# Patient Record
Sex: Female | Born: 1970
Health system: Southern US, Community
[De-identification: ages and names within clinical notes are randomized; demographics above are authoritative.]

## PROBLEM LIST (undated history)

## (undated) DIAGNOSIS — E119 Type 2 diabetes mellitus without complications: Secondary | ICD-10-CM

## (undated) DIAGNOSIS — T7840XA Allergy, unspecified, initial encounter: Secondary | ICD-10-CM

## (undated) DIAGNOSIS — G4733 Obstructive sleep apnea (adult) (pediatric): Principal | ICD-10-CM

## (undated) DIAGNOSIS — I10 Essential (primary) hypertension: Secondary | ICD-10-CM

## (undated) DIAGNOSIS — E785 Hyperlipidemia, unspecified: Secondary | ICD-10-CM

## (undated) HISTORY — PX: REDUCTION MAMMAPLASTY: SUR839

## (undated) HISTORY — DX: Essential (primary) hypertension: I10

## (undated) HISTORY — DX: Obstructive sleep apnea (adult) (pediatric): G47.33

## (undated) HISTORY — DX: Hyperlipidemia, unspecified: E78.5

## (undated) HISTORY — PX: ABDOMINAL HYSTERECTOMY: SHX81

## (undated) HISTORY — DX: Allergy, unspecified, initial encounter: T78.40XA

## (undated) HISTORY — DX: Type 2 diabetes mellitus without complications: E11.9

---

## 1997-07-25 ENCOUNTER — Other Ambulatory Visit: Admission: RE | Admit: 1997-07-25 | Discharge: 1997-07-25 | Payer: Self-pay | Admitting: Surgery

## 1998-04-21 ENCOUNTER — Other Ambulatory Visit: Admission: RE | Admit: 1998-04-21 | Discharge: 1998-04-21 | Payer: Self-pay | Admitting: Obstetrics & Gynecology

## 1999-08-07 ENCOUNTER — Other Ambulatory Visit: Admission: RE | Admit: 1999-08-07 | Discharge: 1999-08-07 | Payer: Self-pay | Admitting: Obstetrics and Gynecology

## 2000-09-01 ENCOUNTER — Other Ambulatory Visit: Admission: RE | Admit: 2000-09-01 | Discharge: 2000-09-01 | Payer: Self-pay | Admitting: Obstetrics and Gynecology

## 2001-04-15 HISTORY — PX: BREAST REDUCTION SURGERY: SHX8

## 2001-04-15 HISTORY — PX: REDUCTION MAMMAPLASTY: SUR839

## 2001-12-15 ENCOUNTER — Other Ambulatory Visit: Admission: RE | Admit: 2001-12-15 | Discharge: 2001-12-15 | Payer: Self-pay | Admitting: Obstetrics and Gynecology

## 2002-03-08 ENCOUNTER — Ambulatory Visit (HOSPITAL_BASED_OUTPATIENT_CLINIC_OR_DEPARTMENT_OTHER): Admission: RE | Admit: 2002-03-08 | Discharge: 2002-03-09 | Payer: Self-pay | Admitting: Specialist

## 2002-04-15 HISTORY — PX: TOE SURGERY: SHX1073

## 2002-09-27 ENCOUNTER — Encounter: Payer: Self-pay | Admitting: Obstetrics and Gynecology

## 2002-09-27 ENCOUNTER — Ambulatory Visit (HOSPITAL_COMMUNITY): Admission: RE | Admit: 2002-09-27 | Discharge: 2002-09-27 | Payer: Self-pay | Admitting: Obstetrics and Gynecology

## 2002-10-11 ENCOUNTER — Ambulatory Visit (HOSPITAL_COMMUNITY): Admission: RE | Admit: 2002-10-11 | Discharge: 2002-10-11 | Payer: Self-pay | Admitting: Obstetrics and Gynecology

## 2003-01-10 ENCOUNTER — Other Ambulatory Visit: Admission: RE | Admit: 2003-01-10 | Discharge: 2003-01-10 | Payer: Self-pay | Admitting: Obstetrics and Gynecology

## 2003-04-16 HISTORY — PX: PARTIAL HYSTERECTOMY: SHX80

## 2003-07-12 ENCOUNTER — Ambulatory Visit (HOSPITAL_COMMUNITY): Admission: RE | Admit: 2003-07-12 | Discharge: 2003-07-12 | Payer: Self-pay | Admitting: Obstetrics and Gynecology

## 2003-10-13 ENCOUNTER — Inpatient Hospital Stay (HOSPITAL_COMMUNITY): Admission: RE | Admit: 2003-10-13 | Discharge: 2003-10-16 | Payer: Self-pay | Admitting: Obstetrics and Gynecology

## 2004-04-03 ENCOUNTER — Other Ambulatory Visit: Admission: RE | Admit: 2004-04-03 | Discharge: 2004-04-03 | Payer: Self-pay | Admitting: Obstetrics and Gynecology

## 2005-04-04 ENCOUNTER — Other Ambulatory Visit: Admission: RE | Admit: 2005-04-04 | Discharge: 2005-04-04 | Payer: Self-pay | Admitting: Obstetrics and Gynecology

## 2010-05-09 ENCOUNTER — Other Ambulatory Visit (HOSPITAL_COMMUNITY): Payer: Self-pay | Admitting: Surgery

## 2010-05-15 ENCOUNTER — Ambulatory Visit (HOSPITAL_COMMUNITY)
Admission: RE | Admit: 2010-05-15 | Discharge: 2010-05-15 | Payer: Self-pay | Source: Home / Self Care | Attending: Surgery | Admitting: Surgery

## 2010-05-15 ENCOUNTER — Encounter
Admission: RE | Admit: 2010-05-15 | Discharge: 2010-05-15 | Payer: Self-pay | Source: Home / Self Care | Attending: Surgery | Admitting: Surgery

## 2010-05-18 ENCOUNTER — Ambulatory Visit (HOSPITAL_COMMUNITY)
Admission: RE | Admit: 2010-05-18 | Discharge: 2010-05-18 | Disposition: A | Discharge status: Home / Self Care | Payer: BC Managed Care – PPO | Source: Ambulatory Visit | Attending: Surgery | Admitting: Surgery

## 2010-05-18 DIAGNOSIS — Z1231 Encounter for screening mammogram for malignant neoplasm of breast: Secondary | ICD-10-CM

## 2012-02-05 ENCOUNTER — Other Ambulatory Visit (HOSPITAL_COMMUNITY): Payer: Self-pay | Admitting: Internal Medicine

## 2012-02-05 ENCOUNTER — Encounter: Payer: Self-pay | Admitting: Obstetrics and Gynecology

## 2012-02-05 ENCOUNTER — Ambulatory Visit (INDEPENDENT_AMBULATORY_CARE_PROVIDER_SITE_OTHER): Payer: BC Managed Care – PPO | Admitting: Obstetrics and Gynecology

## 2012-02-05 VITALS — BP 120/80 | HR 72 | Resp 16 | Ht 63.0 in | Wt 263.0 lb

## 2012-02-05 DIAGNOSIS — N898 Other specified noninflammatory disorders of vagina: Secondary | ICD-10-CM

## 2012-02-05 DIAGNOSIS — Z1231 Encounter for screening mammogram for malignant neoplasm of breast: Secondary | ICD-10-CM

## 2012-02-05 DIAGNOSIS — Z01419 Encounter for gynecological examination (general) (routine) without abnormal findings: Secondary | ICD-10-CM

## 2012-02-05 DIAGNOSIS — N899 Noninflammatory disorder of vagina, unspecified: Secondary | ICD-10-CM

## 2012-02-05 DIAGNOSIS — Z139 Encounter for screening, unspecified: Secondary | ICD-10-CM

## 2012-02-05 DIAGNOSIS — Z90710 Acquired absence of both cervix and uterus: Secondary | ICD-10-CM

## 2012-02-05 LAB — POCT WET PREP (WET MOUNT)

## 2012-02-05 LAB — HIV ANTIBODY (ROUTINE TESTING W REFLEX): HIV: NONREACTIVE

## 2012-02-05 MED ORDER — CLINDAMYCIN HCL 300 MG PO CAPS: 300.0000 mg | ORAL_CAPSULE | Freq: Every day | ORAL | Status: DC

## 2012-02-05 MED ORDER — CLOTRIMAZOLE-BETAMETHASONE 1-0.05 % EX CREA: TOPICAL_CREAM | Freq: Every day | CUTANEOUS | Status: DC

## 2012-02-05 NOTE — Progress Notes (Signed)
Contraception Hysterectomy Last pap 09/16/2008 WNL Last Mammo 2012 WNl Last Colonoscopy None Last Dexa Scan None Primary MD Dorothyann Peng Abuse at Home None  C/o vaginal irritation externally  Filed Vitals:   02/05/12 1024  BP: 120/80  Pulse: 72  Resp: 16   ROS: noncontributory  Physical Examination: General appearance - alert, well appearing, and in no distress Neck - supple, no significant adenopathy Chest - clear to auscultation, no wheezes, rales or rhonchi, symmetric air entry Heart - normal rate and regular rhythm Abdomen - soft, nontender, nondistended, no masses or organomegaly Breasts - breasts appear normal, no suspicious masses, no skin or nipple changes or axillary nodes Pelvic - normal external genitalia, vulva, vagina, adnexa Back exam - no CVAT Extremities - no edema, redness or tenderness in the calves or thighs  A/P mammo AEX 3yr Boils in armpit/hidradenitis - never tried abx chronically - trial of clindamycin Wet prep Vulvitis - lotrisone

## 2012-02-19 ENCOUNTER — Ambulatory Visit (HOSPITAL_COMMUNITY)
Admission: RE | Admit: 2012-02-19 | Discharge: 2012-02-19 | Disposition: A | Discharge status: Home / Self Care | Payer: BC Managed Care – PPO | Source: Ambulatory Visit | Attending: Internal Medicine | Admitting: Internal Medicine

## 2012-02-19 DIAGNOSIS — Z1231 Encounter for screening mammogram for malignant neoplasm of breast: Secondary | ICD-10-CM | POA: Insufficient documentation

## 2013-03-25 ENCOUNTER — Other Ambulatory Visit (HOSPITAL_COMMUNITY): Payer: Self-pay | Admitting: Internal Medicine

## 2013-03-25 DIAGNOSIS — Z1231 Encounter for screening mammogram for malignant neoplasm of breast: Secondary | ICD-10-CM

## 2013-03-26 ENCOUNTER — Ambulatory Visit (HOSPITAL_COMMUNITY)
Admission: RE | Admit: 2013-03-26 | Discharge: 2013-03-26 | Disposition: A | Discharge status: Home / Self Care | Payer: BC Managed Care – PPO | Source: Ambulatory Visit | Attending: Internal Medicine | Admitting: Internal Medicine

## 2013-03-26 DIAGNOSIS — Z1231 Encounter for screening mammogram for malignant neoplasm of breast: Secondary | ICD-10-CM

## 2014-07-06 ENCOUNTER — Other Ambulatory Visit: Payer: Self-pay

## 2014-07-06 DIAGNOSIS — Z1231 Encounter for screening mammogram for malignant neoplasm of breast: Secondary | ICD-10-CM

## 2014-07-14 ENCOUNTER — Ambulatory Visit
Admission: RE | Admit: 2014-07-14 | Discharge: 2014-07-14 | Disposition: A | Discharge status: Home / Self Care | Payer: BC Managed Care – PPO | Source: Ambulatory Visit

## 2014-07-14 DIAGNOSIS — Z1231 Encounter for screening mammogram for malignant neoplasm of breast: Secondary | ICD-10-CM

## 2015-09-06 ENCOUNTER — Other Ambulatory Visit: Payer: Self-pay

## 2015-09-06 DIAGNOSIS — Z1231 Encounter for screening mammogram for malignant neoplasm of breast: Secondary | ICD-10-CM

## 2015-09-21 ENCOUNTER — Ambulatory Visit
Admission: RE | Admit: 2015-09-21 | Discharge: 2015-09-21 | Disposition: A | Discharge status: Home / Self Care | Payer: BC Managed Care – PPO | Source: Ambulatory Visit

## 2015-09-21 DIAGNOSIS — Z1231 Encounter for screening mammogram for malignant neoplasm of breast: Secondary | ICD-10-CM

## 2016-05-16 ENCOUNTER — Other Ambulatory Visit (HOSPITAL_BASED_OUTPATIENT_CLINIC_OR_DEPARTMENT_OTHER): Payer: Self-pay

## 2016-05-16 DIAGNOSIS — G4730 Sleep apnea, unspecified: Secondary | ICD-10-CM

## 2016-05-22 ENCOUNTER — Encounter: Payer: Self-pay | Admitting: Skilled Nursing Facility1

## 2016-05-22 ENCOUNTER — Encounter: Payer: BC Managed Care – PPO | Attending: Surgery | Admitting: Skilled Nursing Facility1

## 2016-05-22 DIAGNOSIS — E669 Obesity, unspecified: Secondary | ICD-10-CM

## 2016-05-22 DIAGNOSIS — Z6841 Body Mass Index (BMI) 40.0 and over, adult: Secondary | ICD-10-CM | POA: Insufficient documentation

## 2016-05-22 NOTE — Progress Notes (Signed)
Pre-Op Assessment Visit:  Pre-Operative Roux-En-Y Bypass Surgery  Medical Nutrition Therapy:  Appt start time: 3:00  End time:  3:50  Patient was seen on 05/22/2016 for Pre-Operative Nutrition Assessment. Assessment and letter of approval faxed to [facility] coordinator on 05/22/2016.  Pt states she has had diabetes since 2008, and feels like she struggles with management: checking her blood sugar every week fasting average 170-200: Latest A1C 8.6. Pt states she Wants to start latin zumba. Pt states she has found a meal prep service.  Waist circumference: 51 Start weight at [facility]: 275.7 pounds BMI: 48.84  24 hr Dietary Recall: First Meal: none----fast food Snack: Second Meal: sandwich  Snack:  Third Meal:  Salad-----fast food Snack:  Beverages: water, gingerale, sweet tea, green tea  Encouraged to engage in 150 minutes of moderate physical activity including cardiovascular and weight baring weekly  Handouts given during visit include:  . Pre-Op Goals . Bariatric Surgery Protein Shakes During the appointment today the following Pre-Op Goals were reviewed with the patient: . Maintain or lose weight as instructed by your surgeon . Make healthy food choices . Begin to limit portion sizes . Limited concentrated sugars and fried foods . Keep fat/sugar in the single digits per serving on          food labels . Practice CHEWING your food  (aim for 30 chews per bite or until applesauce consistency) . Practice not drinking 15 minutes before, during, and 30 minutes after each meal/snack . Avoid all carbonated beverages  . Avoid/limit caffeinated beverages  . Avoid all sugar-sweetened beverages . Consume 3 meals per day; eat every 3-5 hours . Make a list of non-food related activities . Aim for 64-100 ounces of FLUID daily  . Aim for at least 60-80 grams of PROTEIN daily . Look for a liquid protein source that contain ?15 g protein and ?5 g  carbohydrate  (ex: shakes, drinks, shots)  -Follow diet recommendations listed below   Energy and Macronutrient Recomendations: Calories: 1500 Carbohydrate: 170 Protein: 112 Fat: 42  Demonstrated degree of understanding via:  Teach Back  Teaching Method Utilized:  Visual Auditory Hands on  Barriers to learning/adherence to lifestyle change: none identified  Patient to call the Nutrition and Diabetes Education Services to enroll in Pre-Op and Post-Op Nutrition Education when surgery date is scheduled.

## 2016-05-22 NOTE — Patient Instructions (Addendum)
Follow Pre-Op Goals Try Protein Shakes Call [facility] at [phone] when surgery is scheduled to enroll in Pre-Op Class  Things to remember:  Please always be honest with us. We want to support you!  If you have any questions or concerns in between appointments, please call or email   The diet after surgery will be high protein and low in carbohydrate.  Vitamins and calcium need to be taken for the rest of your life.  Feel free to include support people in any classes or appointments. -Any time you feel funny check your sugar: if below 70 have fast acting sugar if high (above 200) go for a walk or drink 16 ounces of water   Supplement recommendations:  Before Surgery   1 Complete Multivitamin with Iron  3000 IU Vitamin D3  After Surgery   2 Chewable Multivitamins  **Best Choice - Bariatric Advantage Advanced Multi EA      3 Chewable Calcium (500 mg each, total 1200-1500 mg per day)  **Best Choice - Celebrate, Bariatric Advantage, or Wellesse  Other Options:    2 Flinstones Complete + up to 100 mg Thiamin + 2000-3000 IU Vitamin D3 + 350-500 mcg Vitamin B12 + 30-45 mg Iron (with history of deficiency)  2 Celebrate MultiComplete with 18 mg Iron (this provides 6000 IU of  Vitamin D3)  4 Celebrate Essential Multi 2 in 1 (has calcium) + 18-60 mg separate  iron  Vitamins and Calcium are available at:   [facility]   [address], [place], [place] [address]   [email or web address]  [email or web address]  [email or web address]

## 2016-06-11 ENCOUNTER — Encounter (HOSPITAL_BASED_OUTPATIENT_CLINIC_OR_DEPARTMENT_OTHER): Payer: BC Managed Care – PPO

## 2016-07-01 ENCOUNTER — Encounter: Payer: Self-pay | Admitting: Pulmonary Disease

## 2016-07-01 ENCOUNTER — Ambulatory Visit (INDEPENDENT_AMBULATORY_CARE_PROVIDER_SITE_OTHER): Payer: BC Managed Care – PPO | Admitting: Pulmonary Disease

## 2016-07-01 VITALS — BP 122/90 | HR 105 | Ht 63.0 in | Wt 278.0 lb

## 2016-07-01 DIAGNOSIS — R0683 Snoring: Secondary | ICD-10-CM

## 2016-07-01 NOTE — Progress Notes (Signed)
She is being evaluated assessed for bariatric surgery.  She has sleep study scheduled for 07/03/16.  She denies any other specifics sleep complaints.   Review of systems  Constitutional: Negative for fever and unexpected weight change.  HENT: Negative for congestion, dental problem, ear pain, nosebleeds, postnasal drip, rhinorrhea, sinus pressure, sneezing, sore throat and trouble swallowing.   Eyes: Negative for redness and itching.  Respiratory: Negative for cough, chest tightness, shortness of breath and wheezing.   Cardiovascular: Negative for palpitations and leg swelling.  Gastrointestinal: Negative for nausea and vomiting.  Genitourinary: Negative for dysuria.  Musculoskeletal: Negative for joint swelling.  Skin: Negative for rash.  Neurological: Negative for headaches.  Hematological: Does not bruise/bleed easily.  Psychiatric/Behavioral: Negative for dysphoric mood. The patient is not nervous/anxious.     Plan: Will cancel this appt and refund her copay Will await sleep test results >> if this shows sleep apnea, will call to reschedule evaluation  Coralyn HellingVineet Desiderio Dolata, MD [facility] 07/01/2016, 10:53 AM Pager:  [phone] After 3pm call: [phone]

## 2016-07-01 NOTE — Progress Notes (Signed)
   Subjective:    Patient ID: Sue Bell, female    DOB: 10-22-1970, 46 y.o.   MRN: [id number]  HPI    Review of Systems  Constitutional: Negative for fever and unexpected weight change.  HENT: Negative for congestion, dental problem, ear pain, nosebleeds, postnasal drip, rhinorrhea, sinus pressure, sneezing, sore throat and trouble swallowing.   Eyes: Negative for redness and itching.  Respiratory: Negative for cough, chest tightness, shortness of breath and wheezing.   Cardiovascular: Negative for palpitations and leg swelling.  Gastrointestinal: Negative for nausea and vomiting.  Genitourinary: Negative for dysuria.  Musculoskeletal: Negative for joint swelling.  Skin: Negative for rash.  Neurological: Negative for headaches.  Hematological: Does not bruise/bleed easily.  Psychiatric/Behavioral: Negative for dysphoric mood. The patient is not nervous/anxious.        Objective:   Physical Exam        Assessment & Plan:

## 2016-07-03 ENCOUNTER — Ambulatory Visit (HOSPITAL_BASED_OUTPATIENT_CLINIC_OR_DEPARTMENT_OTHER): Payer: BC Managed Care – PPO | Attending: Surgery | Admitting: Internal Medicine

## 2016-07-03 DIAGNOSIS — G4736 Sleep related hypoventilation in conditions classified elsewhere: Secondary | ICD-10-CM | POA: Insufficient documentation

## 2016-07-03 DIAGNOSIS — E119 Type 2 diabetes mellitus without complications: Secondary | ICD-10-CM | POA: Diagnosis not present

## 2016-07-03 DIAGNOSIS — E669 Obesity, unspecified: Secondary | ICD-10-CM | POA: Insufficient documentation

## 2016-07-03 DIAGNOSIS — Z6841 Body Mass Index (BMI) 40.0 and over, adult: Secondary | ICD-10-CM | POA: Diagnosis not present

## 2016-07-03 DIAGNOSIS — G4730 Sleep apnea, unspecified: Secondary | ICD-10-CM

## 2016-07-03 DIAGNOSIS — G4733 Obstructive sleep apnea (adult) (pediatric): Secondary | ICD-10-CM | POA: Diagnosis not present

## 2016-07-03 DIAGNOSIS — I10 Essential (primary) hypertension: Secondary | ICD-10-CM | POA: Diagnosis not present

## 2016-07-07 DIAGNOSIS — G4730 Sleep apnea, unspecified: Secondary | ICD-10-CM | POA: Diagnosis not present

## 2016-07-07 NOTE — Procedures (Signed)
    Patient Name: Sue Bell, Sue Bell Study Date: 07/03/2016 Gender: Female D.O.B: 1971/03/18 Age (years): [age] Referring Provider: Phylliss Blakeshelsea Connor MD Height (inches): 63 Interpreting Physician: Jetty Duhamellinton Grover Robinson MD, ABSM Weight (lbs): 278 RPSGT: Shelah LewandowskyGregory, [name] BMI: 49 MRN: [id number] Neck Size: 16.00 CLINICAL INFORMATION Sleep Study Type: NPSG  Indication for sleep study: Diabetes, Hypertension, Obesity, Snoring  Epworth Sleepiness Score: 3  SLEEP STUDY TECHNIQUE As per the AASM Manual for the Scoring of Sleep and Associated Events v2.3 (April 2016) with a hypopnea requiring 4% desaturations.  The channels recorded and monitored were frontal, central and occipital EEG, electrooculogram (EOG), submentalis EMG (chin), nasal and oral airflow, thoracic and abdominal wall motion, anterior tibialis EMG, snore microphone, electrocardiogram, and pulse oximetry.  MEDICATIONS Medications self-administered by patient taken the night of the study : none reported  SLEEP ARCHITECTURE The study was initiated at 10:36:21 PM and ended at 4:57:04 AM.  Sleep onset time was 50.3 minutes and the sleep efficiency was 75.8%. The total sleep time was 288.5 minutes.  Stage REM latency was 85.0 minutes.  The patient spent 3.99% of the night in stage N1 sleep, 66.90% in stage N2 sleep, 0.00% in stage N3 and 29.12% in REM.  Alpha intrusion was absent.  Supine sleep was 25.48%.  RESPIRATORY PARAMETERS The overall apnea/hypopnea index (AHI) was 27.9 per hour. There were 22 total apneas, including 22 obstructive, 0 central and 0 mixed apneas. There were 112 hypopneas and 24 RERAs.  The AHI during Stage REM sleep was 90.0 per hour.  AHI while supine was 54.7 per hour.  The mean oxygen saturation was 92.25%. The minimum SpO2 during sleep was 77.00%.  Soft snoring was noted during this study.  CARDIAC DATA The 2 lead EKG demonstrated sinus rhythm. The mean heart rate was 93.41 beats per minute. Other  EKG findings include: None.  LEG MOVEMENT DATA The total PLMS were 0 with a resulting PLMS index of 0.00. Associated arousal with leg movement index was 0.0 .  IMPRESSIONS - Moderate obstructive sleep apnea occurred during this study (AHI = 27.9/h). - No significant central sleep apnea occurred during this study (CAI = 0.0/h). - Moderate oxygen desaturation was noted during this study (Min O2 = 77.00%). - Insufficient early events to meet protocol requirements for split CPAP titration - The patient snored with Soft snoring volume. - No cardiac abnormalities were noted during this study. - Clinically significant periodic limb movements did not occur during sleep. No significant associated arousals.  DIAGNOSIS - Obstructive Sleep Apnea (327.23 [G47.33 ICD-10]) - Nocturnal Hypoxemia (327.26 [G47.36 ICD-10])  RECOMMENDATIONS - Therapeutic CPAP titration to determine optimal pressure required to alleviate sleep disordered breathing. - Positional therapy avoiding supine position during sleep. - Avoid alcohol, sedatives and other CNS depressants that may worsen sleep apnea and disrupt normal sleep architecture. - Sleep hygiene should be reviewed to assess factors that may improve sleep quality. - Weight management and regular exercise should be initiated or continued if appropriate.  [Electronically signed] 07/07/2016 12:31 PM  Jetty Duhamellinton Karter Haire MD, ABSM Diplomate, American Board of Sleep Medicine   NPI: [id number] Waymon BudgeYOUNG,Earlie Schank D Diplomate, American Board of Sleep Medicine  ELECTRONICALLY SIGNED ON:  07/07/2016, 12:30 PM [facility] PH: [phone]   FX: [phone] ACCREDITED BY THE AMERICAN ACADEMY OF SLEEP MEDICINE

## 2016-07-08 ENCOUNTER — Telehealth: Payer: Self-pay | Admitting: Pulmonary Disease

## 2016-07-08 ENCOUNTER — Encounter: Payer: Self-pay | Admitting: Pulmonary Disease

## 2016-07-08 DIAGNOSIS — G4733 Obstructive sleep apnea (adult) (pediatric): Secondary | ICD-10-CM

## 2016-07-08 HISTORY — DX: Obstructive sleep apnea (adult) (pediatric): G47.33

## 2016-07-08 NOTE — Telephone Encounter (Signed)
PSG 07/03/16 >> AHI 27.9, SaO2 low 77%.  Can you please call pt to inform her that sleep study showed moderate sleep apnea.  Please reschedule consult visit with me.  Okay to double book visit.

## 2016-07-19 ENCOUNTER — Other Ambulatory Visit (HOSPITAL_COMMUNITY): Payer: Self-pay | Admitting: Surgery

## 2016-07-19 NOTE — Telephone Encounter (Signed)
LM x 1 

## 2016-07-25 ENCOUNTER — Ambulatory Visit (HOSPITAL_COMMUNITY): Payer: BC Managed Care – PPO

## 2016-07-26 NOTE — Telephone Encounter (Signed)
Patient returned phone call, patient contact # [phone].Marland KitchenCharm Rings

## 2016-07-26 NOTE — Telephone Encounter (Signed)
LM x 2

## 2016-07-26 NOTE — Telephone Encounter (Signed)
Called and spoke with pt and she is aware of results per VS>  Pt scheduled per VS to over book his schedule on 5/2 at  9.

## 2016-08-02 ENCOUNTER — Other Ambulatory Visit (HOSPITAL_COMMUNITY): Payer: Self-pay | Admitting: Surgery

## 2016-08-02 ENCOUNTER — Ambulatory Visit (HOSPITAL_COMMUNITY)
Admission: RE | Admit: 2016-08-02 | Discharge: 2016-08-02 | Disposition: A | Discharge status: Home / Self Care | Payer: BC Managed Care – PPO | Source: Ambulatory Visit | Attending: Surgery | Admitting: Surgery

## 2016-08-02 ENCOUNTER — Other Ambulatory Visit: Payer: Self-pay

## 2016-08-02 DIAGNOSIS — K449 Diaphragmatic hernia without obstruction or gangrene: Secondary | ICD-10-CM | POA: Insufficient documentation

## 2016-08-14 ENCOUNTER — Encounter: Payer: Self-pay | Admitting: Pulmonary Disease

## 2016-08-14 ENCOUNTER — Ambulatory Visit (INDEPENDENT_AMBULATORY_CARE_PROVIDER_SITE_OTHER): Payer: BC Managed Care – PPO | Admitting: Pulmonary Disease

## 2016-08-14 VITALS — BP 132/80 | HR 104 | Ht 63.0 in | Wt 276.6 lb

## 2016-08-14 DIAGNOSIS — G4733 Obstructive sleep apnea (adult) (pediatric): Secondary | ICD-10-CM | POA: Diagnosis not present

## 2016-08-14 NOTE — Patient Instructions (Signed)
Will arrange for CPAP set up Follow up in 2 months 

## 2016-08-14 NOTE — Progress Notes (Signed)
   Subjective:    Patient ID: Regina Eck, female    DOB: 11/06/1970, 46 y.o.   MRN: [id number]  HPI    Review of Systems  Constitutional: Negative for fever and unexpected weight change.  HENT: Negative for congestion, dental problem, ear pain, nosebleeds, postnasal drip, rhinorrhea, sinus pressure, sneezing, sore throat and trouble swallowing.   Eyes: Negative for redness and itching.  Respiratory: Negative for cough, chest tightness, shortness of breath and wheezing.   Cardiovascular: Negative for palpitations and leg swelling.  Gastrointestinal: Negative for nausea and vomiting.  Genitourinary: Negative for dysuria.  Musculoskeletal: Negative for joint swelling.  Skin: Negative for rash.  Neurological: Negative for headaches.  Hematological: Does not bruise/bleed easily.  Psychiatric/Behavioral: Negative for dysphoric mood. The patient is not nervous/anxious.        Objective:   Physical Exam        Assessment & Plan:

## 2016-08-14 NOTE — Progress Notes (Signed)
Current Outpatient Prescriptions on File Prior to Visit  Medication Sig  . cetirizine (ZYRTEC) 10 MG tablet Take 10 mg by mouth daily.  Marland Kitchen LEVEMIR FLEXTOUCH 100 UNIT/ML Pen Inject 30 Units as directed daily.  . Loratadine 10 MG CAPS Take 1 capsule by mouth daily.  . pravastatin (PRAVACHOL) 40 MG tablet Take 1 tablet by mouth daily.  . TRULICITY 1.5 MG/0.5ML SOPN Inject whole pen weekly  . valsartan (DIOVAN) 160 MG tablet Take 1 tablet by mouth daily.   No current facility-administered medications on file prior to visit.      Chief Complaint  Patient presents with  . Sleep Consult    Pt back to review sleep study -- last appt was cancelled d/t to not having sleep study completed. Epworth: 4    Review of Systems  Constitutional: Negative for fever and unexpected weight change.  HENT: Negative for congestion, dental problem, ear pain, nosebleeds, postnasal drip, rhinorrhea, sinus pressure, sneezing, sore throat and trouble swallowing.   Eyes: Negative for redness and itching.  Respiratory: Negative for cough, chest tightness, shortness of breath and wheezing.   Cardiovascular: Negative for palpitations and leg swelling.  Gastrointestinal: Negative for nausea and vomiting.  Genitourinary: Negative for dysuria.  Musculoskeletal: Negative for joint swelling.  Skin: Negative for rash.  Neurological: Negative for headaches.  Hematological: Does not bruise/bleed easily.  Psychiatric/Behavioral: Negative for dysphoric mood. The patient is not nervous/anxious.    Sleep tests PSG 07/03/16 >> AHI 27.9, SpO2 low 77%  Past medical history DM, HLD, HTN  Past surgical history, Family history, Social history, Allergies all reviewed.  Vital Signs BP 132/80 (BP Location: Left Arm, Cuff Size: Large)   Pulse (!) 104   Ht  (1.6 m)   Wt 276 lb 9.6 oz (125.5 kg)   SpO2 96%   BMI 49.00 kg/m   History of Present Illness Sue Bell is a 46 y.o. female with obstructive sleep  apnea.  She had sleep study.  This showed moderate OSA.  She is being set up for bariatric surgery.  Physical Exam  General - No distress ENT - No sinus tenderness, no oral exudate, no LAN, MP 4, enlarged tongue Cardiac - s1s2 regular, no murmur Chest - No wheeze/rales/dullness Back - No focal tenderness Abd - Soft, non-tender Ext - No edema Neuro - Normal strength Skin - No rashes Psych - normal mood, and behavior   Assessment/Plan  Obstructive sleep apnea. - We discussed how sleep apnea can affect various health problems, including risks for hypertension, cardiovascular disease, and diabetes.  We also discussed how sleep disruption can increase risks for accidents, such as while driving.  Weight loss as a means of improving sleep apnea was also reviewed.  Additional treatment options discussed were CPAP therapy, oral appliance, and surgical intervention. - will arrange for auto CPAP   Patient Instructions  Will arrange for CPAP set up  Follow up in 2 months   Time spent 26 minutes  Coralyn Helling, MD [place] Pulmonary/Critical Care/Sleep Pager:  [phone] 08/14/2016, 9:45 AM

## 2016-08-19 ENCOUNTER — Other Ambulatory Visit: Payer: Self-pay | Admitting: Internal Medicine

## 2016-08-19 DIAGNOSIS — Z1231 Encounter for screening mammogram for malignant neoplasm of breast: Secondary | ICD-10-CM

## 2016-09-27 NOTE — Progress Notes (Signed)
Please place orders in EPIC as patient is being scheduled for a pre-op appointment! Thank you! 

## 2016-10-01 ENCOUNTER — Ambulatory Visit
Admission: RE | Admit: 2016-10-01 | Discharge: 2016-10-01 | Disposition: A | Discharge status: Home / Self Care | Payer: BC Managed Care – PPO | Source: Ambulatory Visit | Attending: Internal Medicine | Admitting: Internal Medicine

## 2016-10-01 DIAGNOSIS — Z1231 Encounter for screening mammogram for malignant neoplasm of breast: Secondary | ICD-10-CM

## 2016-10-03 ENCOUNTER — Ambulatory Visit: Payer: Self-pay | Admitting: Surgery

## 2016-10-03 NOTE — H&P (Signed)
Sue Bell 10/03/2016 9:44 AM Location: [facility] Patient #: [id number] DOB: December 31, 1970 Single / Language: Sue Bell / Race: Black or African American Female  History of Present Illness (Sue Maultsby A. Kae Heller Bell; 10/03/2016 9:58 AM) Patient words: She has completed her preoperative workup. Her chest x-ray was negative. Her upper GI did demonstrate a small sliding type hiatal hernia but no reflux. Her sleep study did demonstrate moderate obstructive sleep apnea and CPAP titration was recommended; she met with Dr. Halford Bell in pulmonology on May 2 in their plan was to arrange her CPAP set up. She has met with nutritionist and psychologist and has been deemed an appropriate candidate from their standpoint. Her preoperative labs including vitamin levels were all unremarkable with Sue exception of her hemoglobin A1c which was 7.6. She does report that she had this rechecked earlier this month and it had gone up again to 8.9. At our initial visit she had been leaning more towards Sue sleeve, however now that her hemoglobin A1c has trended up she is interested in whether she is still a candidate for bypass. She has her preoperative class Sue nutrition team this Monday. From initial Visit 05/08/16: She was here in 2012 and initiated workup for Band placement but this fell by Sue wayside for various reasons. She has now had 2 family members undergo bariatric surgery, one sleeve and one bypass. They have both done well. She is unsure which procedure she prefers but is leaning toward sleeve. She has been considering this for about a year and came to our info session in december. Sue patient is a 46 year old female who presents for a bariatric surgery evaluation. Associated symptoms include joint pains. Initial onset of obesity was during adolescence. Initial presentation included inability to lose weight, hypertension and diabetes. Disease complications include diabetes, hypertension, sleep apnea and  osteoarthritis. Current diet includes well balanced meals. walking. Sue patient is currently able to do activities of daily living without limitations and able to work without limitations. Past treatment has included diabetic diet, weight loss group and low carbohydrate diet. Surgical history: C-section - elective and hysterectomy. Gastrointestinal History: Patient denies heartburn or dysphagia. MBSQIP recognized comorbidities: Patient has hypertension, diabetes mellitus and sleep apnea. She works as an Glass blower/designer at [employer]. Nonsmoker.  Sue patient is a 46 year old female.   Past Surgical History Sue Bell, Sue Bell; 10/03/2016 9:44 AM) Cesarean Section - 1 Foot Surgery Left. Hysterectomy (not due to cancer) - Partial Mammoplasty; Reduction Bilateral. Oral Surgery  Diagnostic Studies History Sue Bell, Sue Bell; 10/03/2016 9:44 AM) Colonoscopy never Mammogram within last year Pap Smear 1-5 years ago  Allergies Sue Bell, Sue Bell; 10/03/2016 9:47 AM) Sulfa Antibiotics Hives Allergies Reconciled  Medication History Sue Bell, Bell; 10/03/2016 9:49 AM) Levemir FlexTouch (100UNIT/ML Soln Pen-inj, Subcutaneous) Active. Pravastatin Sodium (40MG Tablet, Oral) Active. Valsartan (160MG Tablet, Oral) Active. Trulicity (0.4VW/0.9WJ Soln Pen-inj, Subcutaneous) Active. ZyrTEC Allergy (10MG Tablet, Oral) Active. Medications Reconciled  Social History Sue Bell, Sue Bell; 10/03/2016 9:44 AM) Alcohol use Occasional alcohol use. Caffeine use Carbonated beverages, Tea. No drug use Tobacco use Never smoker.  Family History Sue Bell, Sue Bell; 10/03/2016 9:44 AM) Alcohol Abuse Father. Arthritis Sister. Breast Cancer Sister. Diabetes Mellitus Father. Heart Disease Mother. Heart disease in female family member before age 3 Hypertension Father. Prostate Cancer Father.  Pregnancy / Birth History Sue Bell, Sue Bell; 10/03/2016 9:44 AM) Age at menarche 14 years, 11  years. Contraceptive History Depo-provera, Oral contraceptives. Gravida 4 3 Maternal age 65-20 Para 48  Other Problems Sue Bell, Sue Bell; 10/03/2016 9:44 AM) Diabetes Mellitus High blood pressure Hypercholesterolemia     Review of Systems Sue Bell; 10/03/2016 9:44 AM) General Not Present- Appetite Loss, Chills, Fatigue, Fever, Night Sweats, Weight Gain and Weight Loss. Skin Not Present- Change in Wart/Mole, Dryness, Hives, Jaundice, New Lesions, Non-Healing Wounds, Rash and Ulcer. HEENT Present- Seasonal Allergies and Wears glasses/contact lenses. Not Present- Earache, Hearing Loss, Hoarseness, Nose Bleed, Oral Ulcers, Ringing in Sue Ears, Sinus Pain, Sore Throat, Visual Disturbances and Yellow Eyes. Respiratory Not Present- Bloody sputum, Chronic Cough, Difficulty Breathing, Snoring and Wheezing. Breast Not Present- Breast Mass, Breast Pain, Nipple Discharge and Skin Changes. Cardiovascular Not Present- Chest Pain, Difficulty Breathing Lying Down, Leg Cramps, Palpitations, Rapid Heart Rate, Shortness of Breath and Swelling of Extremities. Gastrointestinal Not Present- Abdominal Pain, Bloating, Bloody Stool, Change in Bowel Habits, Chronic diarrhea, Constipation, Difficulty Swallowing, Excessive gas, Gets full quickly at meals, Hemorrhoids, Indigestion, Nausea, Rectal Pain and Vomiting. Female Genitourinary Not Present- Frequency, Nocturia, Painful Urination, Pelvic Pain and Urgency. Musculoskeletal Not Present- Back Pain, Joint Pain, Joint Stiffness, Muscle Pain, Muscle Weakness and Swelling of Extremities. Neurological Not Present- Decreased Memory, Fainting, Headaches, Numbness, Seizures, Tingling, Tremor, Trouble walking and Weakness. Psychiatric Not Present- Anxiety, Bipolar, Change in Sleep Pattern, Depression, Fearful and Frequent crying. Endocrine Not Present- Cold Intolerance, Excessive Hunger, Hair Changes, Heat Intolerance, Hot flashes and New Diabetes. Hematology  Not Present- Blood Thinners, Easy Bruising, Excessive bleeding, Gland problems, HIV and Persistent Infections.  Vitals Sue Bell; 10/03/2016 9:45 AM) 10/03/2016 9:45 AM Weight: 277.8 lb Height: 63in Body Surface Area: 2.22 m Body Mass Index: 49.21 kg/m  Temp.: 98.55F  Pulse: 119 (Regular)  P.OX: 95% (Room air) BP: 128/88 (Sitting, Left Arm, Standard)      Physical Exam (Anedra Penafiel A. Kae Heller Bell; 10/03/2016 10:00 AM)  General Note: She is alert and well-appearing  Integumentary Note: No lesions or rashes on limited skin exam  Head and Neck Note: No mass or thyromegaly  Eye Note: Anicteric, extraocular motions intact  ENMT Note: Moist mucous membranes, good dentition  Chest and Lung Exam Note: Unlabored respirations, symmetrical air entry  Cardiovascular Note: Regular rate and rhythm, no pedal edema  Abdomen Note: Obese, nontender. No palpable abnormality  Neurologic Note: Grossly intact, normal gait  Neuropsychiatric Note: Normal mood and affect, appropriate insight  Musculoskeletal Note: Strength symmetrical throughout, no deformity    Assessment & Plan (Synda Bagent A. Kae Heller Bell; 10/03/2016 10:03 AM)  MORBID OBESITY (E66.01) Story: She has progressed through Sue pathway with no obstacles identified. With her increased HgA1c she has been considering bypass which I agree would be more likely to benefit her form Sue diabetes standpoint, as I advised her at our initial appointment. We will request insurance authorization for bypass. If approved plan to proceed as scheduled on July 3. From January: wt 275, BMI 47. She is leaning toward sleeve. We discussed Sue merits of both sleeve and bypass; given her diabetes which of late has been less manageable she may benefit more from bypass, but I do think that Sue weight loss from sleeve will also improve this parameter. We will re-initiate Sue pathway at this point. she will continue do do some  researching and we'll discuss at her next appt whether she'll undergo sleeve or bypass.

## 2016-10-07 ENCOUNTER — Encounter: Payer: BC Managed Care – PPO | Attending: Surgery | Admitting: Skilled Nursing Facility1

## 2016-10-07 DIAGNOSIS — Z713 Dietary counseling and surveillance: Secondary | ICD-10-CM | POA: Insufficient documentation

## 2016-10-07 DIAGNOSIS — E669 Obesity, unspecified: Secondary | ICD-10-CM

## 2016-10-07 NOTE — Patient Instructions (Addendum)
Sue Bell  10/07/2016   Your procedure is scheduled on: 10-15-16  Report to [facility] Main  Entrance Take [place]  elevators to 3rd floor to  Short Stay Center at [phone].    Call this number if you have problems the morning of surgery [phone]    Remember: ONLY 1 PERSON MAY GO WITH YOU TO SHORT STAY TO GET  READY MORNING OF YOUR SURGERY.  Do not eat food or drink liquids :After Midnight.    Remember to only bring your CPAP mask and tubing. The device will be provided for you!   Take these medicines the morning of surgery with A SIP OF WATER: cetirizine(zyrtec) as needed, nasal spray as needed                                You may not have any metal on your body including hair pins and              piercings  Do not wear jewelry, make-up, lotions, powders or perfumes, deodorant             Do not wear nail polish.  Do not shave  48 hours prior to surgery.                 Do not bring valuables to the hospital. [facility] IS NOT             RESPONSIBLE   FOR VALUABLES.  Contacts, dentures or bridgework may not be worn into surgery.  Leave suitcase in the car. After surgery it may be brought to your room.               Please read over the following fact sheets you were given: _____________________________________________________________________             [facility] - Preparing for Surgery Before surgery, you can play an important role.  Because skin is not sterile, your skin needs to be as free of germs as possible.  You can reduce the number of germs on your skin by washing with CHG (chlorahexidine gluconate) soap before surgery.  CHG is an antiseptic cleaner which kills germs and bonds with the skin to continue killing germs even after washing. Please DO NOT use if you have an allergy to CHG or antibacterial soaps.  If your skin becomes reddened/irritated stop using the CHG and inform your nurse when you arrive at Short Stay. Do not shave  (including legs and underarms) for at least 48 hours prior to the first CHG shower.  You may shave your face/neck. Please follow these instructions carefully:  1.  Shower with CHG Soap the night before surgery and the  morning of Surgery.  2.  If you choose to wash your hair, wash your hair first as usual with your  normal  shampoo.  3.  After you shampoo, rinse your hair and body thoroughly to remove the  shampoo.                           4.  Use CHG as you would any other liquid soap.  You can apply chg directly  to the skin and wash  Gently with a scrungie or clean washcloth.  5.  Apply the CHG Soap to your body ONLY FROM THE NECK DOWN.   Do not use on face/ open                           Wound or open sores. Avoid contact with eyes, ears mouth and genitals (private parts).                       Wash face,  Genitals (private parts) with your normal soap.             6.  Wash thoroughly, paying special attention to the area where your surgery  will be performed.  7.  Thoroughly rinse your body with warm water from the neck down.  8.  DO NOT shower/wash with your normal soap after using and rinsing off  the CHG Soap.                9.  Pat yourself dry with a clean towel.            10.  Wear clean pajamas.            11.  Place clean sheets on your bed the night of your first shower and do not  sleep with pets. Day of Surgery : Do not apply any lotions/deodorants the morning of surgery.  Please wear clean clothes to the hospital/surgery center.  FAILURE TO FOLLOW THESE INSTRUCTIONS MAY RESULT IN THE CANCELLATION OF YOUR SURGERY   How to Manage Your Diabetes Before and After Surgery  Why is it important to control my blood sugar before and after surgery? . Improving blood sugar levels before and after surgery helps healing and can limit problems. . A way of improving blood sugar control is eating a healthy diet by: o  Eating less sugar and carbohydrates o   Increasing activity/exercise o  Talking with your doctor about reaching your blood sugar goals . High blood sugars (greater than 180 mg/dL) can raise your risk of infections and slow your recovery, so you will need to focus on controlling your diabetes during the weeks before surgery. . Make sure that the doctor who takes care of your diabetes knows about your planned surgery including the date and location.  How do I manage my blood sugar before surgery? . Check your blood sugar at least 4 times a day, starting 2 days before surgery, to make sure that the level is not too high or low. o Check your blood sugar the morning of your surgery when you wake up and every 2 hours until you get to the Short Stay unit. . If your blood sugar is less than 70 mg/dL, you will need to treat for low blood sugar: o Do not take insulin. o Treat a low blood sugar (less than 70 mg/dL) with  cup of clear juice (cranberry or apple), 4 glucose tablets, OR glucose gel. o Recheck blood sugar in 15 minutes after treatment (to make sure it is greater than 70 mg/dL). If your blood sugar is not greater than 70 mg/dL on recheck, call [phone] for further instructions. . Report your blood sugar to the short stay nurse when you get to Short Stay.  . If you are admitted to the hospital after surgery: o Your blood sugar will be checked by the staff and you will probably be given insulin after surgery (instead  of oral diabetes medicines) to make sure you have good blood sugar levels. o The goal for blood sugar control after surgery is 80-180 mg/dL.   WHAT DO I DO ABOUT MY DIABETES MEDICATION?    . THE DAY BEFORE SURGERY, only take 25 UNITS of LEVEMIR INSULIN in the evening.        Patient Signature:  Date:   Nurse Signature:  Date:   Reviewed and Endorsed by [facility] Patient Education Committee, August 2015  Incentive Spirometer  An incentive spirometer is a tool that can help keep your lungs clear and  active. This tool measures how well you are filling your lungs with each breath. Taking long deep breaths may help reverse or decrease the chance of developing breathing (pulmonary) problems (especially infection) following:  A long period of time when you are unable to move or be active. BEFORE THE PROCEDURE   If the spirometer includes an indicator to show your best effort, your nurse or respiratory therapist will set it to a desired goal.  If possible, sit up straight or lean slightly forward. Try not to slouch.  Hold the incentive spirometer in an upright position. INSTRUCTIONS FOR USE  1. Sit on the edge of your bed if possible, or sit up as far as you can in bed or on a chair. 2. Hold the incentive spirometer in an upright position. 3. Breathe out normally. 4. Place the mouthpiece in your mouth and seal your lips tightly around it. 5. Breathe in slowly and as deeply as possible, raising the piston or the ball toward the top of the column. 6. Hold your breath for 3-5 seconds or for as long as possible. Allow the piston or ball to fall to the bottom of the column. 7. Remove the mouthpiece from your mouth and breathe out normally. 8. Rest for a few seconds and repeat Steps 1 through 7 at least 10 times every 1-2 hours when you are awake. Take your time and take a few normal breaths between deep breaths. 9. The spirometer may include an indicator to show your best effort. Use the indicator as a goal to work toward during each repetition. 10. After each set of 10 deep breaths, practice coughing to be sure your lungs are clear. If you have an incision (the cut made at the time of surgery), support your incision when coughing by placing a pillow or rolled up towels firmly against it. Once you are able to get out of bed, walk around indoors and cough well. You may stop using the incentive spirometer when instructed by your caregiver.  RISKS AND COMPLICATIONS  Take your time so you do not get  dizzy or light-headed.  If you are in pain, you may need to take or ask for pain medication before doing incentive spirometry. It is harder to take a deep breath if you are having pain. AFTER USE  Rest and breathe slowly and easily.  It can be helpful to keep track of a log of your progress. Your caregiver can provide you with a simple table to help with this. If you are using the spirometer at home, follow these instructions: SEEK MEDICAL CARE IF:   You are having difficultly using the spirometer.  You have trouble using the spirometer as often as instructed.  Your pain medication is not giving enough relief while using the spirometer.  You develop fever of 100.5 F (38.1 C) or higher. SEEK IMMEDIATE MEDICAL CARE IF:   You cough  up bloody sputum that had not been present before.  You develop fever of 102 F (38.9 C) or greater.  You develop worsening pain at or near the incision site. MAKE SURE YOU:   Understand these instructions.  Will watch your condition.  Will get help right away if you are not doing well or get worse. Document Released: 08/12/2006 Document Revised: 06/24/2011 Document Reviewed: 10/13/2006 [facility] Patient Information 2014 [place], [place].   ________________________________________________________________________

## 2016-10-07 NOTE — Progress Notes (Signed)
EKG 08-02-16 epic CXR 08-02-16 epic

## 2016-10-09 ENCOUNTER — Encounter (HOSPITAL_COMMUNITY)
Admission: RE | Admit: 2016-10-09 | Discharge: 2016-10-09 | Disposition: A | Discharge status: Home / Self Care | Payer: BC Managed Care – PPO | Source: Ambulatory Visit | Attending: Surgery | Admitting: Surgery

## 2016-10-09 ENCOUNTER — Encounter (HOSPITAL_COMMUNITY): Payer: Self-pay

## 2016-10-09 DIAGNOSIS — Z01812 Encounter for preprocedural laboratory examination: Secondary | ICD-10-CM | POA: Diagnosis not present

## 2016-10-09 DIAGNOSIS — Z6841 Body Mass Index (BMI) 40.0 and over, adult: Secondary | ICD-10-CM | POA: Insufficient documentation

## 2016-10-09 LAB — CBC WITH DIFFERENTIAL/PLATELET
Basophils Absolute: 0 10*3/uL (ref 0.0–0.1)
Basophils Relative: 0 %
EOS ABS: 0.1 10*3/uL (ref 0.0–0.7)
EOS PCT: 2 %
HCT: 35.8 % — ABNORMAL LOW (ref 36.0–46.0)
Hemoglobin: 11.8 g/dL — ABNORMAL LOW (ref 12.0–15.0)
LYMPHS ABS: 2.7 10*3/uL (ref 0.7–4.0)
Lymphocytes Relative: 33 %
MCH: 26.9 pg (ref 26.0–34.0)
MCHC: 33 g/dL (ref 30.0–36.0)
MCV: 81.5 fL (ref 78.0–100.0)
Monocytes Absolute: 0.3 10*3/uL (ref 0.1–1.0)
Monocytes Relative: 3 %
Neutro Abs: 5.2 10*3/uL (ref 1.7–7.7)
Neutrophils Relative %: 62 %
Platelets: 342 10*3/uL (ref 150–400)
RBC: 4.39 MIL/uL (ref 3.87–5.11)
RDW: 14.2 % (ref 11.5–15.5)
WBC: 8.3 10*3/uL (ref 4.0–10.5)

## 2016-10-09 LAB — COMPREHENSIVE METABOLIC PANEL
ALK PHOS: 72 U/L (ref 38–126)
ALT: 21 U/L (ref 14–54)
AST: 18 U/L (ref 15–41)
Albumin: 3.8 g/dL (ref 3.5–5.0)
Anion gap: 10 (ref 5–15)
BUN: 16 mg/dL (ref 6–20)
CALCIUM: 9.1 mg/dL (ref 8.9–10.3)
CHLORIDE: 103 mmol/L (ref 101–111)
CO2: 24 mmol/L (ref 22–32)
Creatinine, Ser: 0.86 mg/dL (ref 0.44–1.00)
Glucose, Bld: 179 mg/dL — ABNORMAL HIGH (ref 65–99)
Potassium: 3.7 mmol/L (ref 3.5–5.1)
SODIUM: 137 mmol/L (ref 135–145)
Total Bilirubin: 0.4 mg/dL (ref 0.3–1.2)
Total Protein: 7.3 g/dL (ref 6.5–8.1)

## 2016-10-09 LAB — GLUCOSE, CAPILLARY: Glucose-Capillary: 178 mg/dL — ABNORMAL HIGH (ref 65–99)

## 2016-10-09 NOTE — Progress Notes (Signed)
  Pre-Operative Nutrition Class:  Appt start time: 6015   End time:  1830.  Patient was seen on 10/07/16 for Pre-Operative B[facility]ric Surgery [facility]   Surgery date: 10/15/16 Surgery type: RYGB Start weight at [facility]: 275.7 Weight today: Pt denied  TANITA  BODY COMP RESULTS     BMI (kg/m^2)    Fat Mass (lbs)    Fat Free Mass (lbs)    Total Body Water (lbs)    Samples given per MNT protocol. Patient educated on appropriate usage: Bariatric Advantage Multivitamin Lot # [id number] Exp: 06/19  Bariatric Fusion Calcium Citrate Lot # [id number] Exp: 08/14/17  Premier Clear Protein Drink Lot # [id number] Exp: 03/11/17   The following the learning objectives were met by the patient during this course:  Identify Pre-Op Dietary Goals and will begin 2 weeks pre-operatively  Identify appropriate sources of fluids and proteins   State protein recommendations and appropriate sources pre and post-operatively  Identify Post-Operative Dietary Goals and will follow for 2 weeks post-operatively  Identify appropriate multivitamin and calcium sources  Describe the need for physical activity post-operatively and will follow MD recommendations  State when to call healthcare provider regarding medication questions or post-operative complications  Handouts given during class include:  Pre-Op Bariatric Surgery Diet Handout  Protein Shake Handout  Post-Op Bariatric Surgery Nutrition Handout  BELT Program Information Flyer  Support Group Information Flyer  [facility] Bariatric Supplements Price List  Follow-Up Plan: Patient will follow-up at [facility] 2 weeks post operatively for diet advancement per MD.

## 2016-10-09 NOTE — Progress Notes (Signed)
Per patient request, RN called patient PCP at [facility]  requesting copy of lab work done recently .  

## 2016-10-09 NOTE — Progress Notes (Signed)
HgA1c drawn on 09-16-16 on chart . Result is 8.9

## 2016-10-14 ENCOUNTER — Encounter (HOSPITAL_COMMUNITY): Payer: Self-pay | Admitting: Anesthesiology

## 2016-10-14 NOTE — Anesthesia Preprocedure Evaluation (Addendum)
Anesthesia Evaluation  Patient identified by MRN, date of birth, ID band Patient awake    Reviewed: Allergy & Precautions, NPO status , Patient's Chart, lab work & pertinent test results  Airway Mallampati: III  TM Distance: >3 FB Neck ROM: Full    Dental no notable dental hx. (+) Teeth Intact   Pulmonary sleep apnea and Continuous Positive Airway Pressure Ventilation ,    Pulmonary exam normal breath sounds clear to auscultation       Cardiovascular hypertension, Pt. on medications Normal cardiovascular exam Rhythm:Regular Rate:Normal     Neuro/Psych negative neurological ROS  negative psych ROS   GI/Hepatic Neg liver ROS, hiatal hernia, GERD  Medicated and Controlled,  Endo/Other  diabetes, Well Controlled, Type 2, Insulin DependentMorbid obesity  Renal/GU negative Renal ROS  negative genitourinary   Musculoskeletal negative musculoskeletal ROS (+)   Abdominal (+) + obese,   Peds  Hematology  (+) anemia ,   Anesthesia Other Findings   Reproductive/Obstetrics negative OB ROS                            Anesthesia Physical Anesthesia Plan  ASA: III  Anesthesia Plan: General   Post-op Pain Management:    Induction: Intravenous and Cricoid pressure planned  PONV Risk Score and Plan: 4 or greater and Ondansetron, Dexamethasone, Propofol, Midazolam, Scopolamine patch - Pre-op and Metaclopromide  Airway Management Planned: Oral ETT  Additional Equipment:   Intra-op Plan:   Post-operative Plan: Extubation in OR  Informed Consent: I have reviewed the patients History and Physical, chart, labs and discussed the procedure including the risks, benefits and alternatives for the proposed anesthesia with the patient or authorized representative who has indicated his/her understanding and acceptance.   Dental advisory given  Plan Discussed with: CRNA, Anesthesiologist and  Surgeon  Anesthesia Plan Comments:        Anesthesia Quick Evaluation

## 2016-10-15 ENCOUNTER — Inpatient Hospital Stay (HOSPITAL_COMMUNITY)
Admission: RE | Admit: 2016-10-15 | Discharge: 2016-10-17 | DRG: 621 | Disposition: A | Discharge status: Home / Self Care | Payer: BC Managed Care – PPO | Source: Ambulatory Visit | Attending: Surgery | Admitting: Surgery

## 2016-10-15 ENCOUNTER — Encounter (HOSPITAL_COMMUNITY): Payer: Self-pay | Admitting: *Deleted

## 2016-10-15 ENCOUNTER — Encounter (HOSPITAL_COMMUNITY)
Admission: RE | Disposition: A | Discharge status: Home / Self Care | Payer: Self-pay | Source: Ambulatory Visit | Attending: Surgery

## 2016-10-15 ENCOUNTER — Inpatient Hospital Stay (HOSPITAL_COMMUNITY): Payer: BC Managed Care – PPO | Admitting: Certified Registered Nurse Anesthetist

## 2016-10-15 DIAGNOSIS — K449 Diaphragmatic hernia without obstruction or gangrene: Secondary | ICD-10-CM | POA: Diagnosis present

## 2016-10-15 DIAGNOSIS — E7800 Pure hypercholesterolemia, unspecified: Secondary | ICD-10-CM | POA: Diagnosis present

## 2016-10-15 DIAGNOSIS — E785 Hyperlipidemia, unspecified: Secondary | ICD-10-CM | POA: Diagnosis present

## 2016-10-15 DIAGNOSIS — Z79899 Other long term (current) drug therapy: Secondary | ICD-10-CM

## 2016-10-15 DIAGNOSIS — G4733 Obstructive sleep apnea (adult) (pediatric): Secondary | ICD-10-CM | POA: Diagnosis present

## 2016-10-15 DIAGNOSIS — M1990 Unspecified osteoarthritis, unspecified site: Secondary | ICD-10-CM | POA: Diagnosis present

## 2016-10-15 DIAGNOSIS — Z6834 Body mass index (BMI) 34.0-34.9, adult: Secondary | ICD-10-CM

## 2016-10-15 DIAGNOSIS — I10 Essential (primary) hypertension: Secondary | ICD-10-CM | POA: Diagnosis present

## 2016-10-15 DIAGNOSIS — Z6841 Body Mass Index (BMI) 40.0 and over, adult: Secondary | ICD-10-CM | POA: Diagnosis not present

## 2016-10-15 DIAGNOSIS — E119 Type 2 diabetes mellitus without complications: Secondary | ICD-10-CM | POA: Diagnosis present

## 2016-10-15 DIAGNOSIS — Z9884 Bariatric surgery status: Secondary | ICD-10-CM

## 2016-10-15 DIAGNOSIS — E6609 Other obesity due to excess calories: Secondary | ICD-10-CM | POA: Diagnosis present

## 2016-10-15 HISTORY — PX: GASTRIC ROUX-EN-Y: SHX5262

## 2016-10-15 LAB — GLUCOSE, CAPILLARY
GLUCOSE-CAPILLARY: 94 mg/dL (ref 65–99)
GLUCOSE-CAPILLARY: 147 mg/dL — AB (ref 65–99)
Glucose-Capillary: 106 mg/dL — ABNORMAL HIGH (ref 65–99)
Glucose-Capillary: 121 mg/dL — ABNORMAL HIGH (ref 65–99)
Glucose-Capillary: 128 mg/dL — ABNORMAL HIGH (ref 65–99)
Glucose-Capillary: 183 mg/dL — ABNORMAL HIGH (ref 65–99)

## 2016-10-15 LAB — HEMOGLOBIN AND HEMATOCRIT, BLOOD
HEMATOCRIT: 33.5 % — AB (ref 36.0–46.0)
HEMOGLOBIN: 10.8 g/dL — AB (ref 12.0–15.0)

## 2016-10-15 LAB — PREGNANCY, URINE: PREG TEST UR: NEGATIVE

## 2016-10-15 SURGERY — LAPAROSCOPIC ROUX-EN-Y GASTRIC BYPASS WITH UPPER ENDOSCOPY
Anesthesia: General

## 2016-10-15 MED ORDER — PANTOPRAZOLE SODIUM 40 MG IV SOLR
40.0000 mg | Freq: Every day | INTRAVENOUS | Status: DC
  Administered 2016-10-15 – 2016-10-16 (×2): 40 mg via INTRAVENOUS
  Filled 2016-10-15 (×2): qty 40

## 2016-10-15 MED ORDER — ROCURONIUM BROMIDE 50 MG/5ML IV SOSY
PREFILLED_SYRINGE | INTRAVENOUS | Status: AC
  Filled 2016-10-15: qty 5

## 2016-10-15 MED ORDER — ACETAMINOPHEN 160 MG/5ML PO SOLN
325.0000 mg | ORAL | Status: DC | PRN
  Administered 2016-10-16: 325 mg via ORAL
  Filled 2016-10-15: qty 20.3

## 2016-10-15 MED ORDER — ORAL CARE MOUTH RINSE
15.0000 mL | Freq: Two times a day (BID) | OROMUCOSAL | Status: DC
  Administered 2016-10-16 (×2): 15 mL via OROMUCOSAL

## 2016-10-15 MED ORDER — LACTATED RINGERS IV SOLN
INTRAVENOUS | Status: DC | PRN
  Administered 2016-10-15 (×2): via INTRAVENOUS

## 2016-10-15 MED ORDER — SUCCINYLCHOLINE CHLORIDE 200 MG/10ML IV SOSY
PREFILLED_SYRINGE | INTRAVENOUS | Status: DC | PRN
  Administered 2016-10-15: 120 mg via INTRAVENOUS

## 2016-10-15 MED ORDER — PROPOFOL 10 MG/ML IV BOLUS
INTRAVENOUS | Status: DC | PRN
  Administered 2016-10-15: 20 mg via INTRAVENOUS
  Administered 2016-10-15: 200 mg via INTRAVENOUS

## 2016-10-15 MED ORDER — SUGAMMADEX SODIUM 500 MG/5ML IV SOLN
INTRAVENOUS | Status: DC | PRN
  Administered 2016-10-15: 250 mg via INTRAVENOUS

## 2016-10-15 MED ORDER — LACTATED RINGERS IR SOLN
Status: DC | PRN
  Administered 2016-10-15: 1

## 2016-10-15 MED ORDER — ONDANSETRON HCL 4 MG/2ML IJ SOLN
INTRAMUSCULAR | Status: AC
  Filled 2016-10-15: qty 2

## 2016-10-15 MED ORDER — PROPOFOL 10 MG/ML IV BOLUS
INTRAVENOUS | Status: AC
  Filled 2016-10-15: qty 40

## 2016-10-15 MED ORDER — APREPITANT 40 MG PO CAPS
40.0000 mg | ORAL_CAPSULE | ORAL | Status: DC
  Filled 2016-10-15: qty 1

## 2016-10-15 MED ORDER — CHLORHEXIDINE GLUCONATE 0.12 % MT SOLN
15.0000 mL | Freq: Two times a day (BID) | OROMUCOSAL | Status: DC
  Administered 2016-10-15 – 2016-10-17 (×4): 15 mL via OROMUCOSAL
  Filled 2016-10-15 (×4): qty 15

## 2016-10-15 MED ORDER — INSULIN ASPART 100 UNIT/ML ~~LOC~~ SOLN
0.0000 [IU] | SUBCUTANEOUS | Status: DC
Start: 2016-10-15 — End: 2016-10-17
  Administered 2016-10-15 (×2): 3 [IU] via SUBCUTANEOUS

## 2016-10-15 MED ORDER — FENTANYL CITRATE (PF) 100 MCG/2ML IJ SOLN
INTRAMUSCULAR | Status: AC
  Filled 2016-10-15: qty 2

## 2016-10-15 MED ORDER — MIDAZOLAM HCL 5 MG/5ML IJ SOLN
INTRAMUSCULAR | Status: DC | PRN
  Administered 2016-10-15: 1 mg via INTRAVENOUS

## 2016-10-15 MED ORDER — SCOPOLAMINE 1 MG/3DAYS TD PT72
1.0000 | MEDICATED_PATCH | TRANSDERMAL | Status: DC
  Administered 2016-10-15: 1.5 mg via TRANSDERMAL
  Filled 2016-10-15: qty 1

## 2016-10-15 MED ORDER — FENTANYL CITRATE (PF) 250 MCG/5ML IJ SOLN
INTRAMUSCULAR | Status: AC
  Filled 2016-10-15: qty 5

## 2016-10-15 MED ORDER — OXYCODONE HCL 5 MG/5ML PO SOLN
5.0000 mg | ORAL | Status: DC | PRN
  Administered 2016-10-16 – 2016-10-17 (×4): 5 mg via ORAL
  Filled 2016-10-15 (×4): qty 5

## 2016-10-15 MED ORDER — DEXMEDETOMIDINE HCL IN NACL 200 MCG/50ML IV SOLN
INTRAVENOUS | Status: DC | PRN
  Administered 2016-10-15: .1 ug/kg/h via INTRAVENOUS

## 2016-10-15 MED ORDER — BUPIVACAINE LIPOSOME 1.3 % IJ SUSP
20.0000 mL | Freq: Once | INTRAMUSCULAR | Status: AC
  Administered 2016-10-15: 20 mL
  Filled 2016-10-15: qty 20

## 2016-10-15 MED ORDER — LIDOCAINE 2% (20 MG/ML) 5 ML SYRINGE
INTRAMUSCULAR | Status: AC
  Filled 2016-10-15: qty 5

## 2016-10-15 MED ORDER — ONDANSETRON HCL 4 MG/2ML IJ SOLN
INTRAMUSCULAR | Status: DC | PRN
  Administered 2016-10-15: 4 mg via INTRAVENOUS

## 2016-10-15 MED ORDER — CEFOTETAN DISODIUM-DEXTROSE 2-2.08 GM-% IV SOLR
2.0000 g | INTRAVENOUS | Status: AC
  Administered 2016-10-15: 2 g via INTRAVENOUS
  Filled 2016-10-15: qty 50

## 2016-10-15 MED ORDER — DEXMEDETOMIDINE HCL IN NACL 200 MCG/50ML IV SOLN
INTRAVENOUS | Status: AC
  Filled 2016-10-15: qty 50

## 2016-10-15 MED ORDER — MIDAZOLAM HCL 2 MG/2ML IJ SOLN
INTRAMUSCULAR | Status: AC
  Filled 2016-10-15: qty 2

## 2016-10-15 MED ORDER — PREMIER PROTEIN SHAKE
2.0000 [oz_av] | ORAL | Status: DC
Start: 2016-10-16 — End: 2016-10-17
  Administered 2016-10-17 (×2): 2 [oz_av] via ORAL

## 2016-10-15 MED ORDER — EVICEL 5 ML EX KIT
PACK | Freq: Once | CUTANEOUS | Status: AC
  Administered 2016-10-15: 1
  Filled 2016-10-15: qty 1

## 2016-10-15 MED ORDER — 0.9 % SODIUM CHLORIDE (POUR BTL) OPTIME
TOPICAL | Status: DC | PRN
  Administered 2016-10-15: 2000 mL

## 2016-10-15 MED ORDER — ROCURONIUM BROMIDE 50 MG/5ML IV SOSY
PREFILLED_SYRINGE | INTRAVENOUS | Status: DC | PRN
  Administered 2016-10-15: 10 mg via INTRAVENOUS
  Administered 2016-10-15: 50 mg via INTRAVENOUS
  Administered 2016-10-15 (×5): 10 mg via INTRAVENOUS

## 2016-10-15 MED ORDER — SUGAMMADEX SODIUM 500 MG/5ML IV SOLN
INTRAVENOUS | Status: AC
  Filled 2016-10-15: qty 5

## 2016-10-15 MED ORDER — LIDOCAINE 2% (20 MG/ML) 5 ML SYRINGE
INTRAMUSCULAR | Status: DC | PRN
Start: 2016-10-15 — End: 2016-10-15
  Administered 2016-10-15: 100 mg via INTRAVENOUS

## 2016-10-15 MED ORDER — SODIUM CHLORIDE 0.9 % IV SOLN
INTRAVENOUS | Status: DC
  Administered 2016-10-15: 1000 mL via INTRAVENOUS
  Administered 2016-10-15: 15:00:00 via INTRAVENOUS

## 2016-10-15 MED ORDER — METOPROLOL TARTRATE 5 MG/5ML IV SOLN: 5.0000 mg | Freq: Four times a day (QID) | INTRAVENOUS | Status: DC | PRN

## 2016-10-15 MED ORDER — ESMOLOL HCL 100 MG/10ML IV SOLN
INTRAVENOUS | Status: DC | PRN
  Administered 2016-10-15: 20 mg via INTRAVENOUS
  Administered 2016-10-15 (×2): 5 mg via INTRAVENOUS
  Administered 2016-10-15: 50 mg via INTRAVENOUS

## 2016-10-15 MED ORDER — HYDRALAZINE HCL 20 MG/ML IJ SOLN: 10.0000 mg | INTRAMUSCULAR | Status: DC | PRN

## 2016-10-15 MED ORDER — HYDROMORPHONE HCL 1 MG/ML IJ SOLN
0.5000 mg | INTRAMUSCULAR | Status: DC | PRN
  Administered 2016-10-15 – 2016-10-16 (×3): 0.5 mg via INTRAVENOUS
  Filled 2016-10-15 (×3): qty 0.5

## 2016-10-15 MED ORDER — APREPITANT 40 MG PO CAPS
40.0000 mg | ORAL_CAPSULE | ORAL | Status: AC
  Administered 2016-10-15: 40 mg via ORAL

## 2016-10-15 MED ORDER — CHLORHEXIDINE GLUCONATE 4 % EX LIQD: 60.0000 mL | Freq: Once | CUTANEOUS | Status: DC

## 2016-10-15 MED ORDER — DEXAMETHASONE SODIUM PHOSPHATE 10 MG/ML IJ SOLN
INTRAMUSCULAR | Status: AC
  Filled 2016-10-15: qty 1

## 2016-10-15 MED ORDER — CELECOXIB 200 MG PO CAPS
ORAL_CAPSULE | ORAL | Status: AC
  Administered 2016-10-15: 200 mg
  Filled 2016-10-15: qty 1

## 2016-10-15 MED ORDER — HYDROMORPHONE HCL 2 MG/ML IJ SOLN
INTRAMUSCULAR | Status: AC
  Filled 2016-10-15: qty 1

## 2016-10-15 MED ORDER — HYDROMORPHONE HCL 2 MG/ML IJ SOLN
0.2500 mg | INTRAMUSCULAR | Status: DC | PRN
  Administered 2016-10-15 (×4): 0.5 mg via INTRAVENOUS

## 2016-10-15 MED ORDER — EPHEDRINE SULFATE 50 MG/ML IJ SOLN
INTRAMUSCULAR | Status: DC | PRN
  Administered 2016-10-15 (×3): 10 mg via INTRAVENOUS

## 2016-10-15 MED ORDER — ONDANSETRON HCL 4 MG/2ML IJ SOLN: 4.0000 mg | INTRAMUSCULAR | Status: DC | PRN

## 2016-10-15 MED ORDER — METOCLOPRAMIDE HCL 5 MG/ML IJ SOLN: 10.0000 mg | Freq: Once | INTRAMUSCULAR | Status: DC | PRN

## 2016-10-15 MED ORDER — ACETAMINOPHEN 500 MG PO TABS
ORAL_TABLET | ORAL | Status: AC
  Administered 2016-10-15: 1000 mg
  Filled 2016-10-15: qty 2

## 2016-10-15 MED ORDER — FENTANYL CITRATE (PF) 100 MCG/2ML IJ SOLN
INTRAMUSCULAR | Status: DC | PRN
  Administered 2016-10-15: 50 ug via INTRAVENOUS
  Administered 2016-10-15: 100 ug via INTRAVENOUS
  Administered 2016-10-15 (×4): 50 ug via INTRAVENOUS

## 2016-10-15 MED ORDER — KETAMINE HCL 10 MG/ML IJ SOLN
INTRAMUSCULAR | Status: AC
  Filled 2016-10-15: qty 1

## 2016-10-15 MED ORDER — DEXAMETHASONE SODIUM PHOSPHATE 10 MG/ML IJ SOLN
INTRAMUSCULAR | Status: DC | PRN
  Administered 2016-10-15: 5 mg via INTRAVENOUS

## 2016-10-15 MED ORDER — ENOXAPARIN SODIUM 40 MG/0.4ML ~~LOC~~ SOLN
40.0000 mg | SUBCUTANEOUS | Status: AC
  Administered 2016-10-15: 40 mg via SUBCUTANEOUS
  Filled 2016-10-15: qty 0.4

## 2016-10-15 MED ORDER — SUCCINYLCHOLINE CHLORIDE 200 MG/10ML IV SOSY
PREFILLED_SYRINGE | INTRAVENOUS | Status: AC
  Filled 2016-10-15: qty 10

## 2016-10-15 MED ORDER — GABAPENTIN 300 MG PO CAPS
ORAL_CAPSULE | ORAL | Status: AC
  Administered 2016-10-15: 300 mg
  Filled 2016-10-15: qty 1

## 2016-10-15 MED ORDER — ENOXAPARIN SODIUM 30 MG/0.3ML ~~LOC~~ SOLN
30.0000 mg | Freq: Two times a day (BID) | SUBCUTANEOUS | Status: DC
  Administered 2016-10-16 – 2016-10-17 (×3): 30 mg via SUBCUTANEOUS
  Filled 2016-10-15 (×3): qty 0.3

## 2016-10-15 MED ORDER — ACETAMINOPHEN 325 MG PO TABS
650.0000 mg | ORAL_TABLET | ORAL | Status: DC | PRN
  Filled 2016-10-15: qty 2

## 2016-10-15 MED ORDER — MEPERIDINE HCL 50 MG/ML IJ SOLN: 6.2500 mg | INTRAMUSCULAR | Status: DC | PRN

## 2016-10-15 MED ORDER — BUPIVACAINE-EPINEPHRINE 0.25% -1:200000 IJ SOLN
INTRAMUSCULAR | Status: DC | PRN
  Administered 2016-10-15: 30 mL

## 2016-10-15 MED ORDER — ESMOLOL HCL 100 MG/10ML IV SOLN
INTRAVENOUS | Status: AC
Start: 2016-10-15 — End: 2016-10-15
  Filled 2016-10-15: qty 10

## 2016-10-15 MED ORDER — BUPIVACAINE-EPINEPHRINE (PF) 0.25% -1:200000 IJ SOLN
INTRAMUSCULAR | Status: AC
  Filled 2016-10-15: qty 30

## 2016-10-15 SURGICAL SUPPLY — 80 items
ADH SKN CLS APL DERMABOND .7 (GAUZE/BANDAGES/DRESSINGS) ×1
APL SKNCLS STERI-STRIP NONHPOA (GAUZE/BANDAGES/DRESSINGS) ×1
APPLICATOR COTTON TIP 6IN STRL (MISCELLANEOUS) ×6 IMPLANT
APPLIER CLIP ROT 13.4 12 LRG (CLIP)
APR CLP LRG 13.4X12 ROT 20 MLT (CLIP)
BENZOIN TINCTURE PRP APPL 2/3 (GAUZE/BANDAGES/DRESSINGS) ×2 IMPLANT
BLADE SURG SZ11 CARB STEEL (BLADE) ×3 IMPLANT
CABLE HIGH FREQUENCY MONO STRZ (ELECTRODE) ×3 IMPLANT
CHLORAPREP W/TINT 26ML (MISCELLANEOUS) ×6 IMPLANT
CLIP APPLIE ROT 13.4 12 LRG (CLIP) IMPLANT
CLIP SUT LAPRA TY ABSORB (SUTURE) ×6 IMPLANT
COVER SURGICAL LIGHT HANDLE (MISCELLANEOUS) ×3 IMPLANT
DERMABOND ADVANCED (GAUZE/BANDAGES/DRESSINGS) ×2
DERMABOND ADVANCED .7 DNX12 (GAUZE/BANDAGES/DRESSINGS) ×1 IMPLANT
DEVICE SUT QUICK LOAD TK 5 (STAPLE) IMPLANT
DEVICE SUT TI-KNOT TK 5X26 (MISCELLANEOUS) IMPLANT
DEVICE SUTURE ENDOST 10MM (ENDOMECHANICALS) ×5 IMPLANT
DEVICE TI KNOT TK5 (MISCELLANEOUS)
DRAIN PENROSE 18X1/4 LTX STRL (WOUND CARE) ×3 IMPLANT
ELECT REM PT RETURN 15FT ADLT (MISCELLANEOUS) ×3 IMPLANT
GAUZE SPONGE 4X4 12PLY STRL (GAUZE/BANDAGES/DRESSINGS) IMPLANT
GAUZE SPONGE 4X4 16PLY XRAY LF (GAUZE/BANDAGES/DRESSINGS) ×3 IMPLANT
GLOVE BIO SURGEON STRL SZ 6 (GLOVE) ×3 IMPLANT
GLOVE INDICATOR 6.5 STRL GRN (GLOVE) ×3 IMPLANT
GOWN STRL REUS W/TWL LRG LVL3 (GOWN DISPOSABLE) ×3 IMPLANT
GOWN STRL REUS W/TWL XL LVL3 (GOWN DISPOSABLE) ×9 IMPLANT
HOVERMATT SINGLE USE (MISCELLANEOUS) ×3 IMPLANT
IRRIG SUCT STRYKERFLOW 2 WTIP (MISCELLANEOUS) ×3
IRRIGATION SUCT STRKRFLW 2 WTP (MISCELLANEOUS) ×1 IMPLANT
KIT BASIN OR (CUSTOM PROCEDURE TRAY) ×3 IMPLANT
KIT GASTRIC LAVAGE 34FR ADT (SET/KITS/TRAYS/PACK) ×3 IMPLANT
LUBRICANT JELLY K Y 4OZ (MISCELLANEOUS) ×3 IMPLANT
MARKER SKIN DUAL TIP RULER LAB (MISCELLANEOUS) ×3 IMPLANT
NDL SPNL 22GX3.5 QUINCKE BK (NEEDLE) ×1 IMPLANT
NEEDLE SPNL 22GX3.5 QUINCKE BK (NEEDLE) ×3 IMPLANT
PACK CARDIOVASCULAR III (CUSTOM PROCEDURE TRAY) ×3 IMPLANT
QUICK LOAD TK 5 (STAPLE)
RELOAD ENDO STITCH 2.0 (ENDOMECHANICALS) ×36
RELOAD STAPLE 60 2.6 WHT THN (STAPLE) IMPLANT
RELOAD STAPLE 60 3.6 BLU REG (STAPLE) IMPLANT
RELOAD STAPLE 60 3.8 GOLD REG (STAPLE) IMPLANT
RELOAD STAPLER BLUE 60MM (STAPLE) ×4 IMPLANT
RELOAD STAPLER GOLD 60MM (STAPLE) ×1 IMPLANT
RELOAD STAPLER WHITE 60MM (STAPLE) IMPLANT
RELOAD SUT SNGL STCH ABSRB 2-0 (ENDOMECHANICALS) ×4 IMPLANT
RELOAD SUT SNGL STCH BLK 2-0 (ENDOMECHANICALS) ×4 IMPLANT
SCISSORS LAP 5X45 EPIX DISP (ENDOMECHANICALS) ×3 IMPLANT
SHEARS HARMONIC ACE PLUS 45CM (MISCELLANEOUS) ×3 IMPLANT
SLEEVE ADV FIXATION 12X100MM (TROCAR) ×3 IMPLANT
SLEEVE ADV FIXATION 5X100MM (TROCAR) ×6 IMPLANT
SLEEVE XCEL OPT CAN 5 100 (ENDOMECHANICALS) ×2 IMPLANT
SOLUTION ANTI FOG 6CC (MISCELLANEOUS) ×3 IMPLANT
STAPLER ECHELON BIOABSB 60 FLE (MISCELLANEOUS) IMPLANT
STAPLER ECHELON LONG 60 440 (INSTRUMENTS) ×3 IMPLANT
STAPLER RELOAD BLUE 60MM (STAPLE) ×12
STAPLER RELOAD GOLD 60MM (STAPLE) ×3
STAPLER RELOAD WHITE 60MM (STAPLE)
SUT MNCRL AB 4-0 PS2 18 (SUTURE) ×3 IMPLANT
SUT RELOAD ENDO STITCH 2 48X1 (ENDOMECHANICALS) ×8
SUT RELOAD ENDO STITCH 2.0 (ENDOMECHANICALS) ×4
SUT SURGIDAC NAB ES-9 0 48 120 (SUTURE) IMPLANT
SUT VIC AB 2-0 SH 27 (SUTURE) ×3
SUT VIC AB 2-0 SH 27X BRD (SUTURE) ×1 IMPLANT
SUTURE RELOAD END STTCH 2 48X1 (ENDOMECHANICALS) ×8 IMPLANT
SUTURE RELOAD ENDO STITCH 2.0 (ENDOMECHANICALS) ×4 IMPLANT
SYR 10ML ECCENTRIC (SYRINGE) ×3 IMPLANT
SYR 20CC LL (SYRINGE) ×6 IMPLANT
TAPE STRIPS DRAPE STRL (GAUZE/BANDAGES/DRESSINGS) ×2 IMPLANT
TIP RIGID 35CM EVICEL (HEMOSTASIS) ×2 IMPLANT
TOWEL OR 17X26 10 PK STRL BLUE (TOWEL DISPOSABLE) ×3 IMPLANT
TOWEL OR NON WOVEN STRL DISP B (DISPOSABLE) ×3 IMPLANT
TRAY FOLEY W/METER SILVER 16FR (SET/KITS/TRAYS/PACK) ×3 IMPLANT
TROCAR ADV FIXATION 12X100MM (TROCAR) ×3 IMPLANT
TROCAR ADV FIXATION 5X100MM (TROCAR) ×3 IMPLANT
TROCAR BLADELESS OPT 5 100 (ENDOMECHANICALS) ×2 IMPLANT
TROCAR XCEL 12X100 BLDLESS (ENDOMECHANICALS) ×3 IMPLANT
TUBING CONNECTING 10 (TUBING) ×2 IMPLANT
TUBING CONNECTING 10' (TUBING) ×2
TUBING ENDO SMARTCAP PENTAX (MISCELLANEOUS) ×3 IMPLANT
TUBING INSUF HEATED (TUBING) ×3 IMPLANT

## 2016-10-15 NOTE — Transfer of Care (Signed)
Immediate Anesthesia Transfer of Care Note  Patient: Sue Bell  Procedure(s) Performed: Procedure(s): LAPAROSCOPIC ROUX-EN-Y GASTRIC BYPASS WITH UPPER ENDOSCOPY (N/A)  Patient Location: PACU  Anesthesia Type:General  Level of Consciousness:  sedated, patient cooperative and responds to stimulation  Airway & Oxygen Therapy:Patient Spontanous Breathing and Patient connected to face mask oxgen  Post-op Assessment:  Report given to PACU RN and Post -op Vital signs reviewed and stable  Post vital signs:  Reviewed and stable  Last Vitals:  Vitals:   10/15/16 0543 10/15/16 1104  BP: (!) 144/86 136/76  Pulse: (!) 111 (!) 102  Resp: 20 (!) 22  Temp: 36.9 C 36.8 C    Complications: No apparent anesthesia complications

## 2016-10-15 NOTE — Anesthesia Procedure Notes (Signed)
Procedure Name: Intubation Date/Time: 10/15/2016 7:29 AM Performed by: West Pugh Pre-anesthesia Checklist: Patient identified, Emergency Drugs available, Suction available, Patient being monitored and Timeout performed Patient Re-evaluated:Patient Re-evaluated prior to inductionOxygen Delivery Method: Circle system utilized Preoxygenation: Pre-oxygenation with 100% oxygen Intubation Type: IV induction Ventilation: Mask ventilation without difficulty and Oral airway inserted - appropriate to patient size Laryngoscope Size: Mac and 4 Grade View: Grade I Tube type: Oral Tube size: 7.0 mm Number of attempts: 1 Airway Equipment and Method: Stylet Placement Confirmation: ETT inserted through vocal cords under direct vision,  positive ETCO2,  CO2 detector and breath sounds checked- equal and bilateral Secured at: 21 cm Tube secured with: Tape Dental Injury: Teeth and Oropharynx as per pre-operative assessment  Comments: Dr Royce Macadamia assisted intubation with paramedic student. Mask ventilation performed throughout procedure with oral airway in place.

## 2016-10-15 NOTE — Discharge Instructions (Signed)
° ° ° °GASTRIC BYPASS/SLEEVE ° Home Care Instructions ° ° These instructions are to help you care for yourself when you go home. ° °Call: If you have any problems. °• Call [phone] and ask for the surgeon on call °• If you need immediate assistance come to the ER at [facility]. Tell the ER staff you are a new post-op gastric bypass or gastric sleeve patient  °Signs and symptoms to report: • Severe  vomiting or nausea °o If you cannot handle clear liquids for longer than 1 day, call your surgeon °• Abdominal pain which does not get better after taking your pain medication °• Fever greater than 100.4°  F and chills °• Heart rate over 100 beats a minute °• Trouble breathing °• Chest pain °• Redness,  swelling, drainage, or foul odor at incision (surgical) sites °• If your incisions open or pull apart °• Swelling or pain in calf (lower leg) °• Diarrhea (Loose bowel movements that happen often), frequent watery, uncontrolled bowel movements °• Constipation, (no bowel movements for 3 days) if this happens: °o Take Milk of Magnesia, 2 tablespoons by mouth, 3 times a day for 2 days if needed °o Stop taking Milk of Magnesia once you have had a bowel movement °o Call your doctor if constipation continues °Or °o Take Miralax  (instead of Milk of Magnesia) following the label instructions °o Stop taking Miralax once you have had a bowel movement °o Call your doctor if constipation continues °• Anything you think is “abnormal for you” °  °Normal side effects after surgery: • Unable to sleep at night or unable to concentrate °• Irritability °• Being tearful (crying) or depressed ° °These are common complaints, possibly related to your anesthesia, stress of surgery, and change in lifestyle, that usually go away a few weeks after surgery. If these feelings continue, call your medical doctor.  °Wound Care: You may have surgical glue, steri-strips, or staples over your incisions after surgery °• Surgical glue: Looks like clear  film over your incisions and will wear off a little at a time °• Steri-strips: Adhesive strips of tape over your incisions. You may notice a yellowish color on skin under the steri-strips. This is used to make the steri-strips stick better. Do not pull the steri-strips off - let them fall off °• Staples: Staples may be removed before you leave the hospital °o If you go home with staples, call [facility] for an appointment with your surgeon’s nurse to have staples removed 10 days after surgery, [phone] °• Showering: You may shower two (2) days after your surgery unless your surgeon tells you differently °o Wash gently around incisions with warm soapy water, rinse well, and gently pat dry °o If you have a drain (tube from your incision), you may need someone to hold this while you shower °o No tub baths until staples are removed and incisions are healed °  °Medications: • Medications should be liquid or crushed if larger than the size of a dime °• Extended release pills (medication that releases a little bit at a time through the  day) should not be crushed °• Depending on the size and number of medications you take, you may need to space (take a few throughout the day)/change the time you take your medications so that you do not over-fill your pouch (smaller stomach) °• Make sure you follow-up with you primary care physician to make medication changes needed during rapid weight loss and life -style changes °•   If you have diabetes, follow up with your doctor that orders your diabetes medication(s) within one week after surgery and check your blood sugar regularly ° °• Do not drive while taking narcotics (pain medications) ° °• Do not take acetaminophen (Tylenol) and Roxicet or Lortab Elixir at the same time since these pain medications contain acetaminophen °  °Diet:  °First 2 Weeks You will see the nutritionist about two (2) weeks after your surgery. The nutritionist will increase the types of  foods you can eat if you are handling liquids well: °• If you have severe vomiting or nausea and cannot handle clear liquids lasting longer than 1 day call your surgeon °Protein Shake °• Drink at least 2 ounces of shake 5-6 times per day °• Each serving of protein shakes (usually 8-12 ounces) should have a minimum of: °o 15 grams of protein °o And no more than 5 grams of carbohydrate °• Goal for protein each day: °o Men = 80 grams per day °o Women = 60 grams per day °  ° • Protein powder may be added to fluids such as non-fat milk or Lactaid milk or Soy milk (limit to 35 grams added protein powder per serving) ° °Hydration °• Slowly increase the amount of water and other clear liquids as tolerated (See Acceptable Fluids) °• Slowly increase the amount of protein shake as tolerated °• Sip fluids slowly and throughout the day °• May use sugar substitutes in small amounts (no more than 6-8 packets per day; i.e. Splenda) ° °Fluid Goal °• The first goal is to drink at least 8 ounces of protein shake/drink per day (or as directed by the nutritionist); some examples of protein shakes are Syntrax Nectar, Adkins Advantage, EAS Edge HP, and Unjury. - See handout from pre-op Bariatric Education Class: °o Slowly increase the amount of protein shake you drink as tolerated °o You may find it easier to slowly sip shakes throughout the day °o It is important to get your proteins in first °• Your fluid goal is to drink 64-100 ounces of fluid daily °o It may take a few weeks to build up to this  °• 32 oz. (or more) should be clear liquids °And °• 32 oz. (or more) should be full liquids (see below for examples) °• Liquids should not contain sugar, caffeine, or carbonation ° °Clear Liquids: °• Water of Sugar-free flavored water (i.e. Fruit H²O, Propel) °• Decaffeinated coffee or tea (sugar-free) °• Crystal lite, Wyler’s Lite, Minute Maid Lite °• Sugar-free Jell-O °• Bouillon or broth °• Sugar-free Popsicle:    - Less than 20 calories  each; Limit 1 per day ° °Full Liquids: °                  Protein Shakes/Drinks + 2 choices per day of other full liquids °• Full liquids must be: °o No More Than 12 grams of Carbs per serving °o No More Than 3 grams of Fat per serving °• Strained low-fat cream soup °• Non-Fat milk °• Fat-free Lactaid Milk °• Sugar-free yogurt (Dannon Lite & Fit, Greek yogurt) ° °  °Vitamins and Minerals • Start 1 day after surgery unless otherwise directed by your surgeon °• 2 Chewable Bariatric Multivitamin / Multimineral Supplement with iron  °• Chewable Calcium Citrate with Vitamin D-3 °(Example: 3 Chewable Calcium  Plus 600 with Vitamin D-3) °o Take 500 mg three (3) times a day for a total of 1500 mg each day °o Do not take all 3 doses of   calcium at one time as it may cause constipation, and you can only absorb 500 mg at a time °o Do not mix multivitamins containing iron with calcium supplements;  take 2 hours apart °• Menstruating women and those at risk for anemia ( a blood disease that causes weakness) may need extra iron °o Talk to your doctor to see if you need more iron °• If you need extra iron: Total daily Iron recommendation (including Vitamins) is 50 to 100 mg Iron/day °• Do not stop taking or change any vitamins or minerals until you talk to your nutritionist or surgeon °• Your nutritionist and/or surgeon must approve all vitamin and mineral supplements °  °Activity and Exercise: It is important to continue walking at home. Limit your physical activity as instructed by your doctor. During this time, use these guidelines: °• Do not lift anything greater than ten  (10) pounds for at least two (2) weeks °• Do not go back to work or drive until your surgeon says you can °• You may have sex when you feel comfortable °o It is VERY important for female patients to use a reliable birth control method; fertility often increase after surgery °o Do not get pregnant for at least 18 months °• Start exercising as soon as your  doctor tells you that you can °o Make sure your doctor approves any physical activity °• Start with a simple walking program °• Walk 5-15 minutes each day, 7 days per week °• Slowly increase until you are walking 30-45 minutes per day °• Consider joining our BELT program. [phone] or email [email or web address] °  °Special Instructions Things to remember: °• Use your CPAP when sleeping if this applies to you °  °  You will likely have your first fill (fluid added to your band) 6 - 8 weeks after surgery °• [facility] has a free Bariatric Surgery Support Group that meets monthly, the 3rd Thursday, 6pm. [facility] Classrooms. You can see classes online at [email or web address] °• It is very important to keep all follow up appointments with your surgeon, nutritionist, primary care physician, and behavioral health practitioner °o After the first year, please follow up with your bariatric surgeon and nutritionist at least once a year in order to maintain best weight loss results °      °             [facility]:  [phone] ° °             [facility]: [phone] ° °             Bariatric Nurse Coordinator: [phone]  °Gastric Bypass/Sleeve Home Care Instructions  Rev. 05/2012    ° °                                                    Reviewed and Endorsed °                                                   by [facility] Patient Education Committee, Jan, 2014 ° ° ° ° ° ° ° ° ° °

## 2016-10-15 NOTE — Anesthesia Postprocedure Evaluation (Signed)
Anesthesia Post Note  Patient: Regina Ecknita L Hargett  Procedure(s) Performed: Procedure(s) (LRB): LAPAROSCOPIC ROUX-EN-Y GASTRIC BYPASS WITH UPPER ENDOSCOPY (N/A)     Patient location during evaluation: PACU Anesthesia Type: General Level of consciousness: awake and alert and oriented Pain management: pain level controlled Vital Signs Assessment: post-procedure vital signs reviewed and stable Respiratory status: spontaneous breathing, nonlabored ventilation, respiratory function stable and patient connected to nasal cannula oxygen Cardiovascular status: blood pressure returned to baseline and stable Postop Assessment: no signs of nausea or vomiting Anesthetic complications: no    Last Vitals:  Vitals:   10/15/16 1135 10/15/16 1145  BP:  119/72  Pulse: 95 94  Resp: 14 10  Temp:      Last Pain:  Vitals:   10/15/16 1145  TempSrc:   PainSc: 4                  Mashanda Ishibashi A.

## 2016-10-15 NOTE — H&P (View-Only) (Signed)
Sue Bell 10/03/2016 9:44 AM Location: [facility] Patient #: [id number] DOB: December 31, 1970 Single / Language: Sue Bell / Race: Black or African American Female  History of Present Illness (Davis Vannatter A. Kae Heller MD; 10/03/2016 9:58 AM) Patient words: She has completed her preoperative workup. Her chest x-ray was negative. Her upper GI did demonstrate a small sliding type hiatal hernia but no reflux. Her sleep study did demonstrate moderate obstructive sleep apnea and CPAP titration was recommended; she met with Dr. Halford Chessman in pulmonology on May 2 in their plan was to arrange her CPAP set up. She has met with nutritionist and psychologist and has been deemed an appropriate candidate from their standpoint. Her preoperative labs including vitamin levels were all unremarkable with the exception of her hemoglobin A1c which was 7.6. She does report that she had this rechecked earlier this month and it had gone up again to 8.9. At our initial visit she had been leaning more towards the sleeve, however now that her hemoglobin A1c has trended up she is interested in whether she is still a candidate for bypass. She has her preoperative class the nutrition team this Monday. From initial Visit 05/08/16: She was here in 2012 and initiated workup for Band placement but this fell by the wayside for various reasons. She has now had 2 family members undergo bariatric surgery, one sleeve and one bypass. They have both done well. She is unsure which procedure she prefers but is leaning toward sleeve. She has been considering this for about a year and came to our info session in december. The patient is a 46 year old female who presents for a bariatric surgery evaluation. Associated symptoms include joint pains. Initial onset of obesity was during adolescence. Initial presentation included inability to lose weight, hypertension and diabetes. Disease complications include diabetes, hypertension, sleep apnea and  osteoarthritis. Current diet includes well balanced meals. walking. The patient is currently able to do activities of daily living without limitations and able to work without limitations. Past treatment has included diabetic diet, weight loss group and low carbohydrate diet. Surgical history: C-section - elective and hysterectomy. Gastrointestinal History: Patient denies heartburn or dysphagia. MBSQIP recognized comorbidities: Patient has hypertension, diabetes mellitus and sleep apnea. She works as an Glass blower/designer at [employer]. Nonsmoker.  The patient is a 46 year old female.   Past Surgical History [name], [place]; 10/03/2016 9:44 AM) Cesarean Section - 1 Foot Surgery Left. Hysterectomy (not due to cancer) - Partial Mammoplasty; Reduction Bilateral. Oral Surgery  Diagnostic Studies History [name], [place]; 10/03/2016 9:44 AM) Colonoscopy never Mammogram within last year Pap Smear 1-5 years ago  Allergies [name], [place]; 10/03/2016 9:47 AM) Sulfa Antibiotics Hives Allergies Reconciled  Medication History [name], RMA; 10/03/2016 9:49 AM) Levemir FlexTouch (100UNIT/ML Soln Pen-inj, Subcutaneous) Active. Pravastatin Sodium (40MG Tablet, Oral) Active. Valsartan (160MG Tablet, Oral) Active. Trulicity (0.4VW/0.9WJ Soln Pen-inj, Subcutaneous) Active. ZyrTEC Allergy (10MG Tablet, Oral) Active. Medications Reconciled  Social History [name], [place]; 10/03/2016 9:44 AM) Alcohol use Occasional alcohol use. Caffeine use Carbonated beverages, Tea. No drug use Tobacco use Never smoker.  Family History [name], [place]; 10/03/2016 9:44 AM) Alcohol Abuse Father. Arthritis Sister. Breast Cancer Sister. Diabetes Mellitus Father. Heart Disease Mother. Heart disease in female family member before age 3 Hypertension Father. Prostate Cancer Father.  Pregnancy / Birth History [name], [place]; 10/03/2016 9:44 AM) Age at menarche 14 years, 11  years. Contraceptive History Depo-provera, Oral contraceptives. Gravida 4 3 Maternal age 65-20 Para 48  Other Problems [name], [place]; 10/03/2016 9:44 AM) Diabetes Mellitus High blood pressure Hypercholesterolemia     Review of Systems [facility] RMA; 10/03/2016 9:44 AM) General Not Present- Appetite Loss, Chills, Fatigue, Fever, Night Sweats, Weight Gain and Weight Loss. Skin Not Present- Change in Wart/Mole, Dryness, Hives, Jaundice, New Lesions, Non-Healing Wounds, Rash and Ulcer. HEENT Present- Seasonal Allergies and Wears glasses/contact lenses. Not Present- Earache, Hearing Loss, Hoarseness, Nose Bleed, Oral Ulcers, Ringing in the Ears, Sinus Pain, Sore Throat, Visual Disturbances and Yellow Eyes. Respiratory Not Present- Bloody sputum, Chronic Cough, Difficulty Breathing, Snoring and Wheezing. Breast Not Present- Breast Mass, Breast Pain, Nipple Discharge and Skin Changes. Cardiovascular Not Present- Chest Pain, Difficulty Breathing Lying Down, Leg Cramps, Palpitations, Rapid Heart Rate, Shortness of Breath and Swelling of Extremities. Gastrointestinal Not Present- Abdominal Pain, Bloating, Bloody Stool, Change in Bowel Habits, Chronic diarrhea, Constipation, Difficulty Swallowing, Excessive gas, Gets full quickly at meals, Hemorrhoids, Indigestion, Nausea, Rectal Pain and Vomiting. Female Genitourinary Not Present- Frequency, Nocturia, Painful Urination, Pelvic Pain and Urgency. Musculoskeletal Not Present- Back Pain, Joint Pain, Joint Stiffness, Muscle Pain, Muscle Weakness and Swelling of Extremities. Neurological Not Present- Decreased Memory, Fainting, Headaches, Numbness, Seizures, Tingling, Tremor, Trouble walking and Weakness. Psychiatric Not Present- Anxiety, Bipolar, Change in Sleep Pattern, Depression, Fearful and Frequent crying. Endocrine Not Present- Cold Intolerance, Excessive Hunger, Hair Changes, Heat Intolerance, Hot flashes and New Diabetes. Hematology  Not Present- Blood Thinners, Easy Bruising, Excessive bleeding, Gland problems, HIV and Persistent Infections.  Vitals [employer] [place] RMA; 10/03/2016 9:45 AM) 10/03/2016 9:45 AM Weight: 277.8 lb Height: 63in Body Surface Area: 2.22 m Body Mass Index: 49.21 kg/m  Temp.: 98.55F  Pulse: 119 (Regular)  P.OX: 95% (Room air) BP: 128/88 (Sitting, Left Arm, Standard)      Physical Exam (Laela Deviney A. Kae Heller MD; 10/03/2016 10:00 AM)  General Note: She is alert and well-appearing  Integumentary Note: No lesions or rashes on limited skin exam  Head and Neck Note: No mass or thyromegaly  Eye Note: Anicteric, extraocular motions intact  ENMT Note: Moist mucous membranes, good dentition  Chest and Lung Exam Note: Unlabored respirations, symmetrical air entry  Cardiovascular Note: Regular rate and rhythm, no pedal edema  Abdomen Note: Obese, nontender. No palpable abnormality  Neurologic Note: Grossly intact, normal gait  Neuropsychiatric Note: Normal mood and affect, appropriate insight  Musculoskeletal Note: Strength symmetrical throughout, no deformity    Assessment & Plan (Conda Wannamaker A. Kae Heller MD; 10/03/2016 10:03 AM)  MORBID OBESITY (E66.01) Story: She has progressed through the pathway with no obstacles identified. With her increased HgA1c she has been considering bypass which I agree would be more likely to benefit her form the diabetes standpoint, as I advised her at our initial appointment. We will request insurance authorization for bypass. If approved plan to proceed as scheduled on July 3. From January: wt 275, BMI 47. She is leaning toward sleeve. We discussed the merits of both sleeve and bypass; given her diabetes which of late has been less manageable she may benefit more from bypass, but I do think that the weight loss from sleeve will also improve this parameter. We will re-initiate the pathway at this point. she will continue do do some  researching and we'll discuss at her next appt whether she'll undergo sleeve or bypass.

## 2016-10-15 NOTE — Progress Notes (Signed)
RT will return around 2300 to assist with cpap per pt request.

## 2016-10-15 NOTE — Interval H&P Note (Signed)
History and Physical Interval Note:  10/15/2016 6:46 AM  Sue EckAnita L Zartman  has presented today for surgery, with the diagnosis of Morbid Obesity, DM II, HTN, OSA  The various methods of treatment have been discussed with the patient and family. After consideration of risks, benefits and other options for treatment, the patient has consented to  Procedure(s): LAPAROSCOPIC ROUX-EN-Y GASTRIC BYPASS WITH UPPER ENDOSCOPY, POSSIBLE HIATAL HERNIA REPAIR (N/A) as a surgical intervention .  The patient's history has been reviewed, patient examined, no change in status, stable for surgery.  I have reviewed the patient's chart and labs.  Questions were answered to the patient's satisfaction.     Cherise Fedder Lollie SailsA Adonus Uselman

## 2016-10-15 NOTE — Op Note (Signed)
Sue Bell [id number] May 20, 1970. 10/15/2016  Preoperative diagnosis:  1. Morbid obesity Body mass index is 48.03 kg/m.   2. Obstructive sleep apnea 3. Diabetes Mellitus 4. Hypertension 5. Hyperlipidemia   Postoperative  diagnosis:  1. same  Surgical procedure: Laparoscopic Roux-en-Y gastric bypass (ante-colic, ante-gastric); upper endoscopy  Surgeon: Berna Bue, M.D.   Asst.: Luretha Murphy, MD  Anesthesia: General plus bilateral laparoscopic assisted TAPS blocks with exparel  Complications: None   EBL: Minimal   Drains: None   Disposition: PACU in good condition   Indications for procedure: 45yo woman with morbid obesity who has been unsuccessful at sustained weight loss. The patient's comorbidities are listed above. We discussed the risk and benefits of surgery including but not limited to anesthesia risk, bleeding, infection, blood clot formation, anastomotic leak, anastomotic stricture, ulcer formation, death, respiratory complications, intestinal blockage, internal hernia, gallstone formation, vitamin and nutritional deficiencies, injury to surrounding structures, failure to lose weight and mood changes.   Description of procedure: Patient is brought to the operating room and general anesthesia induced. The patient had received preoperative broad-spectrum IV antibiotics and subcutaneous lovenox. The abdomen was widely sterilely prepped with Chloraprep and draped. Patient timeout was performed and correct patient and procedure confirmed. Access was obtained with a 12 mm Optiview trocar in the left upper quadrant and pneumoperitoneum established without difficulty. Under direct vision a 12 mm trocar was placed laterally in the right upper quadrant, and 5mm trocars placed in the right upper quadrant midclavicular line, to the left and above the umbilicus for the camera port. A 5 mm trocar was placed laterally in the left upper quadrant. Under laparoscopic visualization  bilateral TAPS blocks were performed with exparel. The omentum was brought into the upper abdomen and the transverse mesocolon elevated and the ligament of Treitz clearly identified. A 40 cm biliopancreatic limb was then carefully measured from the ligament of Treitz. The small intestine was divided at this point with a single firing of the white load linear stapler. A Penrose drain was sutured to the end of the Roux-en-Y limb for later identification. A 100 cm Roux-en-Y limb was then carefully measured. At this point a side-to-side anastomosis was created between the Roux limb and the end of the biliopancreatic limb. This was accomplished with a single firing of the 60 mm white load linear stapler. The common enterotomy was closed with a running 2-0 Vicryl begun at either end of the enterotomy and tied centrally. Tisseel tissue sealant was placed over the anastomosis. The mesenteric defect was then closed with running 2-0 silk. The omentum was then divided with the harmonic scalpel up towards the transverse colon to allow mobility of the Roux limb toward the gastric pouch. The patient was then placed in steep reversed Trendelenburg. Through a 5 mm subxiphoid site the [facility] retractor was placed and the left lobe of the liver elevated with excellent exposure of the upper stomach and hiatus. Calibration tubing was passed down by the anesthesiologist and the hiatus tested. A 10mL balloon was not able to pass the hiatus thus no hiatal hernia was repaired. The angle of Hiss was then mobilized with the harmonic scalpel. A 5cm gastric pouch was then carefully measured along the lesser curve of the stomach. Dissection was carried along the lesser curve at this point with the Harmonic scalpel working carefully back toward the lesser sac at right angles to the lesser curve. The free lesser sac was then entered. After being sure all tubes were removed from the  stomach an initial firing of the gold load 60 mm linear stapler  was fired at right angles across the lesser curve for about 4 cm. The gastric pouch was further mobilized posteriorly and then the pouch was completed with further firings of the 60 mm blue load linear stapler and up through the previously dissected angle of His. It was ensured that the pouch was completely mobilized away from the gastric remnant. This created a nice tubular 4-5 cm gastric pouch. The Roux limb was then brought up in an antecolic fashion with the candycane facing to the patient's left without undue tension. The gastrojejunostomy was created with an initial posterior row of 2-0 Vicryl between the Roux limb and the staple line of the gastric pouch. Enterotomies were then made in the gastric pouch and the Roux limb with the harmonic scalpel and at approximately 3 cm anastomosis was created with a single firing of the blue load linear stapler. The staple line was inspected and was intact without bleeding. The common enterotomy was then closed with running 2-0 Vicryl begun at either end and tied centrally. The Ewall tube was then easily passed through the anastomosis and an outer anterior layer of running 2-0 Vicryl was placed. The Ewald tube was removed. With the outlet of the gastrojejunostomy clamped and under saline irrigation the assistant performed upper endoscopy and with the gastric pouch tensely distended with air-there was no evidence of leak on this test. The pouch was desufflated. The Vonita Mosseterson defect was closed with running 2-0 silk. The abdomen was inspected for any evidence of bleeding or bowel injury and everything looked fine. The Nathanson retractor was removed under direct vision after coating the anastomosis with Tisseel tissue sealant. All CO2 was evacuated and trochars removed. Skin incisions were closed with 4-0 monocryl in a subcuticular fashion followed by benzoin, steri strips and bandaids. Sponge needle and instrument counts were correct. The patient was taken to the PACU in good  condition.    Berna Buehelsea A Lydia Meng MD General, Bariatric, & Minimally Invasive Surgery [facility], [place]

## 2016-10-15 NOTE — Op Note (Signed)
Sue Bell [id number] [birth year] 10/15/2016  Preoperative diagnosis: gastric bypass in progress  Postoperative diagnosis: Same   Procedure: Upper endoscopy   Surgeon: Susy FrizzleMatt B. Daphine DeutscherMartin  M.D., FACS   Anesthesia: Gen.   Indications for procedure: This patient was undergoing a gastric bypass by Dr. Fredricka Bonineonnor.    Description of procedure: The endoscopy was placed in the mouth and into the oropharynx and under endoscopic vision it was advanced to the esophagogastric junction.  The pouch was insufflated and it was small.  The anastomosis and small bowel were visulalized.  .   No bleeding or leaks were detected.  The scope was withdrawn without difficulty.     Matt B. Daphine DeutscherMartin, MD, FACS General, Bariatric, & Minimally Invasive Surgery [facility], [place]

## 2016-10-16 LAB — COMPREHENSIVE METABOLIC PANEL
ALT: 77 U/L — ABNORMAL HIGH (ref 14–54)
AST: 96 U/L — AB (ref 15–41)
Albumin: 3.4 g/dL — ABNORMAL LOW (ref 3.5–5.0)
Alkaline Phosphatase: 58 U/L (ref 38–126)
Anion gap: 7 (ref 5–15)
BILIRUBIN TOTAL: 0.6 mg/dL (ref 0.3–1.2)
BUN: 8 mg/dL (ref 6–20)
CO2: 26 mmol/L (ref 22–32)
Calcium: 8.3 mg/dL — ABNORMAL LOW (ref 8.9–10.3)
Chloride: 105 mmol/L (ref 101–111)
Creatinine, Ser: 0.74 mg/dL (ref 0.44–1.00)
GFR calc Af Amer: >60 mL/min (ref >60)
Glucose, Bld: 104 mg/dL — ABNORMAL HIGH (ref 65–99)
POTASSIUM: 4.2 mmol/L (ref 3.5–5.1)
Sodium: 138 mmol/L (ref 135–145)
TOTAL PROTEIN: 6.4 g/dL — AB (ref 6.5–8.1)

## 2016-10-16 LAB — CBC WITH DIFFERENTIAL/PLATELET
BASOS ABS: 0 10*3/uL (ref 0.0–0.1)
Basophils Relative: 0 %
Eosinophils Absolute: 0 10*3/uL (ref 0.0–0.7)
Eosinophils Relative: 0 %
HEMATOCRIT: 32 % — AB (ref 36.0–46.0)
Hemoglobin: 10.3 g/dL — ABNORMAL LOW (ref 12.0–15.0)
LYMPHS ABS: 2.1 10*3/uL (ref 0.7–4.0)
LYMPHS PCT: 19 %
MCH: 26.9 pg (ref 26.0–34.0)
MCHC: 32.2 g/dL (ref 30.0–36.0)
MCV: 83.6 fL (ref 78.0–100.0)
MONO ABS: 0.6 10*3/uL (ref 0.1–1.0)
MONOS PCT: 5 %
NEUTROS ABS: 8.7 10*3/uL — AB (ref 1.7–7.7)
Neutrophils Relative %: 76 %
Platelets: 301 10*3/uL (ref 150–400)
RBC: 3.83 MIL/uL — ABNORMAL LOW (ref 3.87–5.11)
RDW: 14.9 % (ref 11.5–15.5)
WBC: 11.5 10*3/uL — ABNORMAL HIGH (ref 4.0–10.5)

## 2016-10-16 LAB — GLUCOSE, CAPILLARY
GLUCOSE-CAPILLARY: 97 mg/dL (ref 65–99)
GLUCOSE-CAPILLARY: 84 mg/dL (ref 65–99)
Glucose-Capillary: 100 mg/dL — ABNORMAL HIGH (ref 65–99)
Glucose-Capillary: 86 mg/dL (ref 65–99)
Glucose-Capillary: 91 mg/dL (ref 65–99)
Glucose-Capillary: 96 mg/dL (ref 65–99)

## 2016-10-16 NOTE — Plan of Care (Signed)
Problem: Food- and Nutrition-Related Knowledge Deficit (NB-1.1) Goal: Nutrition education Formal process to instruct or train a patient/client in a skill or to impart knowledge to help patients/clients voluntarily manage or modify food choices and eating behavior to maintain or improve health. Outcome: Completed/Met Date Met: 10/16/16 Nutrition Education Note  Received consult for diet education per DROP protocol.   Discussed 2 week post op diet with pt. Emphasized that liquids must be non carbonated, non caffeinated, and sugar free. Fluid goals discussed. Pt to follow up with outpatient bariatric RD for further diet progression after 2 weeks. Multivitamins and minerals also reviewed. Teach back method used, pt expressed understanding, expect good compliance.   Diet: First 2 Weeks  You will see the nutritionist about two (2) weeks after your surgery. The nutritionist will increase the types of foods you can eat if you are handling liquids well:  If you have severe vomiting or nausea and cannot handle clear liquids lasting longer than 1 day, call your surgeon  Protein Shake  Drink at least 2 ounces of shake 5-6 times per day  Each serving of protein shakes (usually 8 - 12 ounces) should have a minimum of:  15 grams of protein  And no more than 5 grams of carbohydrate  Goal for protein each day:  Men = 80 grams per day  Women = 60 grams per day  Protein powder may be added to fluids such as non-fat milk or Lactaid milk or Soy milk (limit to 35 grams added protein powder per serving)   Hydration  Slowly increase the amount of water and other clear liquids as tolerated (See Acceptable Fluids)  Slowly increase the amount of protein shake as tolerated  Sip fluids slowly and throughout the day  May use sugar substitutes in small amounts (no more than 6 - 8 packets per day; i.e. Splenda)   Fluid Goal  The first goal is to drink at least 8 ounces of protein shake/drink per day (or as directed  by the nutritionist); some examples of protein shakes are Premier Protein, [employer] Johnson, AMR Corporation, EAS Edge HP, and Unjury. See handout from pre-op Bariatric Education Class:  Slowly increase the amount of protein shake you drink as tolerated  You may find it easier to slowly sip shakes throughout the day  It is important to get your proteins in first  Your fluid goal is to drink 64 - 100 ounces of fluid daily  It may take a few weeks to build up to this  32 oz (or more) should be clear liquids  And  32 oz (or more) should be full liquids (see below for examples)  Liquids should not contain sugar, caffeine, or carbonation   Clear Liquids:  Water or Sugar-free flavored water (i.e. Fruit H2O, Propel)  Decaffeinated coffee or tea (sugar-free)  Crystal Lite, Wyler's Lite, Minute Maid Lite  Sugar-free Jell-O  Bouillon or broth  Sugar-free Popsicle: *Less than 20 calories each; Limit 1 per day   Full Liquids:  Protein Shakes/Drinks + 2 choices per day of other full liquids  Full liquids must be:  No More Than 12 grams of Carbs per serving  No More Than 3 grams of Fat per serving  Strained low-fat cream soup  Non-Fat milk  Fat-free Lactaid Milk  Sugar-free yogurt (Dannon Lite & Fit, [place] yogurt, Oikos Zero)   Clayton Bibles, MS, RD, LDN Pager: [phone] After Hours Pager: [phone]

## 2016-10-16 NOTE — Progress Notes (Signed)
IV was pulled d/t infiltration early this morning. PM nurse saline locked pt, as plan was to discharge. After Dr. Fredricka Bonineonnor rounded on pt this morning, decision was made not to discharge pt today. Pt states she would prefer not to have another IV placed. Will discuss w/oncall MD and   Paged oncall MD, Dr. Donell BeersByerly, to discuss above.   Dr. Donell BeersByerly returned call and gave verbal orders to get a new IV placed, allow 3-4 attempts, and if unsuccessful, ok for pt to be without one. If successful w/IV placement, resume ordered NS fluids @ 50 mL/hr.  Notified pt of plan and she was agreeable.  IV team consult placed.  Successful IV placement. Adjusted dosing order per above and restarted pt on fluids per above orders.

## 2016-10-16 NOTE — Addendum Note (Signed)
Addendum  created 10/16/16 0050 by Wynonia SoursWalker, Antoinne Spadaccini L, CRNA   Anesthesia Event edited

## 2016-10-16 NOTE — Progress Notes (Signed)
S: No issues overnight. Tolerating sips of water without nausea or pain. Has been walking and using IS. CPAP overnight.   Vitals, labs, intake/output, and orders reviewed at this time. Afebrile. HR 86. Mildly hypertensive. Sats 99% rom air. PO 260. Urine 1455. WBC 11.5; Hgb stable 10.3, CMP unremarkable- mild AST/ALT increase.  Gen: A&Ox3, no distress  H&N: EOMI, atraumatic, neck supple Chest: unlabored respirations, RRR Abd: soft, appropriately tender, nondistended, incisions c/d/i with steris and bandaids. Ext: warm, no edema Neuro: grossly normal  Lines/tubes/drains: PIV  A/P:  POD 1 s/p laparoscopic roux en y gastric bypass. Doing well today.  -Continue therapies, ambulation -Continue on clears and initiate protein shakes per protocol.  -Recheck CBC in AM    Phylliss Blakeshelsea Connor, MD [facility], [place] Pager [phone]

## 2016-10-16 NOTE — Progress Notes (Signed)
Patient alert and oriented, Post op day 1.  Provided support and encouragement.  Encouraged pulmonary toilet, ambulation and small sips of liquids.  Patient has tolerated 8 ounces of clear fluid.  We discussed the need for 4 more ounces before starting protein.  Patient stated understanding.  All questions answered.  Will continue to monitor.

## 2016-10-17 DIAGNOSIS — Z9884 Bariatric surgery status: Secondary | ICD-10-CM

## 2016-10-17 LAB — CBC WITH DIFFERENTIAL/PLATELET
Basophils Absolute: 0 10*3/uL (ref 0.0–0.1)
Basophils Relative: 0 %
Eosinophils Absolute: 0.1 10*3/uL (ref 0.0–0.7)
Eosinophils Relative: 1 %
HEMATOCRIT: 30.5 % — AB (ref 36.0–46.0)
HEMOGLOBIN: 9.9 g/dL — AB (ref 12.0–15.0)
LYMPHS ABS: 3.2 10*3/uL (ref 0.7–4.0)
LYMPHS PCT: 33 %
MCH: 27 pg (ref 26.0–34.0)
MCHC: 32.5 g/dL (ref 30.0–36.0)
MCV: 83.3 fL (ref 78.0–100.0)
MONOS PCT: 4 %
Monocytes Absolute: 0.4 10*3/uL (ref 0.1–1.0)
NEUTROS PCT: 62 %
Neutro Abs: 5.9 10*3/uL (ref 1.7–7.7)
Platelets: 295 10*3/uL (ref 150–400)
RBC: 3.66 MIL/uL — AB (ref 3.87–5.11)
RDW: 14.9 % (ref 11.5–15.5)
WBC: 9.6 10*3/uL (ref 4.0–10.5)

## 2016-10-17 LAB — GLUCOSE, CAPILLARY
GLUCOSE-CAPILLARY: 90 mg/dL (ref 65–99)
Glucose-Capillary: 93 mg/dL (ref 65–99)

## 2016-10-17 NOTE — Progress Notes (Signed)
Received diabetes coordinator consult. CBGs less than 100 mg/dl. Spoke with Dr. Daphine DeutscherMartin about patient. She will need to follow up with PCP. No insulin at this time. Check blood sugars at home at least 2-3 times per day.   Smith MinceKendra Lauren Modisette RN BSN CDE Diabetes Coordinator Pager: [phone]  8am-5pm

## 2016-10-17 NOTE — Progress Notes (Signed)
Patient alert and oriented, Post op day 2.  Provided support and encouragement.  Encouraged pulmonary toilet, ambulation and small sips of liquids.  Patient completed a full shake of protein yesterday with an additional 2 ounces.  Restarted shakes this am.  All questions answered.  Will continue to monitor.

## 2016-10-17 NOTE — Progress Notes (Signed)
Reviewed AVS with pt and family. Questions answered to their satisfaction and verbalized understanding through teach back. Pt discharged home with all belongings in stable condition and pain controlled via ambulation with family.

## 2016-10-17 NOTE — Progress Notes (Signed)
Patient alert and oriented, pain is controlled. Patient is tolerating fluids, advanced to protein shake today, patient is tolerating well. Reviewed Gastric Bypass discharge instructions with patient and patient is able to articulate understanding. Provided information on BELT program, Support Group and [facility] outpatient pharmacy. All questions answered, will continue to monitor.    

## 2016-10-17 NOTE — Discharge Summary (Signed)
Physician Discharge Summary  Patient ID: Sue Bell MRN: [id number] DOB/AGE: 09/26/1970 46 y.o.  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 Fredricka Bonine.  Her blood sugars have been well controlled to low so we are sending her home off insulin and to consult her primary care physician about DM control  Consults: none  Significant Diagnostic Studies: none    Discharge Exam: Blood pressure (!) 147/86, pulse 90, temperature 98.3 F (36.8 C), temperature source Oral, resp. rate 18, height 5\' 3"  (1.6 m), weight 126.2 kg (278 lb 4.8 oz), SpO2 97 %. Incisions OK.  Taking shakes ok.    Disposition:   Discharge Instructions    Ambulate hourly while awake    Complete by:  As directed    Call MD for:  difficulty breathing, headache or visual disturbances    Complete by:  As directed    Call MD for:  persistant dizziness or light-headedness    Complete by:  As directed    Call MD for:  persistant nausea and vomiting    Complete by:  As directed    Call MD for:  redness, tenderness, or signs of infection (pain, swelling, redness, odor or green/yellow discharge around incision site)    Complete by:  As directed    Call MD for:  severe uncontrolled pain    Complete by:  As directed    Call MD for:  temperature >101 F    Complete by:  As directed    Diet bariatric full liquid    Complete by:  As directed    Incentive spirometry    Complete by:  As directed    Perform hourly while awake     Allergies as of 10/17/2016      Reactions   Sulfa Antibiotics Hives      Medication List    STOP taking these medications   LEVEMIR FLEXTOUCH 100 UNIT/ML Pen Generic  drug:  Insulin Detemir   TRULICITY 1.5 MG/0.5ML Sopn Generic drug:  Dulaglutide     TAKE these medications   cetirizine 10 MG tablet Commonly known as:  ZYRTEC Take 10 mg by mouth daily as needed for allergies (takes Zyrtec during day if needed).   levocetirizine 5 MG tablet Commonly known as:  XYZAL Take 5 mg by mouth every evening.   mometasone 50 MCG/ACT nasal spray Commonly known as:  NASONEX 1 SPRAY EACH NOSTRIL DAILY IF NEEDED FOR ALLERGIES   ranitidine 150 MG tablet Commonly known as:  ZANTAC Take 150 mg by mouth daily as needed for heartburn.   valsartan 160 MG tablet Commonly known as:  DIOVAN Take 160 mg by mouth daily. Notes to patient:  Monitor Blood Pressure Daily and keep a log for primary care physician.  You may need to make changes to your medications with rapid weight loss.        Follow-up Information    Berna Bue, MD. Go on 10/31/2016.   Specialty:  General Surgery Why:  at 1215 Contact information: [phone]        Berna Bue, MD Follow up.   Specialty:  General Surgery Contact information: [phone]  SignedValarie Merino: Mame Twombly B 10/17/2016, 8:54 AM

## 2016-10-28 ENCOUNTER — Ambulatory Visit: Payer: BC Managed Care – PPO | Admitting: Pulmonary Disease

## 2016-10-29 ENCOUNTER — Encounter: Payer: Self-pay | Admitting: Skilled Nursing Facility1

## 2016-10-29 ENCOUNTER — Encounter: Payer: BC Managed Care – PPO | Attending: Surgery | Admitting: Skilled Nursing Facility1

## 2016-10-29 DIAGNOSIS — Z713 Dietary counseling and surveillance: Secondary | ICD-10-CM | POA: Insufficient documentation

## 2016-10-29 DIAGNOSIS — E119 Type 2 diabetes mellitus without complications: Secondary | ICD-10-CM

## 2016-10-29 NOTE — Progress Notes (Signed)
Bariatric Class:  Appt start time: 1530 end time:  1630.  2 Week Post-Operative Nutrition Class  Patient was seen on 10/29/2016 for Post[facility]ative [facility]  Checking blood sugar 2 times a week: 80's. Pt states she was taken off her insulin.   Surgery date: 10/15/16 Surgery type: RYGB Start weight at [facility]: 275.7 Weight today: 259 TANITA  BODY COMP RESULTS  10/29/2016   BMI (kg/m^2) 45.9   Fat Mass (lbs) 137.6   Fat Free Mass (lbs) 121.4   Total Body Water (lbs)    The following the learning objectives were met by the patient during this course:  Identifies Phase 3A (Soft, High Proteins) Dietary Goals and will begin from 2 weeks post-operatively to 2 months post-operatively  Identifies appropriate sources of fluids and proteins   States protein recommendations and appropriate sources post-operatively  Identifies the need for appropriate texture modifications, mastication, and bite sizes when consuming solids  Identifies appropriate multivitamin and calcium sources post-operatively  Describes the need for physical activity post-operatively and will follow MD recommendations  States when to call healthcare provider regarding medication questions or post-operative complications  Handouts given during class include:  Phase 3A: Soft, High Protein Diet Handout  Follow-Up Plan: Patient will follow-up at [facility] in 6 weeks for 2 month post-op nutrition visit for diet advancement per MD.

## 2016-12-11 ENCOUNTER — Encounter: Payer: BC Managed Care – PPO | Attending: Surgery | Admitting: Skilled Nursing Facility1

## 2016-12-11 ENCOUNTER — Encounter: Payer: Self-pay | Admitting: Skilled Nursing Facility1

## 2016-12-11 DIAGNOSIS — Z713 Dietary counseling and surveillance: Secondary | ICD-10-CM | POA: Diagnosis not present

## 2016-12-11 DIAGNOSIS — E119 Type 2 diabetes mellitus without complications: Secondary | ICD-10-CM

## 2016-12-11 NOTE — Patient Instructions (Addendum)
-  Get a another bottle for water: aim for 70-80 ounces of fluid  -Keep chewing your foods very well  -Eat all of your protein first then start in on your vegetables  -Start out with soft cooked vegetables

## 2016-12-11 NOTE — Progress Notes (Signed)
Follow-up visit:  8 Weeks Post-Operative RYGB Surgery Primary concerns today: Post-operative Bariatric Surgery Nutrition Management.  Pt states she does not have a taste for eggs. Pt states she has been working 12 hours and has not been hungry for dinner.   Surgery date: 10/15/16 Surgery type: RYGB Start weight at [facility]: 275.7 Weight today: 243.2 Weight change: 15.8  TANITA  BODY COMP RESULTS  10/29/2016 12/11/2016   BMI (kg/m^2) 45.9 43.1   Fat Mass (lbs) 137.6 113.4   Fat Free Mass (lbs) 121.4 129.8   Total Body Water (lbs)  95    24-hr recall: B (AM): [place] sausage (8g) Snk (AM):  Protein shake (30g) L (PM): chicken or fish or beef (14g) Snk (PM): pork rinds pr protein pack (10g) D (PM): sugar free popcicle or some protein (14g) Snk (PM): Parmesan crisp crackers   Fluid intake: water, crystal light: 62 ounces  Estimated total protein intake: 76  Medications: no longer taking blood pressure medicine  Supplementation: bari advantage and calcium   CBG monitoring: once a week Average CBG per patient: under 100 Last patient reported A1c: 6.8  Using straws: no Drinking while eating: no Having you been chewing well:no Chewing/swallowing difficulties: no Changes in vision: no Changes to mood/headaches: no Hair loss/Cahnges to skin/Changes to nails: no Any difficulty focusing or concentrating: no Sweating: no Dizziness/Lightheaded:  Palpitations: no  Carbonated beverages: no N/V/D/C/GAS: having bowel movements 3 times a week Abdominal Pain: no Dumping syndrome: no  Recent physical activity:  Was doing BELT but will be traveling for work and walking inneighborhood  Progress Towards Goal(s):  In progress.  Handouts given during visit include:  Non starchy + protein   Nutritional Diagnosis:  [place]-3.3 Overweight/obesity related to past poor dietary habits and physical inactivity as evidenced by patient w/ recent RYGB surgery following dietary guidelines for continued  weight loss.  Intervention:  Nutrition counseling. Dietitian educated the pt on advancing her diet to include non-starchy vegetables. Goals: -Get a another bottle for water: aim for 70-80 ounces of fluid -Keep chewing your foods very well -Eat all of your protein first then start in on your vegetables -Start out with soft cooked vegetables  Teaching Method Utilized:  Visual Auditory Hands on  Barriers to learning/adherence to lifestyle change: none identified   Demonstrated degree of understanding via:  Teach Back   Monitoring/Evaluation:  Dietary intake, exercise, and body weight.

## 2017-01-16 ENCOUNTER — Ambulatory Visit: Payer: BC Managed Care – PPO | Admitting: Pulmonary Disease

## 2017-02-13 ENCOUNTER — Ambulatory Visit: Payer: BC Managed Care – PPO | Admitting: Skilled Nursing Facility1

## 2017-02-19 ENCOUNTER — Encounter: Payer: Self-pay | Admitting: Skilled Nursing Facility1

## 2017-02-19 ENCOUNTER — Encounter: Payer: BC Managed Care – PPO | Attending: Surgery | Admitting: Skilled Nursing Facility1

## 2017-02-19 DIAGNOSIS — Z713 Dietary counseling and surveillance: Secondary | ICD-10-CM | POA: Diagnosis not present

## 2017-02-19 DIAGNOSIS — E119 Type 2 diabetes mellitus without complications: Secondary | ICD-10-CM

## 2017-02-19 NOTE — Progress Notes (Signed)
Post-Operative RYGB Surgery Primary concerns today: Post-operative Bariatric Surgery Nutrition Management.  Pt states she does not have a taste for eggs. Pt states she has been working 12 hours and has not been hungry for dinner.   Pt state she does not check her blood sugar. Pt states she has been trying to get back to eating breakfast. Pt states she has vomited 2 times once with fatty steak and once with chips and salsa. Pt states she travels a lot for work. Pt states her bowel movements are much better at 4-5 times a week.   Surgery date: 10/15/16 Surgery type: [facility]GB [facility] Weight today: 220.4 Weight change: 22.8  TANITA  BODY COMP RESULTS  10/29/2016 12/11/2016 02/19/2017   BMI (kg/m^2) 45.9 43.1 39   Fat Mass (lbs) 137.6 113.4 101.6   Fat Free Mass (lbs) 121.4 129.8 118.8   Total Body Water (lbs)  95 86.2    24-hr recall: B (AM): Malaw[place]urkey sausage (8g)----skipped---protein shake or 2 pieces of bacon or 1 piece of sausage or yogurt Snk (AM):  Almonds or skinny pop popcorn L (PM): chicken or fish or beef (14g) sometimes with a salad Snk (PM): popcorn or alomnds D (PM): soup or chili Snk (PM): Parmesan crisp crackers   Fluid intake: water, crystal light: 45 ounces  Estimated total protein intake: 76  Medications: no longer taking blood pressure medicine  Supplementation: bari advantage and calcium   CBG monitoring: not checking Average CBG per patient: under 100 Last patient reported A1c: 6.8  Using straws: no Drinking while eating: no Having you been chewing well:no Chewing/swallowing difficulties: no Changes in vision: no Changes to mood/headaches: no Hair loss/Cahnges to skin/Changes to nails: no Any difficulty focusing or concentrating: no Sweating: no Dizziness/Lightheaded:  Palpitations: no  Carbonated beverages: no N/V/D/C/GAS:  Abdominal Pain: no Dumping syndrome: no  Recent physical activity:  Was doing BELT but will be traveling for  work and walking inneighborhood  Progress Towards Goal(s):  In progress.  Handouts given during visit include:  Non starchy + protein   Nutritional Diagnosis:  [place]-3.3 Overweight/obesity related to past poor dietary habits and physical inactivity as evidenced by patient w/ recent RYGB surgery following dietary guidelines for continued weight loss.  Intervention:  Nutrition counseling. Dietitian educated the pt on advancing her diet to include non-starchy vegetables. Goals: -have vegetables with every lunch and every dinner -Try to keep something to drink on you   Teaching Method Utilized:  Visual Auditory Hands on  Barriers to learning/adherence to lifestyle change: none identified   Demonstrated degree of understanding via:  Teach Back   Monitoring/Evaluation:  Dietary intake, exercise, and body weight.

## 2017-02-19 NOTE — Patient Instructions (Signed)
-  have vegetables with every lunch and every dinner  -Try to keep something to drink on you

## 2017-02-20 LAB — HM PAP SMEAR

## 2017-02-25 LAB — HM PAP SMEAR

## 2017-05-22 ENCOUNTER — Encounter: Payer: Self-pay | Admitting: Skilled Nursing Facility1

## 2017-05-22 ENCOUNTER — Encounter: Payer: BC Managed Care – PPO | Attending: Surgery | Admitting: Skilled Nursing Facility1

## 2017-05-22 DIAGNOSIS — Z713 Dietary counseling and surveillance: Secondary | ICD-10-CM | POA: Insufficient documentation

## 2017-05-22 NOTE — Patient Instructions (Addendum)
-  Aim to cook new recipes of vegetables   -only have 1 spoon full of starchy vegetable to start  -Try 1/4 baked potato with greek yogurt, bacon bits and broccoli   -Aim for 64 fluid ounces   -Set your reminders to remind you to drink once your home

## 2017-05-22 NOTE — Progress Notes (Signed)
Post-Operative RYGB Surgery Primary concerns today: Post-operative Bariatric Surgery Nutrition Management.  Pt has been looking for alternatives and has found a lot of good ones high in fiber. Pt states she had half a beef patty and felt very constipated. Pt states she has only had 3 bowel movements a week.   Surgery date: 10/15/16 Surgery type: [facility]GB [facility] Weight today: 202.4 Weight change: 18  TANITA  BODY COMP RESULTS  10/29/2016 12/11/2016 02/19/2017 05/22/2017   BMI (kg/m^2) 45.9 43.1 39 35.9   Fat Mass (lbs) 137.6 113.4 101.6 80   Fat Free Mass (lbs) 121.4 129.8 118.8 122.4   Total Body Water (lbs)  95 86.2 88    24-hr recall: B (AM): Malaw[place]urkey sausage (8g)----sometimes skipped---protein shake or 2 pieces of bacon or 1 piece of sausage or yogurt or protein wafer (13g) or egg bites from satrbucks  Snk (AM):  Almonds or skinny pop popcorn or half a protein wafer L (PM): chicken or fish or beef (14g) sometimes with a salad or pork loin Snk (PM): popcorn or alomnds D (PM): soup or chili or shrimp  Snk (PM): Parmesan crisp crackers   Fluid intake: water, crystal light: 40 ounces, collagan powder added to water Estimated total protein intake: 76  Medications: no longer taking blood pressure medicine  Supplementation: bari advantage and calcium   CBG monitoring: not checking Average CBG per patient: under 100 Last patient reported A1c: 6.8  Using straws: no Drinking while eating: no Having you been chewing well:no Chewing/swallowing difficulties: no Changes in vision: no Changes to mood/headaches: no Hair loss/Cahnges to skin/Changes to nails: no Any difficulty focusing or concentrating: no Sweating: no Dizziness/Lightheaded:  Palpitations: no  Carbonated beverages: no N/V/D/C/GAS: have a bowel movement 3 times a week Abdominal Pain: no Dumping syndrome: no  Recent physical activity:  inconsistent walking but wants to try boxing   Progress  Towards Goal(s):  In progress.  Handouts given during visit include:  All vegetables + protein   Nutritional Diagnosis:  [place]-3.3 Overweight/obesity related to past poor dietary habits and physical inactivity as evidenced by patient w/ recent RYGB surgery following dietary guidelines for continued weight loss.  Intervention:  Nutrition counseling. Dietitian educated the pt on advancing her diet to include non-starchy vegetables. Goals: -Aim to cook new recipes of vegetables  -only have 1 spoon full of starchy vegetable to start -Try 1/4 baked potato with greek yogurt, bacon bits and broccoli  -Aim for 64 fluid ounces  -Set your reminders to remind you to drink once your home Teaching Method Utilized:  Visual Auditory Hands on  Barriers to learning/adherence to lifestyle change: none identified   Demonstrated degree of understanding via:  Teach Back   Monitoring/Evaluation:  Dietary intake, exercise, and body weight.

## 2017-07-03 MED FILL — CELEBRATE CALCL CIT CHOC 90: 30 days supply | Qty: 90 | Fill #0

## 2017-07-03 MED FILL — BARIAT ADVANT ADVMULT 60CT: 30 days supply | Qty: 60 | Fill #0

## 2017-08-20 ENCOUNTER — Ambulatory Visit: Payer: BC Managed Care – PPO | Admitting: Skilled Nursing Facility1

## 2017-09-15 ENCOUNTER — Encounter: Payer: BC Managed Care – PPO | Attending: Surgery | Admitting: Skilled Nursing Facility1

## 2017-09-15 ENCOUNTER — Encounter: Payer: Self-pay | Admitting: Skilled Nursing Facility1

## 2017-09-15 DIAGNOSIS — Z6836 Body mass index (BMI) 36.0-36.9, adult: Secondary | ICD-10-CM | POA: Insufficient documentation

## 2017-09-15 DIAGNOSIS — Z713 Dietary counseling and surveillance: Secondary | ICD-10-CM | POA: Insufficient documentation

## 2017-09-15 NOTE — Progress Notes (Signed)
Post-Operative RYGB Surgery Primary concerns today: Post-operative Bariatric Surgery Nutrition Management.  Pt states she visited [place] and [place] with bread at every meal. Pt states she needs a smaller meal in the morning. Pt sates she drinks a protein shake in the morning because it is easier. Pt states he starts boot camp 3 days a week tomorrow for 6 weeks.   Surgery date: 10/15/16 Surgery type: [facility]YGB [facility] Weight today: 187 Weight change: 15  TANITA  BODY COMP RESULTS  12/11/2016 02/19/2017 05/22/2017 09/15/2017   BMI (kg/m^2) 43.1 39 35.9 33.1   Fat Mass (lbs) 113.4 101.6 80 71.4   Fat Free Mass (lbs) 129.8 118.8 122.4 115.6   Total Body Water (lbs) 95 86.2 88 82.4    24-hr recall: B (AM): Malaw[place]urkey sausage (8g)----sometimes skipped---protein shake or 2 pieces of bacon or 1 piece of sausage or yogurt or protein wafer (13g) or egg bites from satrbucks  Snk (AM):  Almonds or skinny pop popcorn or half a protein wafer or egg white bite L (PM): chicken or fish or beef (14g) sometimes with a salad or pork loin or salad with protein  Snk (PM): popcorn or alomnds D (PM): soup or chili or shrimp k Snk (PM): Parmesan crisp crackers   Fluid intake: water, crystal light: 55-60 ounces, collagan powder added to water Estimated total protein intake: 76  Medications: no longer taking blood pressure medicine  Supplementation: bari advantage and calcium   CBG monitoring: not checking Average CBG per patient: under 100 Last patient reported A1c: 6.8  Using straws: no Drinking while eating: no Having you been chewing well:no Chewing/swallowing difficulties: no Changes in vision: no Changes to mood/headaches: no Hair loss/Cahnges to skin/Changes to nails: no Any difficulty focusing or concentrating: no Sweating: no Dizziness/Lightheaded:  Palpitations: no  Carbonated beverages: no N/V/D/C/GAS: have a bowel movement every day Abdominal Pain: no Dumping syndrome:  no  Recent physical activity:  inconsistent walking but wants to try boxing   Progress Towards Goal(s):  In progress.  Handouts given during visit include:  All vegetables + protein   Nutritional Diagnosis:  -3.3 Overweight/obesity related to past poor dietary habits and physical inactivity as evidenced by patient w/ recent RYGB surgery following dietary guidelines for continued weight loss.  Intervention:  Nutrition counseling. Dietitian educated the pt on advancing her diet to include non-starchy vegetables. Goals: -Have fruit for breakfast or snacks  Teaching Method Utilized:  Visual Auditory Hands on  Barriers to learning/adherence to lifestyle change: none identified   Demonstrated degree of understanding via:  Teach Back   Monitoring/Evaluation:  Dietary intake, exercise, and body weight.

## 2017-09-18 ENCOUNTER — Other Ambulatory Visit: Payer: Self-pay | Admitting: Internal Medicine

## 2017-09-18 DIAGNOSIS — Z1231 Encounter for screening mammogram for malignant neoplasm of breast: Secondary | ICD-10-CM

## 2017-10-09 ENCOUNTER — Ambulatory Visit
Admission: RE | Admit: 2017-10-09 | Discharge: 2017-10-09 | Disposition: A | Discharge status: Home / Self Care | Payer: BC Managed Care – PPO | Source: Ambulatory Visit | Attending: Internal Medicine | Admitting: Internal Medicine

## 2017-10-09 DIAGNOSIS — Z1231 Encounter for screening mammogram for malignant neoplasm of breast: Secondary | ICD-10-CM

## 2017-11-10 MED FILL — BARIAT ADVANT ADVMULT 60CT: 30 days supply | Qty: 60 | Fill #1

## 2017-12-23 DIAGNOSIS — Z0000 Encounter for general adult medical examination without abnormal findings: Secondary | ICD-10-CM

## 2017-12-23 DIAGNOSIS — E119 Type 2 diabetes mellitus without complications: Secondary | ICD-10-CM | POA: Diagnosis not present

## 2018-01-16 ENCOUNTER — Other Ambulatory Visit: Payer: Self-pay | Admitting: Internal Medicine

## 2018-02-16 ENCOUNTER — Encounter: Payer: Self-pay | Admitting: Nurse Practitioner

## 2018-02-16 ENCOUNTER — Ambulatory Visit: Payer: BC Managed Care – PPO | Admitting: Nurse Practitioner

## 2018-02-16 VITALS — BP 132/82 | HR 91 | Temp 98.1°F | Ht 62.0 in | Wt 186.2 lb

## 2018-02-16 DIAGNOSIS — J029 Acute pharyngitis, unspecified: Secondary | ICD-10-CM

## 2018-02-16 DIAGNOSIS — J069 Acute upper respiratory infection, unspecified: Secondary | ICD-10-CM

## 2018-02-16 LAB — POCT RAPID STREP A (OFFICE): RAPID STREP A SCREEN: NEGATIVE

## 2018-02-16 MED ORDER — AZITHROMYCIN 250 MG PO TABS: ORAL_TABLET | ORAL | 0 refills | Status: DC

## 2018-02-16 NOTE — Patient Instructions (Signed)
Upper Respiratory Infection, Adult Most upper respiratory infections (URIs) are caused by a virus. A URI affects the nose, throat, and upper air passages. The most common type of URI is often called "the common cold." Follow these instructions at home:  Take medicines only as told by your doctor.  Gargle warm saltwater or take cough drops to comfort your throat as told by your doctor.  Use a warm mist humidifier or inhale steam from a shower to increase air moisture. This may make it easier to breathe.  Drink enough fluid to keep your pee (urine) clear or pale yellow.  Eat soups and other clear broths.  Have a healthy diet.  Rest as needed.  Go back to work when your fever is gone or your doctor says it is okay. ? You may need to stay home longer to avoid giving your URI to others. ? You can also wear a face mask and wash your hands often to prevent spread of the virus.  Use your inhaler more if you have asthma.  Do not use any tobacco products, including cigarettes, chewing tobacco, or electronic cigarettes. If you need help quitting, ask your doctor. Contact a doctor if:  You are getting worse, not better.  Your symptoms are not helped by medicine.  You have chills.  You are getting more short of breath.  You have brown or red mucus.  You have yellow or brown discharge from your nose.  You have pain in your face, especially when you bend forward.  You have a fever.  You have puffy (swollen) neck glands.  You have pain while swallowing.  You have white areas in the back of your throat. Get help right away if:  You have very bad or constant: ? Headache. ? Ear pain. ? Pain in your forehead, behind your eyes, and over your cheekbones (sinus pain). ? Chest pain.  You have long-lasting (chronic) lung disease and any of the following: ? Wheezing. ? Long-lasting cough. ? Coughing up blood. ? A change in your usual mucus.  You have a stiff neck.  You have  changes in your: ? Vision. ? Hearing. ? Thinking. ? Mood. This information is not intended to replace advice given to you by your health care provider. Make sure you discuss any questions you have with your health care provider. Document Released: 09/18/2007 Document Revised: 12/03/2015 Document Reviewed: 07/07/2013 Elsevier Interactive Patient Education  2018 Elsevier Inc.  

## 2018-02-16 NOTE — Progress Notes (Signed)
  Subjective:     Patient ID: Sue Bell , female    DOB: [birth year] , 47 y.o.   MRN: [id number]   Chief Complaint  Patient presents with  . URI    postnasal drip, overall not feeling well nasal congestion and sore throat    HPI  URI   This is a new problem. The current episode started in the past 7 days. The problem has been gradually worsening. There has been no fever (she had chills yesterday). Associated symptoms include congestion, rhinorrhea and a sore throat. Pertinent negatives include no coughing or ear pain. She has tried decongestant for the symptoms. The treatment provided no relief.     Past Medical History:  Diagnosis Date  . Diabetes mellitus without complication (HCC)   . Hyperlipidemia   . Hypertension   . OSA (obstructive sleep apnea) 07/08/2016     Family History  Problem Relation Age of Onset  . Heart disease Mother   . Diabetes Father   . Cancer Father   . Breast cancer Sister      Current Outpatient Medications:  .  cetirizine (ZYRTEC) 10 MG tablet, Take 10 mg by mouth daily as needed for allergies (takes Zyrtec during day if needed)., Disp: , Rfl:  .  levocetirizine (XYZAL) 5 MG tablet, TAKE 1 TABLET(5 MG) BY MOUTH EVERY DAY IN THE EVENING, Disp: 90 tablet, Rfl: 0 .  mometasone (NASONEX) 50 MCG/ACT nasal spray, 1 SPRAY EACH NOSTRIL DAILY IF NEEDED FOR ALLERGIES, Disp: , Rfl: 3   Allergies  Allergen Reactions  . Sulfa Antibiotics Hives     Review of Systems  Constitutional: Negative.   HENT: Positive for congestion, rhinorrhea and sore throat. Negative for ear pain.   Respiratory: Negative.  Negative for cough.   Cardiovascular: Negative.   Skin: Negative.   Neurological: Negative.      Today's Vitals   02/16/18 1123  BP: 132/82  Pulse: 91  Temp: 98.1 F (36.7 C)  TempSrc: Oral  SpO2: 98%  Weight: 186 lb 3.2 oz (84.5 kg)  Height: 5\' 2"  (1.575 m)  PainSc: 0-No pain   Body mass index is 34.06 kg/m.   Objective:  Physical Exam   Constitutional: She appears well-developed and well-nourished.  HENT:  Head: Normocephalic.  Right Ear: External ear normal.  Left Ear: External ear normal.  Mouth/Throat: Mucous membranes are normal. Posterior oropharyngeal erythema present. Tonsils are 0 on the right. Tonsils are 0 on the left.  Eyes: Pupils are equal, round, and reactive to light. Conjunctivae and EOM are normal.  Neck: Normal range of motion. Neck supple.  Cardiovascular: Normal rate, regular rhythm and normal heart sounds.  Pulmonary/Chest: Effort normal and breath sounds normal.  Skin: Skin is warm and dry. No rash noted.        Assessment And Plan:     1. Sore throat  Negative strep - POCT rapid strep A - azithromycin (ZITHROMAX) 250 MG tablet; Take 2 tabs day 1 then one tab by mouth daily for 4 days.  Dispense: 6 each; Refill: 0  2. Upper respiratory tract infection, unspecified type  Likely viral will treat with azithromycin empirically.    Norel samples given advised to not take for more than 3 days. - azithromycin (ZITHROMAX) 250 MG tablet; Take 2 tabs day 1 then one tab by mouth daily for 4 days.  Dispense: 6 each; Refill: 0        Arnette Felts, FNP

## 2018-03-11 MED FILL — BARIAT ADVANT ADVMULT 60CT: 30 days supply | Qty: 60 | Fill #2

## 2018-03-11 MED FILL — CELEBRATE CALCL CIT CHOC 90: 30 days supply | Qty: 90 | Fill #1

## 2018-03-16 ENCOUNTER — Ambulatory Visit: Payer: BC Managed Care – PPO | Admitting: Skilled Nursing Facility1

## 2018-03-26 IMAGING — DX DG CHEST 2V
2 series · 2 of 2 positions shown · non-contrast
Comparison: None.

CLINICAL DATA: 45-year-old female with a history of preoperative
bariatric surgery

EXAM:
CHEST  2 VIEW

[chest pa]
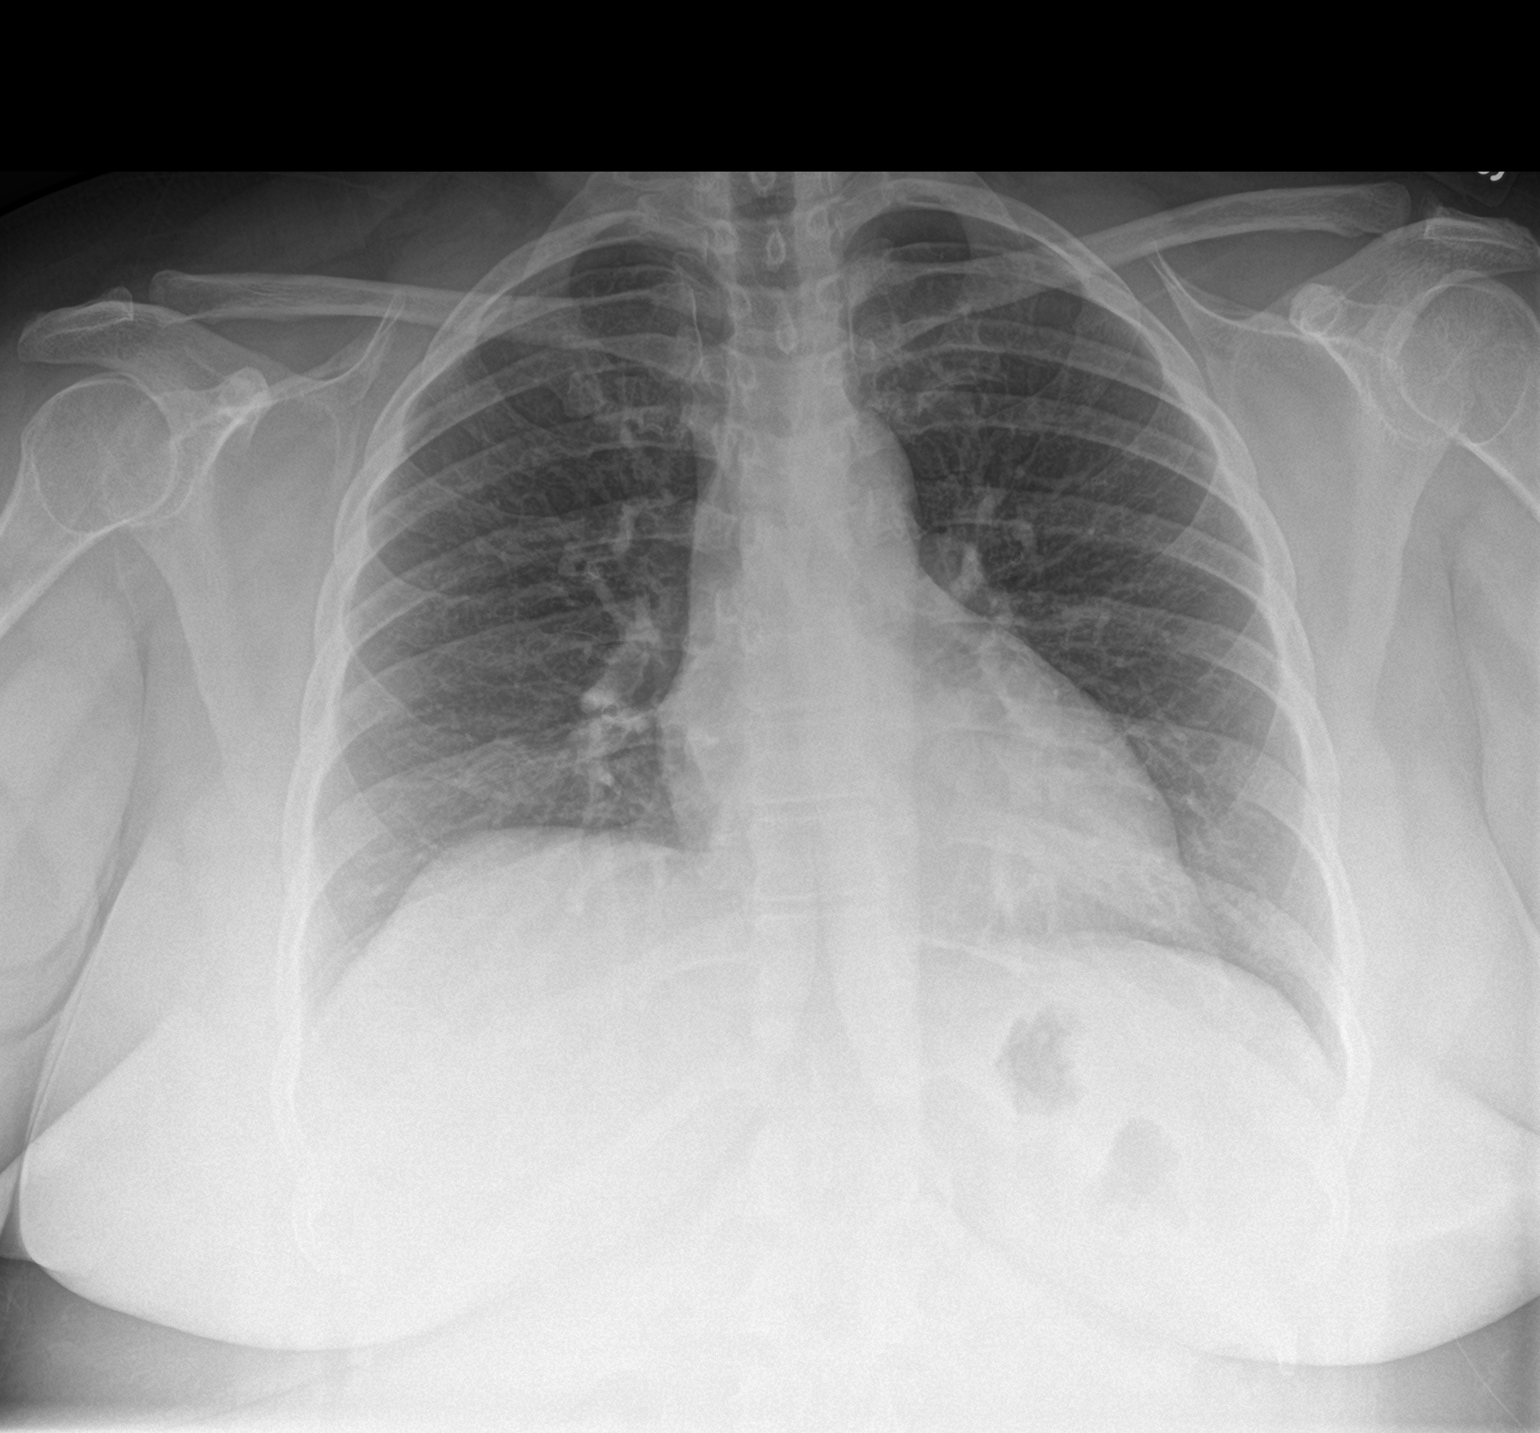

[chest lat]
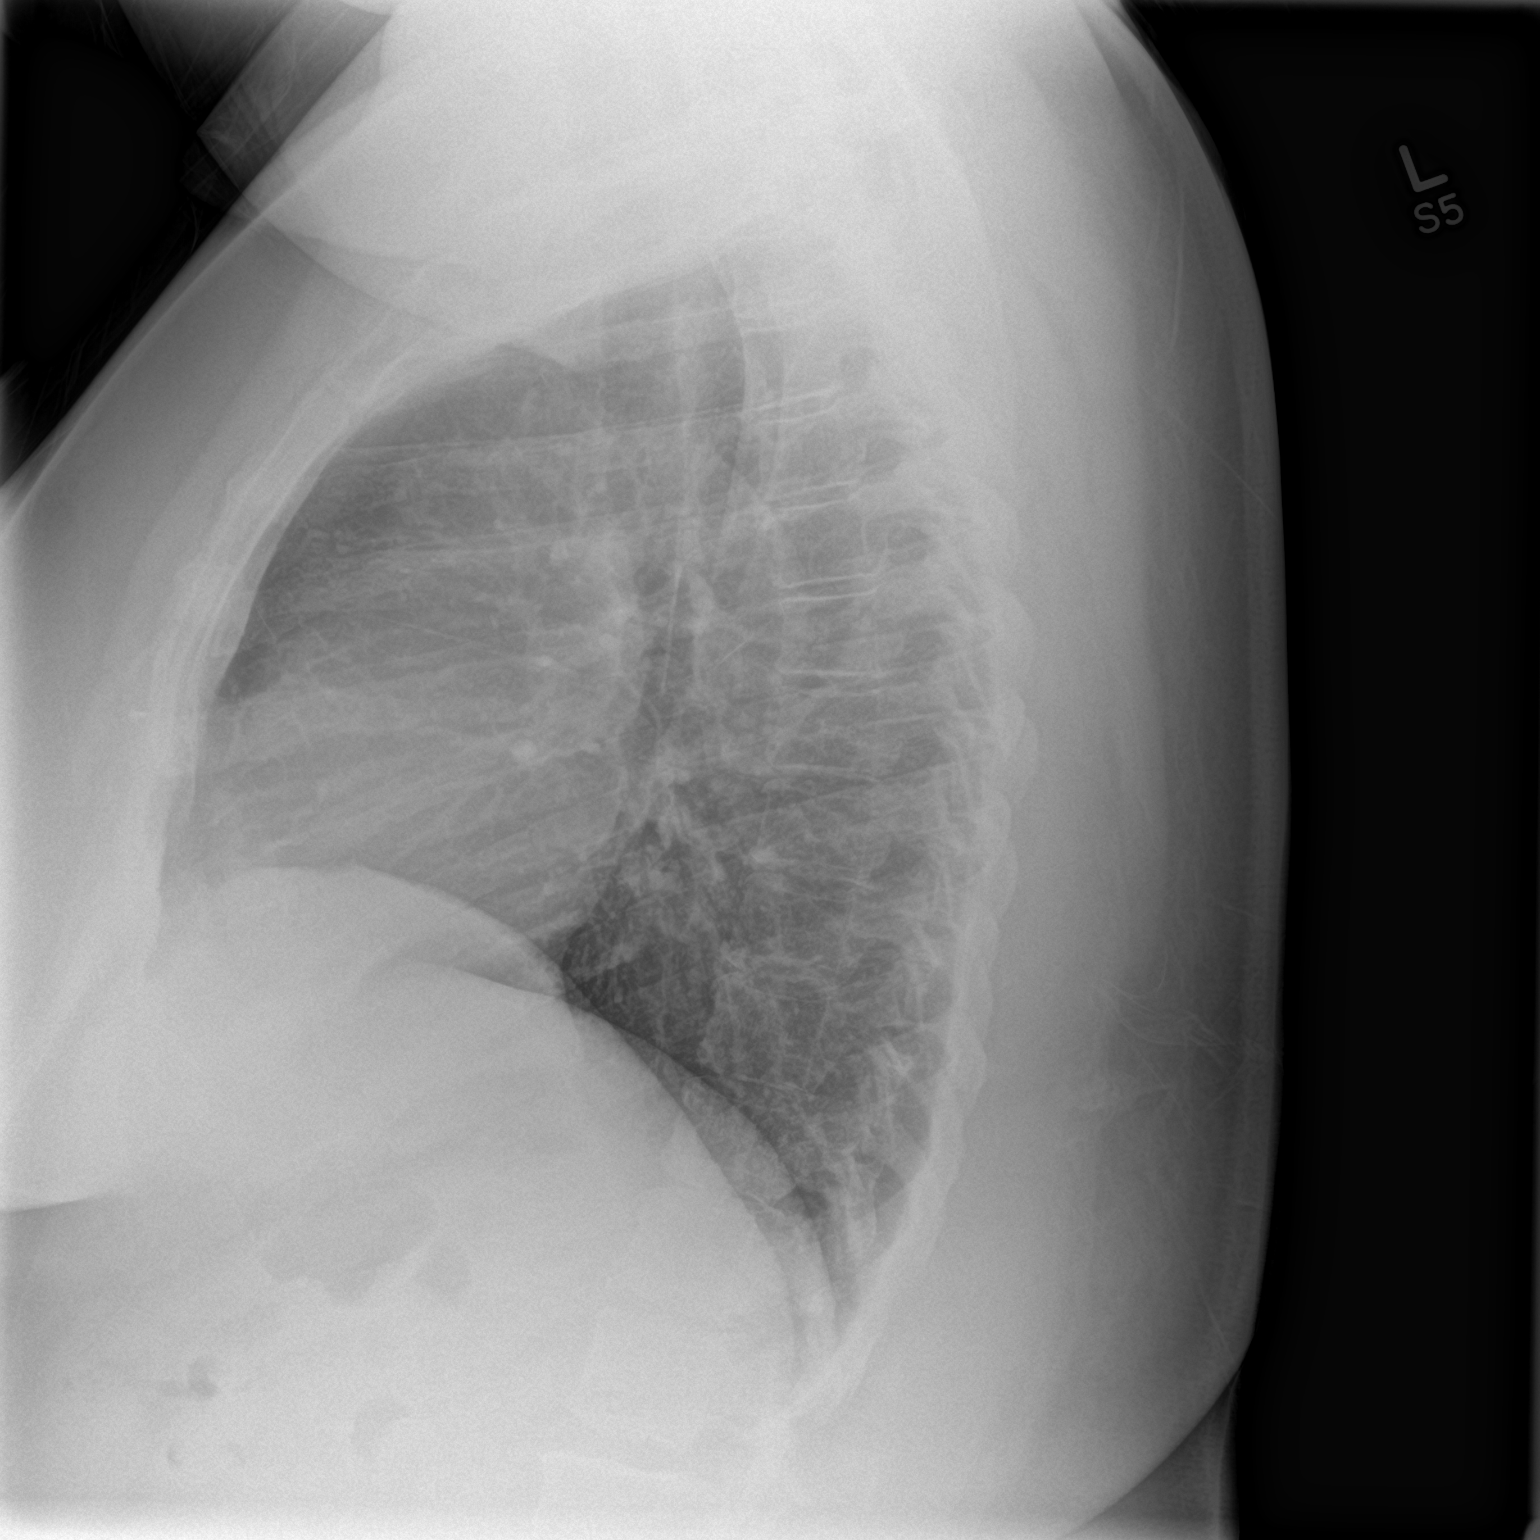

[2 of 2 positions shown; findings below may reference images not displayed]

FINDINGS: Cardiomediastinal silhouette within normal limits.

No evidence of central vascular congestion.

Low lung volumes, with no confluent airspace disease, pneumothorax,
or pleural effusion.

No displaced fracture
IMPRESSION: No radiographic evidence of acute cardiopulmonary disease

## 2018-03-26 IMAGING — RF DG UGI W/ KUB
7 of 9 series · 12 of 18 positions shown · non-contrast
Comparison: Upper GI 05/18/2010

CLINICAL DATA: Preop for bariatric surgery.

EXAM:
UPPER GI SERIES WITH KUB
TECHNIQUE: After obtaining a scout radiograph a routine upper GI series was
performed using thin density barium
FLUOROSCOPY TIME:  Fluoroscopy Time:  2 minutes and 6 seconds
Radiation Exposure Index (if provided by the fluoroscopic device):
80.9 mGy a
Number of Acquired Spot Images: 0

[Series 1: t abdomen supine · 0.15mm/px · 1 of 1 slices shown]
[im 1/1]
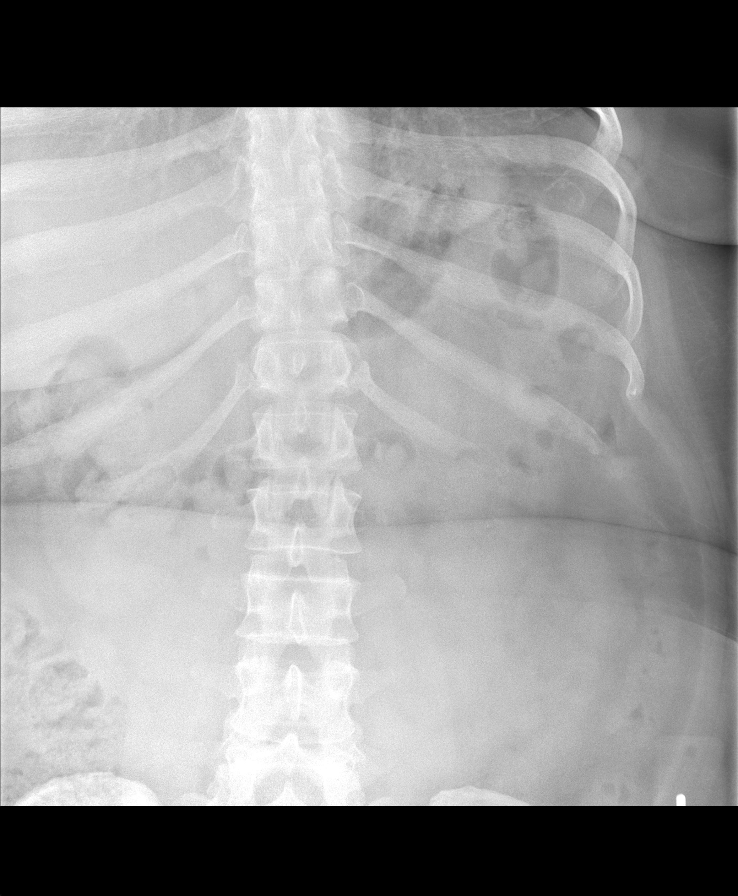

[Series 2: cp_standard · 0.51mm/px · 2 of 74 frames shown (1 of 6)]
[frame 38/74]
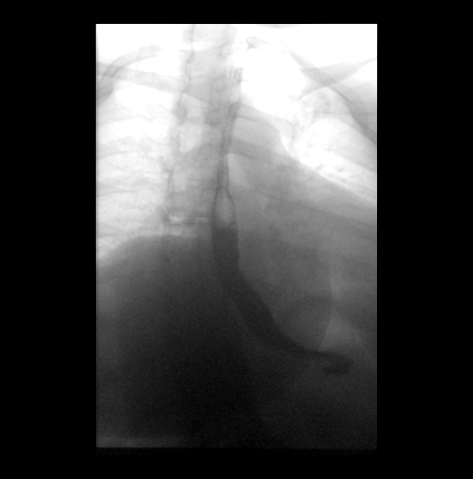
[frame 61/74]
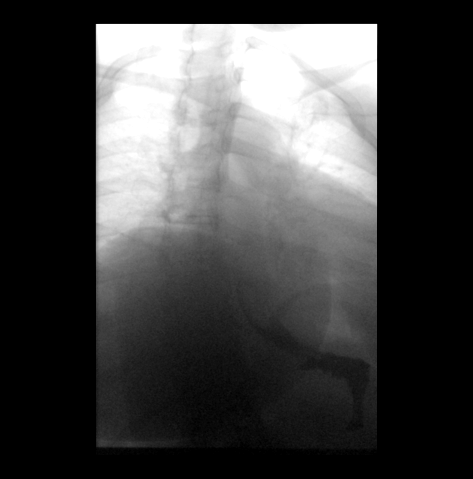

[Series 3: cp_standard · 0.51mm/px · 3 of 103 frames shown (2 of 6)]
[frame 12/103]
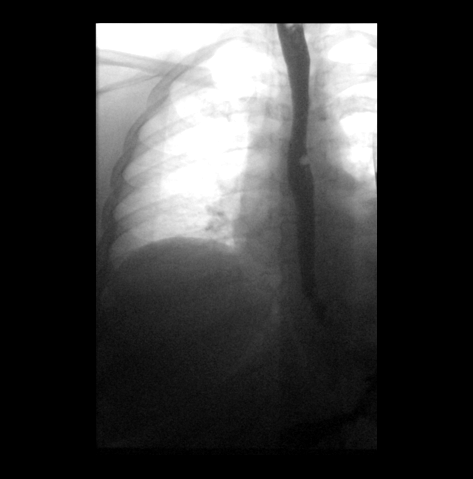
[frame 16/103]
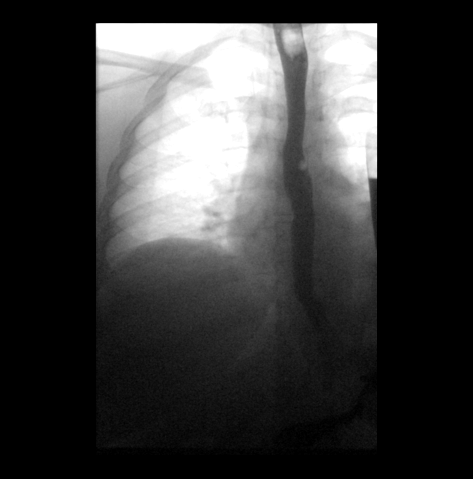
[frame 88/103]
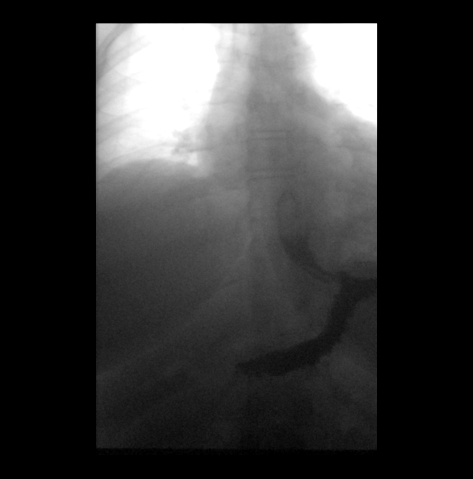

[Series 4: cp_standard · 0.51mm/px · 3 of 86 frames shown (3 of 6)]
[frame 4/86]
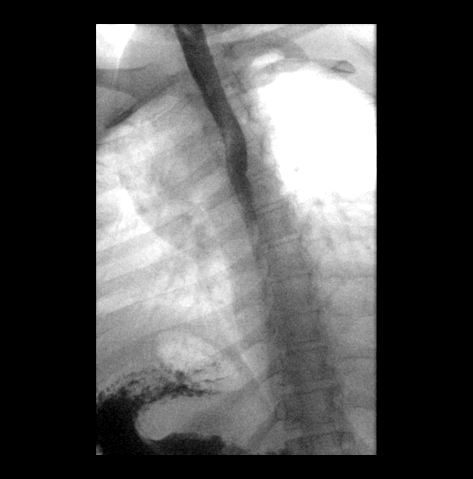
[frame 44/86]
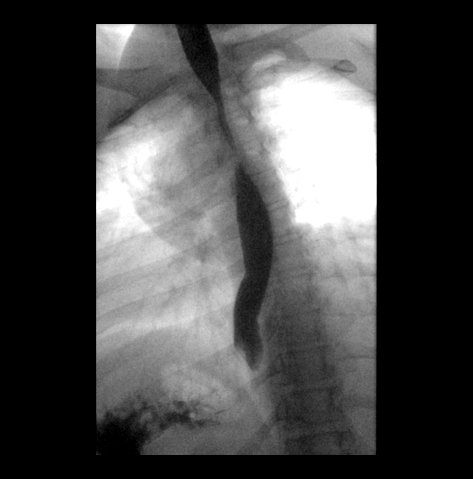
[frame 74/86]
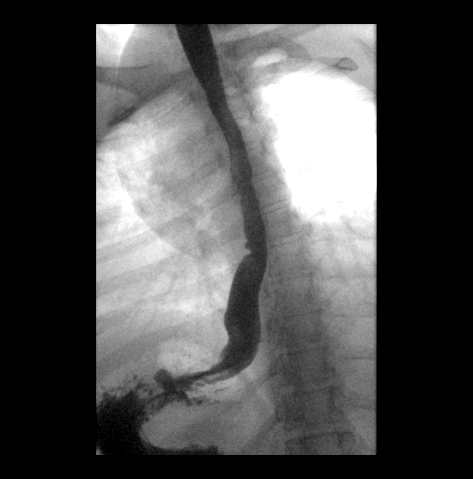

[Series 6: cp_standard · 0.25mm/px · 1 of 1 slices shown (4 of 6)]
[im 1/1]
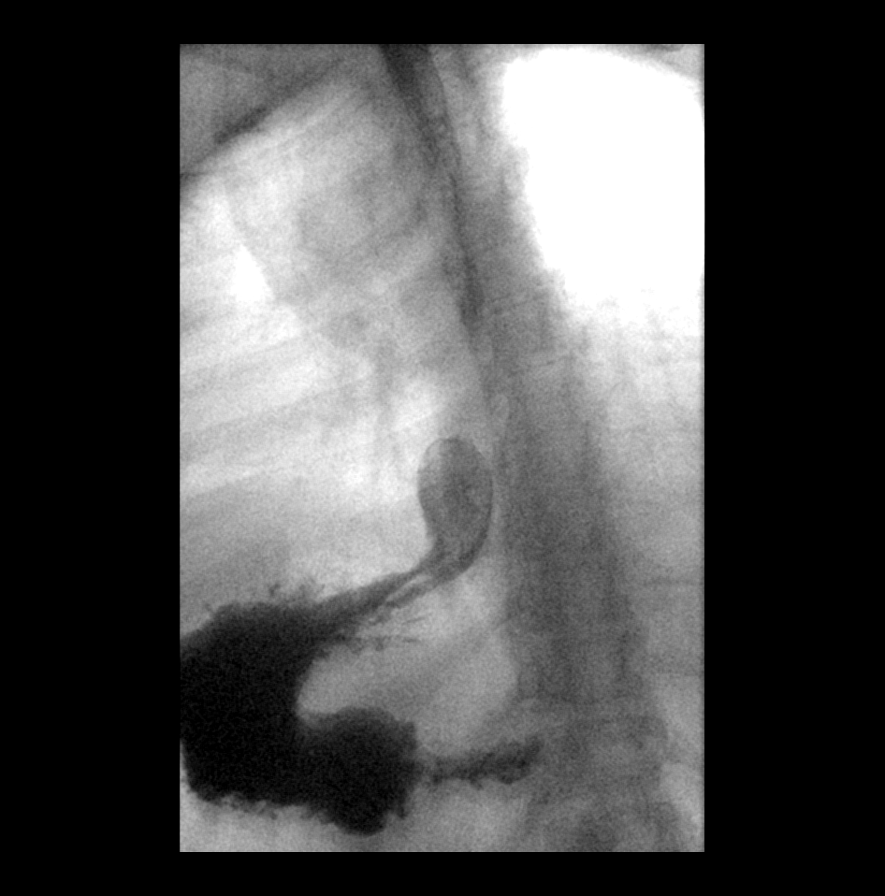

[Series 7: cp_standard · 0.17mm/px · 1 of 1 slices shown (5 of 6)]
[im 1/1]
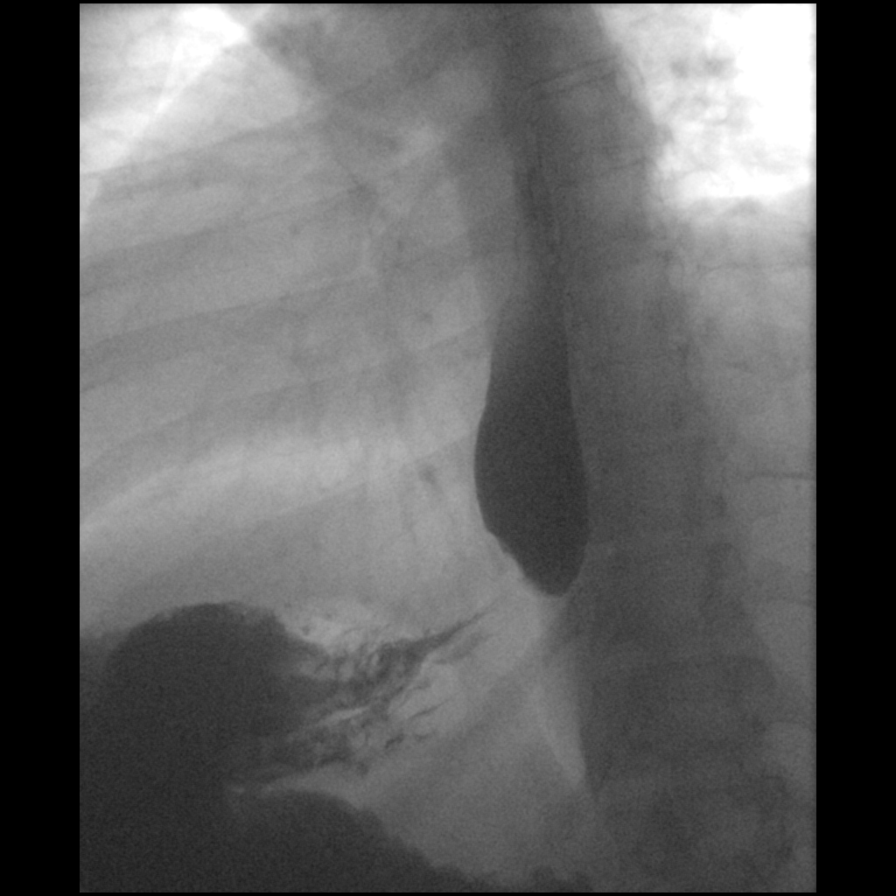

[Series 9: cp_standard · 0.17mm/px · 1 of 1 slices shown (6 of 6)]
[im 1/1]
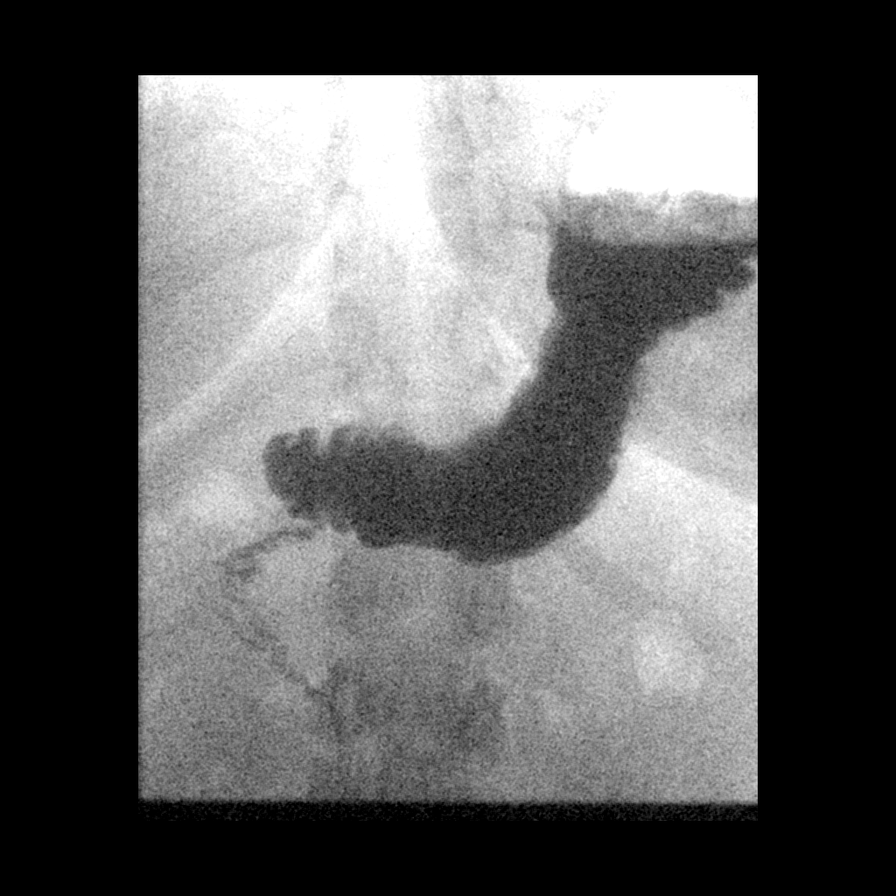

[12 of 18 positions shown; findings below may reference images not displayed]

FINDINGS: Normal esophageal motility. No intrinsic or extrinsic lesions
identified. There is a small sliding-type hiatal hernia but no GE
reflux was demonstrated.

The stomach, duodenum bulb and C-loop are normal. The duodenum
jejunal junction is in its normal anatomic location.
IMPRESSION: Normal study except for a small sliding-type hiatal hernia.

## 2018-04-10 MED FILL — BARIAT ADVANT ADVMULT 60CT: 30 days supply | Qty: 60 | Fill #3

## 2018-04-10 MED FILL — CELEBRATE CALCL CIT CHOC 90: 30 days supply | Qty: 90 | Fill #2

## 2018-04-12 ENCOUNTER — Other Ambulatory Visit: Payer: Self-pay | Admitting: Internal Medicine

## 2018-06-24 ENCOUNTER — Other Ambulatory Visit: Payer: Self-pay

## 2018-06-24 ENCOUNTER — Ambulatory Visit: Payer: BC Managed Care – PPO | Admitting: Internal Medicine

## 2018-06-24 ENCOUNTER — Encounter: Payer: Self-pay | Admitting: Internal Medicine

## 2018-06-24 VITALS — BP 114/80 | HR 91 | Temp 98.2°F | Ht 62.0 in | Wt 187.2 lb

## 2018-06-24 DIAGNOSIS — J301 Allergic rhinitis due to pollen: Secondary | ICD-10-CM

## 2018-06-24 DIAGNOSIS — E6609 Other obesity due to excess calories: Secondary | ICD-10-CM | POA: Diagnosis not present

## 2018-06-24 DIAGNOSIS — Z6834 Body mass index (BMI) 34.0-34.9, adult: Secondary | ICD-10-CM

## 2018-06-24 DIAGNOSIS — E119 Type 2 diabetes mellitus without complications: Secondary | ICD-10-CM | POA: Diagnosis not present

## 2018-06-24 DIAGNOSIS — Z9884 Bariatric surgery status: Secondary | ICD-10-CM

## 2018-06-24 LAB — POCT UA - MICROALBUMIN
Creatinine, POC: 300 mg/dL
Microalbumin Ur, POC: 80 mg/L

## 2018-06-24 MED ORDER — LEVOCETIRIZINE DIHYDROCHLORIDE 5 MG PO TABS
ORAL_TABLET | ORAL | 2 refills | Status: DC
Start: 2018-06-24 — End: 2019-03-22

## 2018-06-24 MED ORDER — CHLORPHEN-PE-ACETAMINOPHEN 4-10-325 MG PO TABS: 4.0000 mg | ORAL_TABLET | Freq: Two times a day (BID) | ORAL | 0 refills | Status: DC | PRN

## 2018-06-24 MED ORDER — MOMETASONE FUROATE 50 MCG/ACT NA SUSP: 1.0000 | Freq: Every day | NASAL | 3 refills | Status: DC

## 2018-06-24 NOTE — Progress Notes (Signed)
Subjective:     Patient ID: Sue Bell , female    DOB: 1971-04-10 , 48 y.o.   MRN: [id number]   Chief Complaint  Patient presents with  . Diabetes    HPI  She is here today for a diabetes check. She is no longer on medication. 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 She has no specific concerns at this time. She admits she is not exercising as much as she should.     Past Medical History:  Diagnosis Date  . Diabetes mellitus without complication ([place])   . Hyperlipidemia   . Hypertension   . OSA (obstructive sleep apnea) 07/08/2016     Family History  Problem Relation Age of Onset  . Heart disease Mother   . Diabetes Father   . Cancer Father   . Breast cancer Sister      Current Outpatient Medications:  .  Calcium-Magnesium-Vitamin D (CALCIUM 500 PO), Take by mouth., Disp: , Rfl:  .  cetirizine (ZYRTEC) 10 MG tablet, Take 10 mg by mouth daily as needed for allergies (takes Zyrtec during day if needed)., Disp: , Rfl:  .  levocetirizine (XYZAL) 5 MG tablet, TAKE 1 TABLET(5 MG) BY MOUTH EVERY DAY IN THE EVENING, Disp: 90 tablet, Rfl: 2 .  mometasone (NASONEX) 50 MCG/ACT nasal spray, Place 1 spray into the nose daily., Disp: 17 g, Rfl: 3 .  Multiple Vitamins-Minerals (BARIATRIC MULTIVITAMINS/IRON) CAPS, Take by mouth., Disp: , Rfl:  .  Chlorphen-PE-Acetaminophen (NOREL AD) 4-10-325 MG TABS, Take 4-10 mg by mouth 2 (two) times daily as needed., Disp: 20 tablet, Rfl: 0   Allergies  Allergen Reactions  . Sulfa Antibiotics Hives     Review of Systems  Constitutional: Negative.   HENT: Positive for postnasal drip.        Wants to have her ears checked. They feel clogged.   Respiratory: Negative.   Cardiovascular: Negative.   Gastrointestinal: Negative.   Neurological: Negative.   Psychiatric/Behavioral: Negative.      Today's Vitals   06/24/18 0920  BP: 114/80  Pulse: 91  Temp: 98.2 F (36.8 C)  TempSrc: Oral  Weight: 187  lb 3.2 oz (84.9 kg)  Height: '5\' 2"'  (1.575 m)   Body mass index is 34.24 kg/m.   Objective:  Physical Exam Vitals signs and nursing note reviewed.  Constitutional:      Appearance: Normal appearance.  HENT:     Head: Normocephalic and atraumatic.     Right Ear: Tympanic membrane, ear canal and external ear normal.     Left Ear: Tympanic membrane, ear canal and external ear normal.  Cardiovascular:     Rate and Rhythm: Normal rate and regular rhythm.     Heart sounds: Normal heart sounds.  Pulmonary:     Effort: Pulmonary effort is normal.     Breath sounds: Normal breath sounds.  Skin:    General: Skin is warm.  Neurological:     General: No focal deficit present.     Mental Status: She is alert.  Psychiatric:        Mood and Affect: Mood normal.        Behavior: Behavior normal.         Assessment And Plan:     1. Diabetes mellitus without complication ([place])  I will check labs as listed below. She is encouraged to incorporate more exercise into her daily routine. She is encouraged to aim for 30 minutes  five days weekly.   - Lipid panel - CMP14+EGFR - Hemoglobin A1c  2. Seasonal allergic rhinitis due to pollen  She was given refill of nasonex, one spray eacn nostril daily. Ear exam performed, no significant abnormalities noted.   3. Class 1 obesity due to excess calories without serious comorbidity with body mass index (BMI) of 34.0 to 34.9 in adult  Importance of achieving optimal weight to decrease risk of cardiovascular disease and cancers was discussed with the patient in full detail. She is encouraged to start slowly - start with 10 minutes twice daily at least three to four days per week and to gradually build to 30 minutes five days weekly. She was given tips to incorporate more activity into her daily routine - take stairs when possible, park farther away from her job, grocery stores, etc.   Maximino Greenland, MD

## 2018-06-24 NOTE — Patient Instructions (Signed)

## 2018-06-25 LAB — CMP14+EGFR
ALBUMIN: 4.5 g/dL (ref 3.8–4.8)
ALT: 17 IU/L (ref 0–32)
AST: 19 IU/L (ref 0–40)
Albumin/Globulin Ratio: 1.7 (ref 1.2–2.2)
Alkaline Phosphatase: 83 IU/L (ref 39–117)
BUN/Creatinine Ratio: 18 (ref 9–23)
BUN: 13 mg/dL (ref 6–24)
Bilirubin Total: 0.2 mg/dL (ref 0.0–1.2)
CALCIUM: 9.6 mg/dL (ref 8.7–10.2)
CHLORIDE: 104 mmol/L (ref 96–106)
CO2: 24 mmol/L (ref 20–29)
CREATININE: 0.74 mg/dL (ref 0.57–1.00)
GFR, EST AFRICAN AMERICAN: 111 mL/min/{1.73_m2} (ref >59)
GFR, EST NON AFRICAN AMERICAN: 96 mL/min/{1.73_m2} (ref >59)
GLOBULIN, TOTAL: 2.6 g/dL (ref 1.5–4.5)
GLUCOSE: 95 mg/dL (ref 65–99)
POTASSIUM: 4.1 mmol/L (ref 3.5–5.2)
Sodium: 147 mmol/L — ABNORMAL HIGH (ref 134–144)
TOTAL PROTEIN: 7.1 g/dL (ref 6.0–8.5)

## 2018-06-25 LAB — LIPID PANEL
Chol/HDL Ratio: 3.1 ratio (ref 0.0–4.4)
Cholesterol, Total: 237 mg/dL — ABNORMAL HIGH (ref 100–199)
HDL: 77 mg/dL (ref >39)
LDL Calculated: 147 mg/dL — ABNORMAL HIGH (ref 0–99)
Triglycerides: 65 mg/dL (ref 0–149)
VLDL CHOLESTEROL CAL: 13 mg/dL (ref 5–40)

## 2018-06-25 LAB — HEMOGLOBIN A1C
ESTIMATED AVERAGE GLUCOSE: 126 mg/dL
HEMOGLOBIN A1C: 6 % — AB (ref 4.8–5.6)

## 2018-07-03 MED FILL — CELEBRATE CALCL CIT CHOC 90: 30 days supply | Qty: 90 | Fill #3

## 2018-07-03 MED FILL — BARIAT ADVANT ADVMULT 60CT: 30 days supply | Qty: 60 | Fill #4

## 2018-08-26 ENCOUNTER — Other Ambulatory Visit: Payer: Self-pay | Admitting: Internal Medicine

## 2018-08-26 DIAGNOSIS — Z1231 Encounter for screening mammogram for malignant neoplasm of breast: Secondary | ICD-10-CM

## 2018-09-04 ENCOUNTER — Other Ambulatory Visit: Payer: Self-pay

## 2018-09-04 MED ORDER — BARIATRIC MULTIVITAMINS/IRON PO CAPS: 1.0000 | ORAL_CAPSULE | Freq: Every day | ORAL | 6 refills | Status: DC

## 2018-09-04 MED FILL — BARIAT ADVANT ADVMULT 60CT: 30 days supply | Qty: 60 | Fill #0

## 2018-09-09 ENCOUNTER — Other Ambulatory Visit: Payer: Self-pay

## 2018-10-19 ENCOUNTER — Other Ambulatory Visit: Payer: Self-pay

## 2018-10-19 ENCOUNTER — Ambulatory Visit
Admission: RE | Admit: 2018-10-19 | Discharge: 2018-10-19 | Disposition: A | Discharge status: Home / Self Care | Payer: BC Managed Care – PPO | Source: Ambulatory Visit | Attending: Internal Medicine | Admitting: Internal Medicine

## 2018-10-19 DIAGNOSIS — Z1231 Encounter for screening mammogram for malignant neoplasm of breast: Secondary | ICD-10-CM

## 2018-10-20 ENCOUNTER — Other Ambulatory Visit: Payer: Self-pay | Admitting: Internal Medicine

## 2018-10-20 DIAGNOSIS — R928 Other abnormal and inconclusive findings on diagnostic imaging of breast: Secondary | ICD-10-CM

## 2018-10-22 ENCOUNTER — Ambulatory Visit
Admission: RE | Admit: 2018-10-22 | Discharge: 2018-10-22 | Disposition: A | Discharge status: Home / Self Care | Payer: BC Managed Care – PPO | Source: Ambulatory Visit | Attending: Internal Medicine | Admitting: Internal Medicine

## 2018-10-22 DIAGNOSIS — R928 Other abnormal and inconclusive findings on diagnostic imaging of breast: Secondary | ICD-10-CM

## 2018-11-03 MED FILL — BARIAT ADVANT ADVMULT 60CT: 30 days supply | Qty: 60 | Fill #1

## 2018-11-27 MED FILL — BARIAT ADVANT ADVMULT 60CT: 30 days supply | Qty: 60 | Fill #1

## 2019-01-05 ENCOUNTER — Encounter: Payer: Self-pay | Admitting: Internal Medicine

## 2019-01-05 ENCOUNTER — Other Ambulatory Visit: Payer: Self-pay

## 2019-01-05 ENCOUNTER — Ambulatory Visit: Payer: BC Managed Care – PPO | Admitting: Internal Medicine

## 2019-01-05 VITALS — BP 182/88 | HR 87 | Temp 98.4°F | Ht 62.6 in | Wt 193.0 lb

## 2019-01-05 DIAGNOSIS — I10 Essential (primary) hypertension: Secondary | ICD-10-CM | POA: Diagnosis not present

## 2019-01-05 DIAGNOSIS — F5101 Primary insomnia: Secondary | ICD-10-CM | POA: Diagnosis not present

## 2019-01-05 DIAGNOSIS — Z0000 Encounter for general adult medical examination without abnormal findings: Secondary | ICD-10-CM | POA: Diagnosis not present

## 2019-01-05 DIAGNOSIS — Z23 Encounter for immunization: Secondary | ICD-10-CM

## 2019-01-05 DIAGNOSIS — E119 Type 2 diabetes mellitus without complications: Secondary | ICD-10-CM

## 2019-01-05 DIAGNOSIS — Z9884 Bariatric surgery status: Secondary | ICD-10-CM | POA: Diagnosis not present

## 2019-01-05 LAB — POCT URINALYSIS DIPSTICK
Bilirubin, UA: NEGATIVE
Glucose, UA: NEGATIVE
Ketones, UA: NEGATIVE
Leukocytes, UA: NEGATIVE
Nitrite, UA: NEGATIVE
Protein, UA: NEGATIVE
Spec Grav, UA: >=1.03 — AB (ref 1.010–1.025)
Urobilinogen, UA: 0.2 E.U./dL
pH, UA: 5.5 (ref 5.0–8.0)

## 2019-01-05 LAB — POCT UA - MICROALBUMIN
Albumin/Creatinine Ratio, Urine, POC: <30
Creatinine, POC: 300 mg/dL
Microalbumin Ur, POC: 30 mg/L

## 2019-01-05 MED ORDER — NOREL AD 4-10-325 MG PO TABS: 4.0000 mg | ORAL_TABLET | Freq: Two times a day (BID) | ORAL | 0 refills | Status: DC | PRN

## 2019-01-05 MED ORDER — VALSARTAN 160 MG PO TABS: 160.0000 mg | ORAL_TABLET | Freq: Every day | ORAL | 1 refills | Status: DC

## 2019-01-05 NOTE — Patient Instructions (Signed)
Health Maintenance, Female Adopting a healthy lifestyle and getting preventive care are important in promoting health and wellness. Ask your health care provider about:  The right schedule for you to have regular tests and exams.  Things you can do on your own to prevent diseases and keep yourself healthy. What should I know about diet, weight, and exercise? Eat a healthy diet   Eat a diet that includes plenty of vegetables, fruits, low-fat dairy products, and lean protein.  Do not eat a lot of foods that are high in solid fats, added sugars, or sodium. Maintain a healthy weight Body mass index (BMI) is used to identify weight problems. It estimates body fat based on height and weight. Your health care provider can help determine your BMI and help you achieve or maintain a healthy weight. Get regular exercise Get regular exercise. This is one of the most important things you can do for your health. Most adults should:  Exercise for at least 150 minutes each week. The exercise should increase your heart rate and make you sweat (moderate-intensity exercise).  Do strengthening exercises at least twice a week. This is in addition to the moderate-intensity exercise.  Spend less time sitting. Even light physical activity can be beneficial. Watch cholesterol and blood lipids Have your blood tested for lipids and cholesterol at 49 years of age, then have this test every 5 years. Have your cholesterol levels checked more often if:  Your lipid or cholesterol levels are high.  You are older than 49 years of age.  You are at high risk for heart disease. What should I know about cancer screening? Depending on your health history and family history, you may need to have cancer screening at various ages. This may include screening for:  Breast cancer.  Cervical cancer.  Colorectal cancer.  Skin cancer.  Lung cancer. What should I know about heart disease, diabetes, and high blood  pressure? Blood pressure and heart disease  High blood pressure causes heart disease and increases the risk of stroke. This is more likely to develop in people who have high blood pressure readings, are of African descent, or are overweight.  Have your blood pressure checked: ? Every 3-5 years if you are 18-39 years of age. ? Every year if you are 40 years old or older. Diabetes Have regular diabetes screenings. This checks your fasting blood sugar level. Have the screening done:  Once every three years after age 40 if you are at a normal weight and have a low risk for diabetes.  More often and at a younger age if you are overweight or have a high risk for diabetes. What should I know about preventing infection? Hepatitis B If you have a higher risk for hepatitis B, you should be screened for this virus. Talk with your health care provider to find out if you are at risk for hepatitis B infection. Hepatitis C Testing is recommended for:  Everyone born from 1945 through 1965.  Anyone with known risk factors for hepatitis C. Sexually transmitted infections (STIs)  Get screened for STIs, including gonorrhea and chlamydia, if: ? You are sexually active and are younger than 49 years of age. ? You are older than 49 years of age and your health care provider tells you that you are at risk for this type of infection. ? Your sexual activity has changed since you were last screened, and you are at increased risk for chlamydia or gonorrhea. Ask your health care provider if   you are at risk.  Ask your health care provider about whether you are at high risk for HIV. Your health care provider may recommend a prescription medicine to help prevent HIV infection. If you choose to take medicine to prevent HIV, you should first get tested for HIV. You should then be tested every 3 months for as long as you are taking the medicine. Pregnancy  If you are about to stop having your period (premenopausal) and  you may become pregnant, seek counseling before you get pregnant.  Take 400 to 800 micrograms (mcg) of folic acid every day if you become pregnant.  Ask for birth control (contraception) if you want to prevent pregnancy. Osteoporosis and menopause Osteoporosis is a disease in which the bones lose minerals and strength with aging. This can result in bone fractures. If you are 65 years old or older, or if you are at risk for osteoporosis and fractures, ask your health care provider if you should:  Be screened for bone loss.  Take a calcium or vitamin D supplement to lower your risk of fractures.  Be given hormone replacement therapy (HRT) to treat symptoms of menopause. Follow these instructions at home: Lifestyle  Do not use any products that contain nicotine or tobacco, such as cigarettes, e-cigarettes, and chewing tobacco. If you need help quitting, ask your health care provider.  Do not use street drugs.  Do not share needles.  Ask your health care provider for help if you need support or information about quitting drugs. Alcohol use  Do not drink alcohol if: ? Your health care provider tells you not to drink. ? You are pregnant, may be pregnant, or are planning to become pregnant.  If you drink alcohol: ? Limit how much you use to 0-1 drink a day. ? Limit intake if you are breastfeeding.  Be aware of how much alcohol is in your drink. In the [place] one drink equals one 12 oz bottle of beer (355 mL), one 5 oz glass of wine (148 mL), or one 1 oz glass of hard liquor (44 mL). General instructions  Schedule regular health, dental, and eye exams.  Stay current with your vaccines.  Tell your health care provider if: ? You often feel depressed. ? You have ever been abused or do not feel safe at home. Summary  Adopting a healthy lifestyle and getting preventive care are important in promoting health and wellness.  Follow your health care provider's instructions about healthy  diet, exercising, and getting tested or screened for diseases.  Follow your health care provider's instructions on monitoring your cholesterol and blood pressure. This information is not intended to replace advice given to you by your health care provider. Make sure you discuss any questions you have with your health care provider. Document Released: 10/15/2010 Document Revised: 03/25/2018 Document Reviewed: 03/25/2018 Elsevier Patient Education  2020 Else[employer] Inc[employer]  

## 2019-01-05 NOTE — Progress Notes (Signed)
Subjective:     Patient ID: Sue Bell , female    DOB: [birth year] , 48 y.o.   MRN: [id number]   Chief Complaint  Patient presents with  . Annual Exam  . Diabetes  . Hypertension    HPI  She is here today for a full physical examination. She is followed by GYN for her pelvic exams. She is no longer on medication for dm or htn. 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   Diabetes She presents for her follow-up diabetic visit. She has type 2 diabetes mellitus. There are no hypoglycemic associated symptoms. There are no diabetic associated symptoms. Pertinent negatives for diabetes include no blurred vision and no chest pain. There are no hypoglycemic complications. Risk factors for coronary artery disease include diabetes mellitus, obesity and sedentary lifestyle. She is compliant with treatment most of the time. She participates in exercise intermittently. An ACE inhibitor/angiotensin II receptor blocker is not being taken.  Hypertension This is a chronic problem. The current episode started more than 1 year ago. The problem has been gradually improving since onset. The problem is controlled. Pertinent negatives include no blurred vision, chest pain, palpitations or shortness of breath. Past treatments include angiotensin blockers. The current treatment provides moderate improvement. Compliance problems include exercise.      Past Medical History:  Diagnosis Date  . Diabetes mellitus without complication ([place])   . Hyperlipidemia   . Hypertension   . OSA (obstructive sleep apnea) 07/08/2016     Family History  Problem Relation Age of Onset  . Heart disease Mother   . Diabetes Father   . Cancer Father   . Breast cancer Sister      Current Outpatient Medications:  .  Calcium-Magnesium-Vitamin D (CALCIUM 500 PO), Take by mouth., Disp: , Rfl:  .  cetirizine (ZYRTEC) 10 MG tablet, Take 10 mg by mouth daily as needed for allergies (takes Zyrtec  during day if needed)., Disp: , Rfl:  .  Chlorphen-PE-Acetaminophen (NOREL AD) 4-10-325 MG TABS, Take 4-10 mg by mouth 2 (two) times daily as needed., Disp: 20 tablet, Rfl: 0 .  levocetirizine (XYZAL) 5 MG tablet, TAKE 1 TABLET(5 MG) BY MOUTH EVERY DAY IN THE EVENING, Disp: 90 tablet, Rfl: 2 .  mometasone (NASONEX) 50 MCG/ACT nasal spray, Place 1 spray into the nose daily., Disp: 17 g, Rfl: 3 .  valsartan (DIOVAN) 160 MG tablet, Take 1 tablet (160 mg total) by mouth daily., Disp: 90 tablet, Rfl: 1   Allergies  Allergen Reactions  . Sulfa Antibiotics Hives     The patient states she uses status post hysterectomy for birth control. Last LMP was No LMP recorded. Patient has had a hysterectomy.. Negative for Dysmenorrhea  Negative for: breast discharge, breast lump(s), breast pain and breast self exam. Associated symptoms include abnormal vaginal bleeding. Pertinent negatives include abnormal bleeding (hematology), anxiety, decreased libido, depression, difficulty falling sleep, dyspareunia, history of infertility, nocturia, sexual dysfunction, sleep disturbances, urinary incontinence, urinary urgency, vaginal discharge and vaginal itching. Diet regular.The patient states her exercise level is  intermittent.  . The patient's tobacco use is:  Social History   Tobacco Use  Smoking Status Never Smoker  Smokeless Tobacco Never Used  . She has been exposed to passive smoke. The patient's alcohol use is:  Social History   Substance and Sexual Activity  Alcohol Use No    Review of Systems  Constitutional: Negative.   HENT: Negative.  Eyes: Negative.  Negative for blurred vision.  Respiratory: Negative.  Negative for shortness of breath.   Cardiovascular: Negative.  Negative for chest pain and palpitations.  Gastrointestinal: Negative.   Endocrine: Negative.   Genitourinary: Negative.   Musculoskeletal: Negative.   Skin: Negative.   Allergic/Immunologic: Negative.   Neurological: Negative.    Hematological: Negative.   Psychiatric/Behavioral: Positive for sleep disturbance.       She reports having difficulty falling and staying asleep for the past several weeks. She does not feel stressed. She is not sure what is contributing to her symptoms. She denies change in eating habits. No new stressors.      Today's Vitals   01/05/19 0924  BP: (!) 182/88  Pulse: 87  Temp: 98.4 F (36.9 C)  TempSrc: Oral  SpO2: 98%  Weight: 193 lb (87.5 kg)  Height: 5' 2.6" (1.59 m)   Body mass index is 34.63 kg/m.   Objective:  Physical Exam Vitals signs and nursing note reviewed.  Constitutional:      Appearance: Normal appearance. She is obese.  HENT:     Head: Normocephalic and atraumatic.     Right Ear: Tympanic membrane, ear canal and external ear normal.     Left Ear: Tympanic membrane, ear canal and external ear normal.     Nose: Nose normal.     Mouth/Throat:     Mouth: Mucous membranes are moist.     Pharynx: Oropharynx is clear.  Eyes:     Extraocular Movements: Extraocular movements intact.     Conjunctiva/sclera: Conjunctivae normal.     Pupils: Pupils are equal, round, and reactive to light.  Neck:     Musculoskeletal: Normal range of motion and neck supple.  Cardiovascular:     Rate and Rhythm: Normal rate and regular rhythm.     Pulses: Normal pulses.          Dorsalis pedis pulses are 2+ on the right side and 2+ on the left side.     Heart sounds: Normal heart sounds.  Pulmonary:     Effort: Pulmonary effort is normal.     Breath sounds: Normal breath sounds.  Chest:     Breasts: Tanner Score is 5.        Right: Normal. No swelling, bleeding, inverted nipple, mass, nipple discharge or skin change.        Left: Normal. No swelling, bleeding, inverted nipple, mass, nipple discharge or skin change.  Abdominal:     General: Abdomen is flat. Bowel sounds are normal.     Palpations: Abdomen is soft.     Comments: Obese.  Genitourinary:    Comments:  deferred Musculoskeletal: Normal range of motion.  Feet:     Right foot:     Protective Sensation: 5 sites tested. 5 sites sensed.     Skin integrity: Skin integrity normal.     Toenail Condition: Right toenails are normal.     Left foot:     Protective Sensation: 5 sites tested. 5 sites sensed.     Skin integrity: Skin integrity normal.     Toenail Condition: Left toenails are normal.  Skin:    General: Skin is warm and dry.  Neurological:     General: No focal deficit present.     Mental Status: She is alert and oriented to person, place, and time.  Psychiatric:        Mood and Affect: Mood normal.        Behavior: Behavior normal.  Assessment And Plan:     1. Encounter for physical examination  A full exam was performed.  Importance of monthly self breast exams was performed.  PATIENT HAS BEEN ADVISED TO GET 30-45 MINUTES REGULAR EXERCISE NO LESS THAN FOUR TO FIVE DAYS PER WEEK - BOTH WEIGHTBEARING EXERCISES AND AEROBIC ARE RECOMMENDED.  SHE WAS ADVISED TO FOLLOW A HEALTHY DIET WITH AT LEAST SIX FRUITS/VEGGIES PER DAY, DECREASE INTAKE OF RED MEAT, AND TO INCREASE FISH INTAKE TO TWO DAYS PER WEEK.  MEATS/FISH SHOULD NOT BE FRIED, BAKED OR BROILED IS PREFERABLE.  I SUGGEST WEARING SPF 50 SUNSCREEN ON EXPOSED PARTS AND ESPECIALLY WHEN IN THE DIRECT SUNLIGHT FOR AN EXTENDED PERIOD OF TIME.  PLEASE AVOID FAST FOOD RESTAURANTS AND INCREASE YOUR WATER INTAKE.  - POCT Urinalysis Dipstick (81002) - POCT UA - Microalbumin - EKG 12-Lead - CMP14+EGFR - CBC - Lipid panel - Hemoglobin A1c - FSH - TSH  2. Diabetes mellitus without complication ([place])  Diabetic foot exam was performed.  She has done well without rx medications. I DISCUSSED WITH THE PATIENT AT LENGTH REGARDING THE GOALS OF GLYCEMIC CONTROL AND POSSIBLE LONG-TERM COMPLICATIONS.  I  ALSO STRESSED THE IMPORTANCE OF COMPLIANCE WITH HOME GLUCOSE MONITORING, DIETARY RESTRICTIONS INCLUDING AVOIDANCE OF SUGARY  DRINKS/PROCESSED FOODS,  ALONG WITH REGULAR EXERCISE.  I  ALSO STRESSED THE IMPORTANCE OF ANNUAL EYE EXAMS, SELF FOOT CARE AND COMPLIANCE WITH OFFICE VISITS.  - POCT Urinalysis Dipstick (81002) - POCT UA - Microalbumin - EKG 12-Lead  3. Essential hypertension, benign  Chronic, uncontrolled. She had previously been weaned off of meds after her weight loss surgery. She is encouraged to avoid adding salt to her foods. Also encouraged to avoid packaged and processed foods. EKg performed, no new changes noted. She reluctantly agrees to resume valsartan. She will rto in four weeks for re-evaluation. I will check a bmp at that time.   4. Primary insomnia  Possibly related to hormonal changes, perimenopause. She was given rx Dayvigo, 54m to use nightly as needed. Importance of good bedtime hygiene was discussed with the patient.   5. Need for influenza vaccination  - Flu Vaccine QUAD 6+ mos PF IM (Fluarix Quad PF)      RMaximino Greenland MD    THE PATIENT IS ENCOURAGED TO PRACTICE SOCIAL DISTANCING DUE TO THE COVID-19 PANDEMIC.

## 2019-01-06 ENCOUNTER — Other Ambulatory Visit: Payer: Self-pay | Admitting: Internal Medicine

## 2019-01-06 LAB — CMP14+EGFR
ALT: 12 IU/L (ref 0–32)
AST: 16 IU/L (ref 0–40)
Albumin/Globulin Ratio: 1.7 (ref 1.2–2.2)
Albumin: 4.3 g/dL (ref 3.8–4.8)
Alkaline Phosphatase: 84 IU/L (ref 39–117)
BUN/Creatinine Ratio: 16 (ref 9–23)
BUN: 12 mg/dL (ref 6–24)
Bilirubin Total: 0.3 mg/dL (ref 0.0–1.2)
CO2: 25 mmol/L (ref 20–29)
Calcium: 9.3 mg/dL (ref 8.7–10.2)
Chloride: 104 mmol/L (ref 96–106)
Creatinine, Ser: 0.76 mg/dL (ref 0.57–1.00)
GFR calc Af Amer: 107 mL/min/{1.73_m2} (ref >59)
GFR calc non Af Amer: 92 mL/min/{1.73_m2} (ref >59)
Globulin, Total: 2.6 g/dL (ref 1.5–4.5)
Glucose: 87 mg/dL (ref 65–99)
Potassium: 4.1 mmol/L (ref 3.5–5.2)
Sodium: 141 mmol/L (ref 134–144)
Total Protein: 6.9 g/dL (ref 6.0–8.5)

## 2019-01-06 LAB — FOLLICLE STIMULATING HORMONE: FSH: 59.1 m[IU]/mL

## 2019-01-06 LAB — LIPID PANEL
Chol/HDL Ratio: 3.1 ratio (ref 0.0–4.4)
Cholesterol, Total: 244 mg/dL — ABNORMAL HIGH (ref 100–199)
HDL: 78 mg/dL (ref >39)
LDL Chol Calc (NIH): 154 mg/dL — ABNORMAL HIGH (ref 0–99)
Triglycerides: 70 mg/dL (ref 0–149)
VLDL Cholesterol Cal: 12 mg/dL (ref 5–40)

## 2019-01-06 LAB — CBC
Hematocrit: 39.8 % (ref 34.0–46.6)
Hemoglobin: 12.3 g/dL (ref 11.1–15.9)
MCH: 26.3 pg — ABNORMAL LOW (ref 26.6–33.0)
MCHC: 30.9 g/dL — ABNORMAL LOW (ref 31.5–35.7)
MCV: 85 fL (ref 79–97)
Platelets: 367 10*3/uL (ref 150–450)
RBC: 4.68 x10E6/uL (ref 3.77–5.28)
RDW: 13.2 % (ref 11.7–15.4)
WBC: 5.4 10*3/uL (ref 3.4–10.8)

## 2019-01-06 LAB — HEMOGLOBIN A1C
Est. average glucose Bld gHb Est-mCnc: 117 mg/dL
Hgb A1c MFr Bld: 5.7 % — ABNORMAL HIGH (ref 4.8–5.6)

## 2019-01-06 LAB — TSH: TSH: 1.43 u[IU]/mL (ref 0.450–4.500)

## 2019-01-06 MED ORDER — DAYVIGO 5 MG PO TABS: 5.0000 mg | ORAL_TABLET | Freq: Every evening | ORAL | 0 refills | Status: DC | PRN

## 2019-01-25 ENCOUNTER — Other Ambulatory Visit: Payer: Self-pay

## 2019-01-25 ENCOUNTER — Encounter: Payer: Self-pay | Admitting: Nurse Practitioner

## 2019-01-25 ENCOUNTER — Telehealth (INDEPENDENT_AMBULATORY_CARE_PROVIDER_SITE_OTHER): Payer: BC Managed Care – PPO | Admitting: Nurse Practitioner

## 2019-01-25 ENCOUNTER — Encounter: Payer: Self-pay | Admitting: Internal Medicine

## 2019-01-25 ENCOUNTER — Telehealth: Payer: Self-pay

## 2019-01-25 VITALS — Temp 99.1°F | Wt 193.0 lb

## 2019-01-25 DIAGNOSIS — R509 Fever, unspecified: Secondary | ICD-10-CM

## 2019-01-25 DIAGNOSIS — J3489 Other specified disorders of nose and nasal sinuses: Secondary | ICD-10-CM

## 2019-01-25 DIAGNOSIS — J029 Acute pharyngitis, unspecified: Secondary | ICD-10-CM | POA: Diagnosis not present

## 2019-01-25 DIAGNOSIS — Z20828 Contact with and (suspected) exposure to other viral communicable diseases: Secondary | ICD-10-CM

## 2019-01-25 MED ORDER — AMOXICILLIN-POT CLAVULANATE 875-125 MG PO TABS: 1.0000 | ORAL_TABLET | Freq: Two times a day (BID) | ORAL | 0 refills | Status: AC

## 2019-01-25 NOTE — Telephone Encounter (Signed)
The pt called for an appointment for a sinus infection.  I asked the pt did she have a fever or a cough and the pt said that she had a fever last night.  The pt was asked does she agree to a have a virtual appointment and the pt said yes.  The appt was scheduled.

## 2019-01-25 NOTE — Progress Notes (Signed)
Virtual Visit via Video   This visit type was conducted due to national recommendations for restrictions regarding the COVID-19 Pandemic (e.g. social distancing) in an effort to limit this patient's exposure and mitigate transmission in our community.  Due to her co-morbid illnesses, this patient is at least at moderate risk for complications without adequate follow up.  This format is felt to be most appropriate for this patient at this time.  All issues noted in this document were discussed and addressed.  A limited physical exam was performed with this format.    This visit type was conducted due to national recommendations for restrictions regarding the COVID-19 Pandemic (e.g. social distancing) in an effort to limit this patient's exposure and mitigate transmission in our community.  Patients identity confirmed using two different identifiers.  This format is felt to be most appropriate for this patient at this time.  All issues noted in this document were discussed and addressed.  No physical exam was performed (except for noted visual exam findings with Video Visits).    Date:  01/25/2019   ID:  Sue Bell, DOB 26-Sep-1970, MRN [id number]  Patient Location:  Home - spoke with Sue Bell  Provider location:   Office    Chief Complaint:  "I feel like I have a sinus infection"  History of Present Illness:    Sue Bell is a 48 y.o. female who presents via video conferencing for a telehealth visit today.    The patient does have symptoms concerning for COVID-19 infection (fever, chills, cough, or new shortness of breath).   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  She was in [place], [place].    Sinus Problem This is a new problem. The current episode started in the past 7 days (Saturday evening worse over the last 2 days). Associated symptoms include sinus pressure and a sore throat. Pertinent negatives include no chills, congestion, coughing,  headaches or shortness of breath. (Rhinorrhea a few days) Past treatments include acetaminophen.     Past Medical History:  Diagnosis Date  . Diabetes mellitus without complication ([facility])   . Hyperlipidemia   . Hypertension   . OSA (obstructive sleep apnea) 07/08/2016   Past Surgical History:  Procedure Laterality Date  . BREAST REDUCTION SURGERY  2003  . CESAREAN SECTION    . 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 PARTIAL HYSTERECTOMY  2005  . REDUCTION MAMMAPLASTY Bilateral 2003     Current Meds  Medication Sig  . Calcium-Magnesium-Vitamin D (CALCIUM 500 PO) Take by mouth.  . cetirizine (ZYRTEC) 10 MG tablet Take 10 mg by mouth daily as needed for allergies (takes Zyrtec during day if needed).  . Chlorphen-PE-Acetaminophen (NOREL AD) 4-10-325 MG TABS Take 4-10 mg by mouth 2 (two) times daily as needed.  . Lemborexant (DAYVIGO) 5 MG TABS Take 5 mg by mouth at bedtime as needed.  Marland Kitchen levocetirizine (XYZAL) 5 MG tablet TAKE 1 TABLET(5 MG) BY MOUTH EVERY DAY IN THE EVENING  . mometasone (NASONEX) 50 MCG/ACT nasal spray Place 1 spray into the nose daily.  . valsartan (DIOVAN) 160 MG tablet Take 1 tablet (160 mg total) by mouth daily.     Allergies:   Sulfa antibiotics   Social History   Tobacco Use  . Smoking status: Never  Smoker  . Smokeless tobacco: Never Used  Substance Use Topics  . Alcohol use: No  . Drug use: No     Family Hx: The patient's family history includes Breast cancer in her sister; Cancer in her father; Diabetes in her father; Heart disease in her mother.  ROS:   Please see the history of present illness.    Review of Systems  Constitutional: Negative for chills.  HENT: Positive for sinus pressure and sore throat. Negative for congestion.   Respiratory: Negative for cough and shortness of breath.    Cardiovascular: Negative.   Neurological: Negative for dizziness and headaches.  Psychiatric/Behavioral: Negative.     All other systems reviewed and are negative.   Labs/Other Tests and Data Reviewed:    Recent Labs: 01/05/2019: ALT 12; BUN 12; Creatinine, Ser 0.76; Hemoglobin 12.3; Platelets 367; Potassium 4.1; Sodium 141; TSH 1.430   Recent Lipid Panel Lab Results  Component Value Date/Time   CHOL 244 (H) 01/05/2019 10:24 AM   TRIG 70 01/05/2019 10:24 AM   HDL 78 01/05/2019 10:24 AM   CHOLHDL 3.1 01/05/2019 10:24 AM   LDLCALC 154 (H) 01/05/2019 10:24 AM    Wt Readings from Last 3 Encounters:  01/25/19 193 lb (87.5 kg)  01/05/19 193 lb (87.5 kg)  06/24/18 187 lb 3.2 oz (84.9 kg)     Exam:    Vital Signs:  Temp 99.1 F (37.3 C) (Oral)   Wt 193 lb (87.5 kg)   BMI 34.63 kg/m     Physical Exam  Constitutional: She is oriented to person, place, and time and well-developed, well-nourished, and in no distress. No distress.  Pulmonary/Chest: Effort normal. No respiratory distress.  Neurological: She is alert and oriented to person, place, and time.  Psychiatric: Mood, memory, affect and judgment normal.    ASSESSMENT & PLAN:    1. Sinus pressure  Will have her to be tested for coronavirus  Treating with augmentin as well due to previous history of sinus infections and has sinus pressure - Novel Coronavirus, NAA ([facility]) - amoxicillin-clavulanate (AUGMENTIN) 875-125 MG tablet; Take 1 tablet by mouth 2 (two) times daily for 7 days.  Dispense: 14 tablet; Refill: 0  2. Sore throat  Encouraged warm salt water gargles as needed  3. Fever and chills  Will check for coronavirus  Tylenol as needed for fever    COVID-19 Education: The signs and symptoms of COVID-19 were discussed with the patient and how to seek care for testing (follow up with PCP or arrange E-visit).  The importance of social distancing was discussed today.  Patient Risk:   After full review of  this patients clinical status, I feel that they are at least moderate risk at this time.  Time:   Today, I have spent 13 minutes/ seconds with the patient with telehealth technology discussing above diagnoses.     Medication Adjustments/Labs and Tests Ordered: Current medicines are reviewed at length with the patient today.  Concerns regarding medicines are outlined above.   Tests Ordered: No orders of the defined types were placed in this encounter.   Medication Changes: No orders of the defined types were placed in this encounter.   Disposition:  Follow up prn  Signed, Minette Brine, FNP

## 2019-01-26 ENCOUNTER — Other Ambulatory Visit: Payer: Self-pay

## 2019-01-26 DIAGNOSIS — Z20822 Contact with and (suspected) exposure to covid-19: Secondary | ICD-10-CM

## 2019-01-28 LAB — NOVEL CORONAVIRUS, NAA: SARS-CoV-2, NAA: NOT DETECTED

## 2019-02-02 ENCOUNTER — Ambulatory Visit: Payer: BC Managed Care – PPO | Admitting: Internal Medicine

## 2019-02-11 ENCOUNTER — Other Ambulatory Visit: Payer: Self-pay

## 2019-02-11 ENCOUNTER — Encounter: Payer: Self-pay | Admitting: Internal Medicine

## 2019-02-11 ENCOUNTER — Ambulatory Visit: Payer: BC Managed Care – PPO | Admitting: Internal Medicine

## 2019-02-11 VITALS — BP 120/86 | HR 86 | Temp 98.2°F | Ht 62.6 in | Wt 195.2 lb

## 2019-02-11 DIAGNOSIS — I10 Essential (primary) hypertension: Secondary | ICD-10-CM | POA: Diagnosis not present

## 2019-02-11 DIAGNOSIS — Z6835 Body mass index (BMI) 35.0-35.9, adult: Secondary | ICD-10-CM

## 2019-02-11 DIAGNOSIS — Z23 Encounter for immunization: Secondary | ICD-10-CM

## 2019-02-11 DIAGNOSIS — Z79899 Other long term (current) drug therapy: Secondary | ICD-10-CM | POA: Diagnosis not present

## 2019-02-11 NOTE — Patient Instructions (Signed)

## 2019-02-11 NOTE — Progress Notes (Signed)
Subjective:     Patient ID: Sue Bell , female    DOB: 1970-08-08 , 48 y.o.   MRN: [id number]   Chief Complaint  Patient presents with  . Hypertension    HPI  She was restarted on valsartan 159m daily at her last visit. She has tolerated the medication without any issues.  She wants to know if she can stop the medication since her bp has improved. She admits she is not yet exercising on a regular basis.     Past Medical History:  Diagnosis Date  . Diabetes mellitus without complication ([place]   . Hyperlipidemia   . Hypertension   . OSA (obstructive sleep apnea) 07/08/2016     Family History  Problem Relation Age of Onset  . Heart disease Mother   . Diabetes Father   . Cancer Father   . Breast cancer Sister      Current Outpatient Medications:  .  Calcium-Magnesium-Vitamin D (CALCIUM 500 PO), Take by mouth., Disp: , Rfl:  .  Chlorphen-PE-Acetaminophen (NOREL AD) 4-10-325 MG TABS, Take 4-10 mg by mouth 2 (two) times daily as needed., Disp: 20 tablet, Rfl: 0 .  Lemborexant (DAYVIGO) 5 MG TABS, Take 5 mg by mouth at bedtime as needed., Disp: 10 tablet, Rfl: 0 .  levocetirizine (XYZAL) 5 MG tablet, TAKE 1 TABLET(5 MG) BY MOUTH EVERY DAY IN THE EVENING, Disp: 90 tablet, Rfl: 2 .  mometasone (NASONEX) 50 MCG/ACT nasal spray, Place 1 spray into the nose daily., Disp: 17 g, Rfl: 3 .  valsartan (DIOVAN) 160 MG tablet, Take 1 tablet (160 mg total) by mouth daily., Disp: 90 tablet, Rfl: 1   Allergies  Allergen Reactions  . Sulfa Antibiotics Hives     Review of Systems  Constitutional: Negative.   Respiratory: Negative.   Cardiovascular: Negative.   Gastrointestinal: Negative.   Neurological: Negative.   Psychiatric/Behavioral: Negative.      Today's Vitals   02/11/19 1613  BP: 120/86  Pulse: 86  Temp: 98.2 F (36.8 C)  TempSrc: Oral  Weight: 195 lb 3.2 oz (88.5 kg)  Height: 5' 2.6" (1.59 m)   Body mass index is 35.02 kg/m.   Objective:  Physical  Exam Vitals signs and nursing note reviewed.  Constitutional:      Appearance: Normal appearance.  HENT:     Head: Normocephalic and atraumatic.  Cardiovascular:     Rate and Rhythm: Normal rate and regular rhythm.     Heart sounds: Normal heart sounds.  Pulmonary:     Effort: Pulmonary effort is normal.     Breath sounds: Normal breath sounds.  Skin:    General: Skin is warm.  Neurological:     General: No focal deficit present.     Mental Status: She is alert.  Psychiatric:        Mood and Affect: Mood normal.        Behavior: Behavior normal.         Assessment And Plan:     1. Essential hypertension, benign  Much improved.  She will continue with Valsartan 1662mdaily. She is encouraged to continue with current meds for now. She will rto in March 2021.  She is also encouraged to incorporate more exercise into her daily routine.   2. Need for vaccination  - Tdap vaccine greater than or equal to 7yo IM  3. Drug therapy  - BMP8+EGFR   4. Class 2 severe obesity due to excess calories with serious comorbidity and  body mass index (BMI) of 35.0 to 35.9 in adult [facility])  Importance of achieving optimal weight to decrease risk of cardiovascular disease and cancers was discussed with the patient in full detail. She is encouraged to start slowly - start with 10 minutes twice daily at least three to four days per week and to gradually build to 30 minutes five days weekly. She was given tips to incorporate more activity into her daily routine - take stairs when possible, park farther away from her job, grocery stores, etc.      Maximino Greenland, MD    THE PATIENT IS ENCOURAGED TO PRACTICE SOCIAL DISTANCING DUE TO THE COVID-19 PANDEMIC.

## 2019-02-12 LAB — BMP8+EGFR
BUN/Creatinine Ratio: 19 (ref 9–23)
BUN: 15 mg/dL (ref 6–24)
CO2: 24 mmol/L (ref 20–29)
Calcium: 9.4 mg/dL (ref 8.7–10.2)
Chloride: 117 mmol/L (ref 96–106)
Creatinine, Ser: 0.77 mg/dL (ref 0.57–1.00)
GFR calc Af Amer: 105 mL/min/{1.73_m2} (ref >59)
GFR calc non Af Amer: 91 mL/min/{1.73_m2} (ref >59)
Glucose: 65 mg/dL (ref 65–99)
Potassium: 4.7 mmol/L (ref 3.5–5.2)
Sodium: 143 mmol/L (ref 134–144)

## 2019-02-17 ENCOUNTER — Encounter: Payer: Self-pay | Admitting: Internal Medicine

## 2019-02-18 ENCOUNTER — Other Ambulatory Visit: Payer: Self-pay | Admitting: Internal Medicine

## 2019-02-18 ENCOUNTER — Telehealth: Payer: Self-pay

## 2019-02-18 MED ORDER — DAYVIGO 10 MG PO TABS: 10.0000 mg | ORAL_TABLET | Freq: Every evening | ORAL | 1 refills | Status: DC | PRN

## 2019-02-18 NOTE — Telephone Encounter (Signed)
The pt was notified that samples of Dayvigo and a savings card has been placed up front for the pt to pickup.

## 2019-03-08 MED FILL — BARIAT ADVANT ADVMULT 60CT: 30 days supply | Qty: 60 | Fill #2

## 2019-03-22 ENCOUNTER — Encounter: Payer: Self-pay | Admitting: Internal Medicine

## 2019-03-22 ENCOUNTER — Other Ambulatory Visit: Payer: Self-pay

## 2019-03-22 MED ORDER — LEVOCETIRIZINE DIHYDROCHLORIDE 5 MG PO TABS: ORAL_TABLET | ORAL | 2 refills | Status: DC

## 2019-03-23 ENCOUNTER — Other Ambulatory Visit: Payer: Self-pay

## 2019-03-23 MED ORDER — LEVOCETIRIZINE DIHYDROCHLORIDE 5 MG PO TABS: ORAL_TABLET | ORAL | 2 refills | Status: DC

## 2019-05-31 MED FILL — BARIAT ADVANT ADVMULT 60CT: 30 days supply | Qty: 60 | Fill #3

## 2019-06-17 ENCOUNTER — Ambulatory Visit: Payer: BC Managed Care – PPO | Attending: Family

## 2019-06-17 DIAGNOSIS — Z23 Encounter for immunization: Secondary | ICD-10-CM | POA: Insufficient documentation

## 2019-06-17 NOTE — Progress Notes (Signed)
   Covid-19 Vaccination Clinic  Name:  TIJAH HANE    MRN: [id number] DOB: Aug 28, 1970  06/17/2019  Ms. Mol was observed post Covid-19 immunization for 15 minutes without incident. She was provided with Vaccine Information Sheet and instruction to access the V-Safe system.   Ms. Kreger was instructed to call 911 with any severe reactions post vaccine: Marland Kitchen Difficulty breathing  . Swelling of face and throat  . A fast heartbeat  . A bad rash all over body  . Dizziness and weakness   Immunizations Administered    Name Date Dose VIS Date Route   Moderna COVID-19 Vaccine 06/17/2019  4:06 PM 0.5 mL 03/16/2019 Intramuscular   Manufacturer: Moderna   Lot: [id number]   NDC: 35789-784-78

## 2019-06-24 ENCOUNTER — Other Ambulatory Visit: Payer: Self-pay

## 2019-06-24 ENCOUNTER — Ambulatory Visit: Payer: BC Managed Care – PPO | Admitting: Internal Medicine

## 2019-06-24 ENCOUNTER — Encounter: Payer: Self-pay | Admitting: Internal Medicine

## 2019-06-24 VITALS — BP 132/78 | HR 91 | Temp 98.2°F | Ht 63.2 in | Wt 202.4 lb

## 2019-06-24 DIAGNOSIS — E119 Type 2 diabetes mellitus without complications: Secondary | ICD-10-CM | POA: Diagnosis not present

## 2019-06-24 DIAGNOSIS — L309 Dermatitis, unspecified: Secondary | ICD-10-CM

## 2019-06-24 DIAGNOSIS — I10 Essential (primary) hypertension: Secondary | ICD-10-CM

## 2019-06-24 DIAGNOSIS — Z6835 Body mass index (BMI) 35.0-35.9, adult: Secondary | ICD-10-CM

## 2019-06-24 DIAGNOSIS — Z9884 Bariatric surgery status: Secondary | ICD-10-CM

## 2019-06-24 MED ORDER — VALSARTAN 160 MG PO TABS: 160.0000 mg | ORAL_TABLET | Freq: Every day | ORAL | 1 refills | Status: DC

## 2019-06-24 MED ORDER — CLOTRIMAZOLE-BETAMETHASONE 1-0.05 % EX CREA: TOPICAL_CREAM | CUTANEOUS | 1 refills | Status: AC

## 2019-06-24 NOTE — Patient Instructions (Signed)

## 2019-06-24 NOTE — Progress Notes (Signed)
This visit occurred during the SARS-CoV-2 public health emergency.  Safety protocols were in place, including screening questions prior to the visit, additional usage of staff PPE, and extensive cleaning of exam room while observing appropriate contact time as indicated for disinfecting solutions.  Subjective:     Patient ID: Sue Bell , female    DOB: [birth year] , 49 y.o.   MRN: [id number]   Chief Complaint  Patient presents with  . Hypertension    HPI  Hypertension This is a chronic problem. The current episode started more than 1 year ago. The problem has been gradually improving since onset. The problem is controlled. Pertinent negatives include no blurred vision, chest pain, palpitations or shortness of breath. The current treatment provides moderate improvement. Compliance problems include exercise.      Past Medical History:  Diagnosis Date  . Diabetes mellitus without complication ([place])   . Hyperlipidemia   . Hypertension   . OSA (obstructive sleep apnea) 07/08/2016     Family History  Problem Relation Age of Onset  . Heart disease Mother   . Diabetes Father   . Cancer Father   . Breast cancer Sister      Current Outpatient Medications:  .  Calcium-Magnesium-Vitamin D (CALCIUM 500 PO), Take by mouth., Disp: , Rfl:  .  Chlorphen-PE-Acetaminophen (NOREL AD) 4-10-325 MG TABS, Take 4-10 mg by mouth 2 (two) times daily as needed., Disp: 20 tablet, Rfl: 0 .  Lemborexant (DAYVIGO) 10 MG TABS, Take 10 mg by mouth at bedtime as needed., Disp: 30 tablet, Rfl: 1 .  levocetirizine (XYZAL) 5 MG tablet, TAKE 1 TABLET(5 MG) BY MOUTH EVERY DAY IN THE EVENING, Disp: 90 tablet, Rfl: 2 .  mometasone (NASONEX) 50 MCG/ACT nasal spray, Place 1 spray into the nose daily., Disp: 17 g, Rfl: 3 .  valsartan (DIOVAN) 160 MG tablet, Take 1 tablet (160 mg total) by mouth daily., Disp: 90 tablet, Rfl: 1 .  clotrimazole-betamethasone (LOTRISONE) cream, Apply to affected area 2 times daily,  Disp: 15 g, Rfl: 1   Allergies  Allergen Reactions  . Sulfa Antibiotics Hives     Review of Systems  Constitutional: Negative.   Eyes: Negative for blurred vision.  Respiratory: Negative.  Negative for shortness of breath.   Cardiovascular: Negative.  Negative for chest pain and palpitations.  Gastrointestinal: Negative.   Skin: Positive for rash.       She c/o rash on her neck. She thinks it may have been triggered by COVID vaccine. She received 1st Moderna vaccine on 3/4. She states it is dry and itchy. No blisters noted.   Neurological: Negative.   Psychiatric/Behavioral: Negative.      Today's Vitals   06/24/19 0918  BP: 132/78  Pulse: 91  Temp: 98.2 F (36.8 C)  Weight: 202 lb 6.4 oz (91.8 kg)  Height: 5' 3.2" (1.605 m)   Body mass index is 35.63 kg/m.   Wt Readings from Last 3 Encounters:  06/24/19 202 lb 6.4 oz (91.8 kg)  02/11/19 195 lb 3.2 oz (88.5 kg)  01/25/19 193 lb (87.5 kg)     Objective:  Physical Exam Vitals and nursing note reviewed.  Constitutional:      Appearance: Normal appearance.  HENT:     Head: Normocephalic and atraumatic.  Cardiovascular:     Rate and Rhythm: Normal rate and regular rhythm.     Heart sounds: Normal heart sounds.  Pulmonary:     Effort: Pulmonary effort is normal.  Breath sounds: Normal breath sounds.  Skin:    General: Skin is warm.  Neurological:     General: No focal deficit present.     Mental Status: She is alert.  Psychiatric:        Mood and Affect: Mood normal.        Behavior: Behavior normal.         Assessment And Plan:   1. Essential hypertension, benign  Chronic, fair control. Pt is aware that optimal BP is less than 130/80. She is encouraged to incorporate more exercise into her daily routine. I will check renal function today. She will rto in 4 months for re-evaluation. Pt advised I will add another medication if needed at that time.   - BMP8+EGFR  2. Dermatitis  I am not sure that  this is related to COVID vaccine; however, she has had allergic reactions to injectable meds for diabetes in the past. She was given rx lotrisone cream to apply to affected area twice daily as needed.  She will let me know if her sx persist.   3. Diabetes mellitus without complication ([place])  Chronic, she is now off of meds since having bariatric surgery. I will make further recommendations once her labs are available for review.   - Hemoglobin A1c  4. Class 2 severe obesity with body mass index (BMI) of 35 to 39.9 with serious comorbidity ([place])  She is aware of 5 pound weight gain since October 2020. Again, importance of regular exercise was discussed with the patient. She is encouraged to strive for at least 150 minutes of exercise per week.    Maximino Greenland, MD    THE PATIENT IS ENCOURAGED TO PRACTICE SOCIAL DISTANCING DUE TO THE COVID-19 PANDEMIC.

## 2019-06-25 LAB — HEMOGLOBIN A1C
Est. average glucose Bld gHb Est-mCnc: 123 mg/dL
Hgb A1c MFr Bld: 5.9 % — ABNORMAL HIGH (ref 4.8–5.6)

## 2019-06-25 LAB — BMP8+EGFR
BUN/Creatinine Ratio: 16 (ref 9–23)
BUN: 13 mg/dL (ref 6–24)
CO2: 26 mmol/L (ref 20–29)
Calcium: 9.4 mg/dL (ref 8.7–10.2)
Chloride: 105 mmol/L (ref 96–106)
Creatinine, Ser: 0.79 mg/dL (ref 0.57–1.00)
GFR calc Af Amer: 102 mL/min/{1.73_m2} (ref >59)
GFR calc non Af Amer: 88 mL/min/{1.73_m2} (ref >59)
Glucose: 93 mg/dL (ref 65–99)
Potassium: 4.6 mmol/L (ref 3.5–5.2)
Sodium: 144 mmol/L (ref 134–144)

## 2019-07-20 ENCOUNTER — Ambulatory Visit: Payer: BC Managed Care – PPO | Attending: Family

## 2019-07-20 DIAGNOSIS — Z23 Encounter for immunization: Secondary | ICD-10-CM

## 2019-07-20 NOTE — Progress Notes (Signed)
   Covid-19 Vaccination Clinic  Name:  KIRBI FARRUGIA    MRN: [id number] DOB: 16-Sep-1970  07/20/2019  Ms. Ponti was observed post Covid-19 immunization for 15 minutes without incident. She was provided with Vaccine Information Sheet and instruction to access the V-Safe system.   Ms. Callins was instructed to call 911 with any severe reactions post vaccine: Marland Kitchen Difficulty breathing  . Swelling of face and throat  . A fast heartbeat  . A bad rash all over body  . Dizziness and weakness   Immunizations Administered    Name Date Dose VIS Date Route   Moderna COVID-19 Vaccine 07/20/2019  4:24 PM 0.5 mL 03/16/2019 Intramuscular   Manufacturer: Moderna   Lot: [id number]   NDC: 10404-591-36

## 2019-07-21 ENCOUNTER — Encounter: Payer: Self-pay | Admitting: Internal Medicine

## 2019-07-22 ENCOUNTER — Encounter: Payer: Self-pay | Admitting: Internal Medicine

## 2019-07-22 ENCOUNTER — Other Ambulatory Visit: Payer: Self-pay | Admitting: Internal Medicine

## 2019-07-22 MED ORDER — DAYVIGO 10 MG PO TABS: 10.0000 mg | ORAL_TABLET | Freq: Every evening | ORAL | 1 refills | Status: DC | PRN

## 2019-09-29 ENCOUNTER — Other Ambulatory Visit: Payer: Self-pay | Admitting: Internal Medicine

## 2019-09-29 DIAGNOSIS — Z1231 Encounter for screening mammogram for malignant neoplasm of breast: Secondary | ICD-10-CM

## 2019-09-30 ENCOUNTER — Encounter: Payer: Self-pay | Admitting: Internal Medicine

## 2019-09-30 LAB — HM DIABETES EYE EXAM

## 2019-10-12 ENCOUNTER — Other Ambulatory Visit: Payer: Self-pay

## 2019-10-12 ENCOUNTER — Ambulatory Visit: Payer: BC Managed Care – PPO | Admitting: Internal Medicine

## 2019-10-12 ENCOUNTER — Encounter: Payer: Self-pay | Admitting: Internal Medicine

## 2019-10-12 VITALS — BP 126/74 | HR 86 | Temp 98.0°F | Ht 63.0 in | Wt 214.2 lb

## 2019-10-12 DIAGNOSIS — I10 Essential (primary) hypertension: Secondary | ICD-10-CM | POA: Diagnosis not present

## 2019-10-12 DIAGNOSIS — H01113 Allergic dermatitis of right eye, unspecified eyelid: Secondary | ICD-10-CM | POA: Diagnosis not present

## 2019-10-12 DIAGNOSIS — Z6837 Body mass index (BMI) 37.0-37.9, adult: Secondary | ICD-10-CM

## 2019-10-12 DIAGNOSIS — E119 Type 2 diabetes mellitus without complications: Secondary | ICD-10-CM | POA: Diagnosis not present

## 2019-10-12 DIAGNOSIS — F5101 Primary insomnia: Secondary | ICD-10-CM

## 2019-10-12 MED ORDER — DAYVIGO 10 MG PO TABS: 10.0000 mg | ORAL_TABLET | Freq: Every evening | ORAL | 2 refills | Status: DC | PRN

## 2019-10-12 MED ORDER — OLOPATADINE HCL 0.2 % OP SOLN: 1.0000 [drp] | Freq: Every day | OPHTHALMIC | 0 refills | Status: DC

## 2019-10-12 NOTE — Progress Notes (Signed)
This visit occurred during the SARS-CoV-2 public health emergency.  Safety protocols were in place, including screening questions prior to the visit, additional usage of staff PPE, and extensive cleaning of exam room while observing appropriate contact time as indicated for disinfecting solutions.  Subjective:     Patient ID: Sue Bell , female    DOB: [birth year] , 49 y.o.   MRN: [id number]   Chief Complaint  Patient presents with  . swollen eye    HPI  She presents today for further evaluation of swollen right eye. Her sx started late Thursday. Her eye was initially itchy. She awakened Friday with swollen eye. Denies visual disturbance. She is not sure what may have triggered her sx. Denies use of eye makeup recently.     Past Medical History:  Diagnosis Date  . Diabetes mellitus without complication ([place])   . Hyperlipidemia   . Hypertension   . OSA (obstructive sleep apnea) 07/08/2016     Family History  Problem Relation Age of Onset  . Heart disease Mother   . Diabetes Father   . Cancer Father   . Breast cancer Sister      Current Outpatient Medications:  .  Calcium-Magnesium-Vitamin D (CALCIUM 500 PO), Take by mouth., Disp: , Rfl:  .  Chlorphen-PE-Acetaminophen (NOREL AD) 4-10-325 MG TABS, Take 4-10 mg by mouth 2 (two) times daily as needed., Disp: 20 tablet, Rfl: 0 .  Lemborexant (DAYVIGO) 10 MG TABS, Take 10 mg by mouth at bedtime as needed., Disp: 30 tablet, Rfl: 1 .  levocetirizine (XYZAL) 5 MG tablet, TAKE 1 TABLET(5 MG) BY MOUTH EVERY DAY IN THE EVENING, Disp: 90 tablet, Rfl: 2 .  mometasone (NASONEX) 50 MCG/ACT nasal spray, Place 1 spray into the nose daily., Disp: 17 g, Rfl: 3 .  valsartan (DIOVAN) 160 MG tablet, Take 1 tablet (160 mg total) by mouth daily., Disp: 90 tablet, Rfl: 1   Allergies  Allergen Reactions  . Sulfa Antibiotics Hives     Review of Systems  Constitutional: Negative.   Respiratory: Negative.   Cardiovascular: Negative.    Gastrointestinal: Negative.   Neurological: Negative.   Psychiatric/Behavioral: Negative.      Today's Vitals   10/12/19 1548  BP: 126/74  Pulse: 86  Temp: 98 F (36.7 C)  TempSrc: Oral  Weight: 214 lb 3.2 oz (97.2 kg)  Height: _0  (1.6 m)   Body mass index is 37.94 kg/m.   Objective:  Physical Exam Vitals and nursing note reviewed.  Constitutional:      Appearance: Normal appearance.  HENT:     Head: Normocephalic and atraumatic.  Eyes:     General:        Left eye: No discharge.     Extraocular Movements: Extraocular movements intact.     Conjunctiva/sclera:     Right eye: Right conjunctiva is injected.     Comments: Right upper lid is erythematous, sl. Scaly.   Cardiovascular:     Rate and Rhythm: Normal rate and regular rhythm.     Heart sounds: Normal heart sounds.  Pulmonary:     Effort: Pulmonary effort is normal.     Breath sounds: Normal breath sounds.  Skin:    General: Skin is warm.  Neurological:     General: No focal deficit present.     Mental Status: She is alert.  Psychiatric:        Mood and Affect: Mood normal.        Behavior: Behavior normal.  Assessment And Plan:   1. Eyelid dermatitis, allergic/contact, right  She was given rx Pataday drops to use prn. Also advised to use OTC HC cream to affected area sparingly. She is encouraged to contact me in a day or two to let me know how this is working for her.   2. Essential hypertension, benign  Chronic, well controlled. She will continue with current meds.   3. Diabetes mellitus without complication ([facility])  Chronic, now off of meds. She has not needed meds since bariatric surgery. I will check labs as listed below.   - Hemoglobin A1c - BMP8+EGFR  4. Class 2 severe obesity due to excess calories with serious comorbidity and body mass index (BMI) of 37.0 to 37.9 in adult [facility])  She is s/p gastric Roux-en-y. She is congratulated on her weight loss thus far, she is aware of  recent weight gain. She is encouraged to initially strive for BMI less than 30 to decrease cardiac risk. She is advised to exercise no less than 150 minutes per week.    5. Primary insomnia  Chronic, she was given samples and rx Dayvigo. She is reminded she should have a bedtime routine and encouraged to use meds as needed.   Maximino Greenland, MD    THE PATIENT IS ENCOURAGED TO PRACTICE SOCIAL DISTANCING DUE TO THE COVID-19 PANDEMIC.

## 2019-10-12 NOTE — Patient Instructions (Addendum)
Pataday eye drops/Zaditor   Diabetes Mellitus and Exercise Exercising regularly is important for your overall health, especially when you have diabetes (diabetes mellitus). Exercising is not only about losing weight. It has many other health benefits, such as increasing muscle strength and bone density and reducing body fat and stress. This leads to improved fitness, flexibility, and endurance, all of which result in better overall health. Exercise has additional benefits for people with diabetes, including:  Reducing appetite.  Helping to lower and control blood glucose.  Lowering blood pressure.  Helping to control amounts of fatty substances (lipids) in the blood, such as cholesterol and triglycerides.  Helping the body to respond better to insulin (improving insulin sensitivity).  Reducing how much insulin the body needs.  Decreasing the risk for heart disease by: ? Lowering cholesterol and triglyceride levels. ? Increasing the levels of good cholesterol. ? Lowering blood glucose levels. What is my activity plan? Your health care provider or certified diabetes educator can help you make a plan for the type and frequency of exercise (activity plan) that works for you. Make sure that you:  Do at least 150 minutes of moderate-intensity or vigorous-intensity exercise each week. This could be brisk walking, biking, or water aerobics. ? Do stretching and strength exercises, such as yoga or weightlifting, at least 2 times a week. ? Spread out your activity over at least 3 days of the week.  Get some form of physical activity every day. ? Do not go more than 2 days in a row without some kind of physical activity. ? Avoid being inactive for more than 30 minutes at a time. Take frequent breaks to walk or stretch.  Choose a type of exercise or activity that you enjoy, and set realistic goals.  Start slowly, and gradually increase the intensity of your exercise over time. What do I  need to know about managing my diabetes?   Check your blood glucose before and after exercising. ? If your blood glucose is 240 mg/dL (77.4 mmol/L) or higher before you exercise, check your urine for ketones. If you have ketones in your urine, do not exercise until your blood glucose returns to normal. ? If your blood glucose is 100 mg/dL (5.6 mmol/L) or lower, eat a snack containing 15-20 grams of carbohydrate. Check your blood glucose 15 minutes after the snack to make sure that your level is above 100 mg/dL (5.6 mmol/L) before you start your exercise.  Know the symptoms of low blood glucose (hypoglycemia) and how to treat it. Your risk for hypoglycemia increases during and after exercise. Common symptoms of hypoglycemia can include: ? Hunger. ? Anxiety. ? Sweating and feeling clammy. ? Confusion. ? Dizziness or feeling light-headed. ? Increased heart rate or palpitations. ? Blurry vision. ? Tingling or numbness around the mouth, lips, or tongue. ? Tremors or shakes. ? Irritability.  Keep a rapid-acting carbohydrate snack available before, during, and after exercise to help prevent or treat hypoglycemia.  Avoid injecting insulin into areas of the body that are going to be exercised. For example, avoid injecting insulin into: ? The arms, when playing tennis. ? The legs, when jogging.  Keep records of your exercise habits. Doing this can help you and your health care provider adjust your diabetes management plan as needed. Write down: ? Food that you eat before and after you exercise. ? Blood glucose levels before and after you exercise. ? The type and amount of exercise you have done. ? When your insulin is expected  to peak, if you use insulin. Avoid exercising at times when your insulin is peaking.  When you start a new exercise or activity, work with your health care provider to make sure the activity is safe for you, and to adjust your insulin, medicines, or food intake as  needed.  Drink plenty of water while you exercise to prevent dehydration or heat stroke. Drink enough fluid to keep your urine clear or pale yellow. Summary  Exercising regularly is important for your overall health, especially when you have diabetes (diabetes mellitus).  Exercising has many health benefits, such as increasing muscle strength and bone density and reducing body fat and stress.  Your health care provider or certified diabetes educator can help you make a plan for the type and frequency of exercise (activity plan) that works for you.  When you start a new exercise or activity, work with your health care provider to make sure the activity is safe for you, and to adjust your insulin, medicines, or food intake as needed. This information is not intended to replace advice given to you by your health care provider. Make sure you discuss any questions you have with your health care provider. Document Revised: 10/24/2016 Document Reviewed: 09/11/2015 Elsevier Patient Education  2020 [employer].

## 2019-10-13 LAB — BMP8+EGFR
BUN/Creatinine Ratio: 20 (ref 9–23)
BUN: 15 mg/dL (ref 6–24)
CO2: 25 mmol/L (ref 20–29)
Calcium: 9.1 mg/dL (ref 8.7–10.2)
Chloride: 106 mmol/L (ref 96–106)
Creatinine, Ser: 0.74 mg/dL (ref 0.57–1.00)
GFR calc Af Amer: 110 mL/min/{1.73_m2} (ref >59)
GFR calc non Af Amer: 95 mL/min/{1.73_m2} (ref >59)
Glucose: 79 mg/dL (ref 65–99)
Potassium: 4.3 mmol/L (ref 3.5–5.2)
Sodium: 144 mmol/L (ref 134–144)

## 2019-10-13 LAB — HEMOGLOBIN A1C
Est. average glucose Bld gHb Est-mCnc: 131 mg/dL
Hgb A1c MFr Bld: 6.2 % — ABNORMAL HIGH (ref 4.8–5.6)

## 2019-10-20 ENCOUNTER — Ambulatory Visit
Admission: RE | Admit: 2019-10-20 | Discharge: 2019-10-20 | Disposition: A | Discharge status: Home / Self Care | Payer: BC Managed Care – PPO | Source: Ambulatory Visit | Attending: Internal Medicine | Admitting: Internal Medicine

## 2019-10-20 ENCOUNTER — Other Ambulatory Visit: Payer: Self-pay

## 2019-10-20 DIAGNOSIS — Z1231 Encounter for screening mammogram for malignant neoplasm of breast: Secondary | ICD-10-CM

## 2019-10-25 ENCOUNTER — Ambulatory Visit: Payer: BC Managed Care – PPO | Admitting: Internal Medicine

## 2019-11-15 LAB — HM COLONOSCOPY

## 2019-11-16 ENCOUNTER — Encounter: Payer: Self-pay | Admitting: Internal Medicine

## 2019-11-24 ENCOUNTER — Encounter: Payer: Self-pay | Admitting: Internal Medicine

## 2019-12-08 ENCOUNTER — Other Ambulatory Visit: Payer: Self-pay | Admitting: Internal Medicine

## 2020-01-11 ENCOUNTER — Encounter: Payer: BC Managed Care – PPO | Admitting: Internal Medicine

## 2020-01-20 ENCOUNTER — Other Ambulatory Visit: Payer: Self-pay

## 2020-01-20 ENCOUNTER — Encounter: Payer: Self-pay | Admitting: Internal Medicine

## 2020-01-20 ENCOUNTER — Ambulatory Visit: Payer: BC Managed Care – PPO | Admitting: Internal Medicine

## 2020-01-20 VITALS — BP 124/80 | HR 77 | Temp 98.3°F | Ht 62.6 in | Wt 223.6 lb

## 2020-01-20 DIAGNOSIS — F5101 Primary insomnia: Secondary | ICD-10-CM

## 2020-01-20 DIAGNOSIS — I10 Essential (primary) hypertension: Secondary | ICD-10-CM

## 2020-01-20 DIAGNOSIS — Z6841 Body Mass Index (BMI) 40.0 and over, adult: Secondary | ICD-10-CM

## 2020-01-20 DIAGNOSIS — E119 Type 2 diabetes mellitus without complications: Secondary | ICD-10-CM

## 2020-01-20 DIAGNOSIS — Z0000 Encounter for general adult medical examination without abnormal findings: Secondary | ICD-10-CM

## 2020-01-20 DIAGNOSIS — J301 Allergic rhinitis due to pollen: Secondary | ICD-10-CM

## 2020-01-20 LAB — POCT URINALYSIS DIPSTICK
Bilirubin, UA: NEGATIVE
Blood, UA: NEGATIVE
Glucose, UA: NEGATIVE
Ketones, UA: NEGATIVE
Leukocytes, UA: NEGATIVE
Nitrite, UA: NEGATIVE
Protein, UA: NEGATIVE
Spec Grav, UA: >=1.03 — AB (ref 1.010–1.025)
Urobilinogen, UA: 0.2 E.U./dL
pH, UA: 6 (ref 5.0–8.0)

## 2020-01-20 LAB — POCT UA - MICROALBUMIN
Albumin/Creatinine Ratio, Urine, POC: 30
Creatinine, POC: 300 mg/dL
Microalbumin Ur, POC: 30 mg/L

## 2020-01-20 MED ORDER — LEVOCETIRIZINE DIHYDROCHLORIDE 5 MG PO TABS: ORAL_TABLET | ORAL | 2 refills | Status: DC

## 2020-01-20 MED ORDER — VALSARTAN 160 MG PO TABS: 160.0000 mg | ORAL_TABLET | Freq: Every day | ORAL | 1 refills | Status: DC

## 2020-01-20 MED ORDER — DAYVIGO 10 MG PO TABS: 10.0000 mg | ORAL_TABLET | Freq: Every evening | ORAL | 2 refills | Status: DC | PRN

## 2020-01-20 NOTE — Progress Notes (Signed)
I,Katawbba Wiggins,acting as a Education administrator for Maximino Greenland, MD.,have documented all relevant documentation on the behalf of Maximino Greenland, MD,as directed by  Maximino Greenland, MD while in the presence of Maximino Greenland, MD.  This visit occurred during the SARS-CoV-2 public health emergency.  Safety protocols were in place, including screening questions prior to the visit, additional usage of staff PPE, and extensive cleaning of exam room while observing appropriate contact time as indicated for disinfecting solutions.  Subjective:     Patient ID: Sue Bell , female    DOB: [birth year] , 49 y.o.   MRN: [id number]   Chief Complaint  Patient presents with  . Annual Exam  . Diabetes  . Hypertension    HPI  She is here today for a full physical examination. She is followed by Dr. Mancel Bale GYN for her pelvic exams. She has no specific concerns at this time.   Diabetes She presents for her follow-up diabetic visit. She has type 2 diabetes mellitus. There are no hypoglycemic associated symptoms. There are no diabetic associated symptoms. Pertinent negatives for diabetes include no blurred vision and no chest pain. There are no hypoglycemic complications. Risk factors for coronary artery disease include diabetes mellitus, obesity and sedentary lifestyle. She is compliant with treatment most of the time. She participates in exercise intermittently. An ACE inhibitor/angiotensin II receptor blocker is not being taken.  Hypertension This is a chronic problem. The current episode started more than 1 year ago. The problem has been gradually improving since onset. The problem is controlled. Pertinent negatives include no blurred vision, chest pain, palpitations or shortness of breath. Past treatments include angiotensin blockers. The current treatment provides moderate improvement. Compliance problems include exercise.      Past Medical History:  Diagnosis Date  . Diabetes mellitus without  complication ([place])   . Hyperlipidemia   . Hypertension   . OSA (obstructive sleep apnea) 07/08/2016     Family History  Problem Relation Age of Onset  . Heart disease Mother   . Diabetes Father   . Cancer Father   . Breast cancer Sister      Current Outpatient Medications:  .  Calcium-Magnesium-Vitamin D (CALCIUM 500 PO), Take by mouth., Disp: , Rfl:  .  Chlorphen-PE-Acetaminophen (NOREL AD) 4-10-325 MG TABS, Take 4-10 mg by mouth 2 (two) times daily as needed., Disp: 20 tablet, Rfl: 0 .  Lemborexant (DAYVIGO) 10 MG TABS, Take 10 mg by mouth at bedtime as needed., Disp: 30 tablet, Rfl: 2 .  levocetirizine (XYZAL) 5 MG tablet, One tab po qpm, Disp: 90 tablet, Rfl: 2 .  mometasone (NASONEX) 50 MCG/ACT nasal spray, Place 1 spray into the nose daily., Disp: 17 g, Rfl: 3 .  Olopatadine HCl (PATADAY) 0.2 % SOLN, Apply 1 drop to eye daily., Disp: 2.5 mL, Rfl: 0 .  valsartan (DIOVAN) 160 MG tablet, Take 1 tablet (160 mg total) by mouth daily., Disp: 90 tablet, Rfl: 1   Allergies  Allergen Reactions  . Sulfa Antibiotics Hives      The patient states she uses status post hysterectomy for birth control. Last LMP was No LMP recorded. Patient has had a hysterectomy.. Negative for Dysmenorrhea. Negative for: breast discharge, breast lump(s), breast pain and breast self exam. Associated symptoms include abnormal vaginal bleeding. Pertinent negatives include abnormal bleeding (hematology), anxiety, decreased libido, depression, difficulty falling sleep, dyspareunia, history of infertility, nocturia, sexual dysfunction, sleep disturbances, urinary incontinence, urinary urgency, vaginal discharge and vaginal itching. Diet regular.The  patient states her exercise level is  intermittent.  . The patient's tobacco use is:  Social History   Tobacco Use  Smoking Status Never Smoker  Smokeless Tobacco Never Used  . She has been exposed to passive smoke. The patient's alcohol use is:  Social History    Substance and Sexual Activity  Alcohol Use No    Review of Systems  Constitutional: Negative.   HENT: Negative.   Eyes: Negative.  Negative for blurred vision.  Respiratory: Negative.  Negative for shortness of breath.   Cardiovascular: Negative.  Negative for chest pain and palpitations.  Gastrointestinal: Negative.   Endocrine: Negative.   Genitourinary: Negative.   Musculoskeletal: Negative.   Skin: Negative.   Allergic/Immunologic: Negative.   Neurological: Negative.   Hematological: Negative.   Psychiatric/Behavioral: Negative.      Today's Vitals   01/20/20 1417  BP: 124/80  Pulse: 77  Temp: 98.3 F (36.8 C)  TempSrc: Oral  Weight: 223 lb 9.6 oz (101.4 kg)  Height: 5' 2.6" (1.59 m)   Body mass index is 40.12 kg/m.  Wt Readings from Last 3 Encounters:  01/20/20 223 lb 9.6 oz (101.4 kg)  10/12/19 214 lb 3.2 oz (97.2 kg)  06/24/19 202 lb 6.4 oz (91.8 kg)   Objective:  Physical Exam Constitutional:      General: She is not in acute distress.    Appearance: Normal appearance. She is well-developed.  HENT:     Head: Normocephalic and atraumatic.     Right Ear: Hearing, tympanic membrane, ear canal and external ear normal. There is no impacted cerumen.     Left Ear: Hearing, tympanic membrane, ear canal and external ear normal. There is no impacted cerumen.     Nose:     Comments: Deferred, masked    Mouth/Throat:     Comments: Deferred, masked Eyes:     General: Lids are normal.     Extraocular Movements: Extraocular movements intact.     Conjunctiva/sclera: Conjunctivae normal.     Pupils: Pupils are equal, round, and reactive to light.     Funduscopic exam:    Right eye: No papilledema.        Left eye: No papilledema.  Neck:     Thyroid: No thyroid mass.     Vascular: No carotid bruit.  Cardiovascular:     Rate and Rhythm: Normal rate and regular rhythm.     Pulses: Normal pulses.          Dorsalis pedis pulses are 2+ on the right side and 2+ on  the left side.     Heart sounds: Normal heart sounds. No murmur heard.   Pulmonary:     Effort: Pulmonary effort is normal.     Breath sounds: Normal breath sounds.  Chest:     Breasts: Tanner Score is 5.        Right: Normal.        Left: Normal.  Abdominal:     General: Bowel sounds are normal. There is no distension.     Palpations: Abdomen is soft.     Tenderness: There is no abdominal tenderness.  Musculoskeletal:        General: No swelling. Normal range of motion.     Cervical back: Full passive range of motion without pain, normal range of motion and neck supple.     Right lower leg: No edema.     Left lower leg: No edema.  Feet:     Right foot:  Protective Sensation: 5 sites tested. 5 sites sensed.     Skin integrity: Skin integrity normal.     Toenail Condition: Right toenails are normal.     Left foot:     Protective Sensation: 5 sites tested. 5 sites sensed.     Skin integrity: Skin integrity normal.     Toenail Condition: Left toenails are normal.  Skin:    General: Skin is warm and dry.     Capillary Refill: Capillary refill takes less than 2 seconds.  Neurological:     General: No focal deficit present.     Mental Status: She is alert and oriented to person, place, and time.     Cranial Nerves: No cranial nerve deficit.     Sensory: No sensory deficit.  Psychiatric:        Mood and Affect: Mood normal.        Behavior: Behavior normal.        Thought Content: Thought content normal.        Judgment: Judgment normal.         Assessment And Plan:     1. Encounter for physical examination Comments: A full exam was performed. Importance of monthly self breast exams was discussed with the patient. PATIENT IS ADVISED TO GET 30-45 MINUTES REGULAR EXERCISE NO LESS THAN FOUR TO FIVE DAYS PER WEEK - BOTH WEIGHTBEARING EXERCISES AND AEROBIC ARE RECOMMENDED.  PATIENT IS ADVISED TO FOLLOW A HEALTHY DIET WITH AT LEAST SIX FRUITS/VEGGIES PER DAY, DECREASE INTAKE OF  RED MEAT, AND TO INCREASE FISH INTAKE TO TWO DAYS PER WEEK.  MEATS/FISH SHOULD NOT BE FRIED, BAKED OR BROILED IS PREFERABLE.  I SUGGEST WEARING SPF 50 SUNSCREEN ON EXPOSED PARTS AND ESPECIALLY WHEN IN THE DIRECT SUNLIGHT FOR AN EXTENDED PERIOD OF TIME.  PLEASE AVOID FAST FOOD RESTAURANTS AND INCREASE YOUR WATER INTAKE.  - EKG 12-Lead - CMP14+EGFR - CBC - Lipid panel - Hemoglobin A1c  2. Diabetes mellitus without complication ([place]) Comments: Diabetic foot exam was performed. She is no longer on meds since having bariatric surgery. I DISCUSSED WITH THE PATIENT AT LENGTH REGARDING THE GOALS OF GLYCEMIC CONTROL AND POSSIBLE LONG-TERM COMPLICATIONS.  I  ALSO STRESSED THE IMPORTANCE OF COMPLIANCE WITH HOME GLUCOSE MONITORING, DIETARY RESTRICTIONS INCLUDING AVOIDANCE OF SUGARY DRINKS/PROCESSED FOODS,  ALONG WITH REGULAR EXERCISE.  I  ALSO STRESSED THE IMPORTANCE OF ANNUAL EYE EXAMS, SELF FOOT CARE AND COMPLIANCE WITH OFFICE VISITS.   3. Essential hypertension, benign Comments: Chronic, well controlled. She will continue with current meds. She is encouraged to avoid adding salt to ther foods. EKG performed, NSR w/o acute changes. She will rto in six months for re-evaluation.  - POCT Urinalysis Dipstick (81002) - POCT UA - Microalbumin - valsartan (DIOVAN) 160 MG tablet; Take 1 tablet (160 mg total) by mouth daily.  Dispense: 90 tablet; Refill: 1  4. Primary insomnia Comments: Chronic, she was given rx Dayvigo 72m po qhs prn. She was reminded on importance of having bedtime routine.  - Lemborexant (DAYVIGO) 10 MG TABS; Take 10 mg by mouth at bedtime as needed.  Dispense: 30 tablet; Refill: 2  5. Seasonal allergic rhinitis due to pollen Comments: She was given rx Xyzal, 546mto take daily prn.  - levocetirizine (XYZAL) 5 MG tablet; One tab po qpm  Dispense: 90 tablet; Refill: 2  6. Class 3 severe obesity due to excess calories with serious comorbidity and body mass index (BMI) of 40.0 to 44.9 in  adult [facility]: Encouraged  to resume her regular exercise routine. Advised to aim for at least 150 minutes per week.   Wt Readings from Last 3 Encounters:  01/20/20 223 lb 9.6 oz (101.4 kg)  10/12/19 214 lb 3.2 oz (97.2 kg)  06/24/19 202 lb 6.4 oz (91.8 kg)      Patient was given opportunity to ask questions. Patient verbalized understanding of the plan and was able to repeat key elements of the plan. All questions were answered to their satisfaction.   Maximino Greenland, MD   I, Maximino Greenland, MD, have reviewed all documentation for this visit. The documentation on 02/06/20 for the exam, diagnosis, procedures, and orders are all accurate and complete.  THE PATIENT IS ENCOURAGED TO PRACTICE SOCIAL DISTANCING DUE TO THE COVID-19 PANDEMIC.

## 2020-01-20 NOTE — Patient Instructions (Signed)
Health Maintenance, Female Adopting a healthy lifestyle and getting preventive care are important in promoting health and wellness. Ask your health care provider about:  The right schedule for you to have regular tests and exams.  Things you can do on your own to prevent diseases and keep yourself healthy. What should I know about diet, weight, and exercise? Eat a healthy diet   Eat a diet that includes plenty of vegetables, fruits, low-fat dairy products, and lean protein.  Do not eat a lot of foods that are high in solid fats, added sugars, or sodium. Maintain a healthy weight Body mass index (BMI) is used to identify weight problems. It estimates body fat based on height and weight. Your health care provider can help determine your BMI and help you achieve or maintain a healthy weight. Get regular exercise Get regular exercise. This is one of the most important things you can do for your health. Most adults should:  Exercise for at least 150 minutes each week. The exercise should increase your heart rate and make you sweat (moderate-intensity exercise).  Do strengthening exercises at least twice a week. This is in addition to the moderate-intensity exercise.  Spend less time sitting. Even light physical activity can be beneficial. Watch cholesterol and blood lipids Have your blood tested for lipids and cholesterol at 50 years of age, then have this test every 5 years. Have your cholesterol levels checked more often if:  Your lipid or cholesterol levels are high.  You are older than 50 years of age.  You are at high risk for heart disease. What should I know about cancer screening? Depending on your health history and family history, you may need to have cancer screening at various ages. This may include screening for:  Breast cancer.  Cervical cancer.  Colorectal cancer.  Skin cancer.  Lung cancer. What should I know about heart disease, diabetes, and high blood  pressure? Blood pressure and heart disease  High blood pressure causes heart disease and increases the risk of stroke. This is more likely to develop in people who have high blood pressure readings, are of African descent, or are overweight.  Have your blood pressure checked: ? Every 3-5 years if you are 18-39 years of age. ? Every year if you are 40 years old or older. Diabetes Have regular diabetes screenings. This checks your fasting blood sugar level. Have the screening done:  Once every three years after age 40 if you are at a normal weight and have a low risk for diabetes.  More often and at a younger age if you are overweight or have a high risk for diabetes. What should I know about preventing infection? Hepatitis B If you have a higher risk for hepatitis B, you should be screened for this virus. Talk with your health care provider to find out if you are at risk for hepatitis B infection. Hepatitis C Testing is recommended for:  Everyone born from 1945 through 1965.  Anyone with known risk factors for hepatitis C. Sexually transmitted infections (STIs)  Get screened for STIs, including gonorrhea and chlamydia, if: ? You are sexually active and are younger than 50 years of age. ? You are older than 50 years of age and your health care provider tells you that you are at risk for this type of infection. ? Your sexual activity has changed since you were last screened, and you are at increased risk for chlamydia or gonorrhea. Ask your health care provider if   you are at risk.  Ask your health care provider about whether you are at high risk for HIV. Your health care provider may recommend a prescription medicine to help prevent HIV infection. If you choose to take medicine to prevent HIV, you should first get tested for HIV. You should then be tested every 3 months for as long as you are taking the medicine. Pregnancy  If you are about to stop having your period (premenopausal) and  you may become pregnant, seek counseling before you get pregnant.  Take 400 to 800 micrograms (mcg) of folic acid every day if you become pregnant.  Ask for birth control (contraception) if you want to prevent pregnancy. Osteoporosis and menopause Osteoporosis is a disease in which the bones lose minerals and strength with aging. This can result in bone fractures. If you are 65 years old or older, or if you are at risk for osteoporosis and fractures, ask your health care provider if you should:  Be screened for bone loss.  Take a calcium or vitamin D supplement to lower your risk of fractures.  Be given hormone replacement therapy (HRT) to treat symptoms of menopause. Follow these instructions at home: Lifestyle  Do not use any products that contain nicotine or tobacco, such as cigarettes, e-cigarettes, and chewing tobacco. If you need help quitting, ask your health care provider.  Do not use street drugs.  Do not share needles.  Ask your health care provider for help if you need support or information about quitting drugs. Alcohol use  Do not drink alcohol if: ? Your health care provider tells you not to drink. ? You are pregnant, may be pregnant, or are planning to become pregnant.  If you drink alcohol: ? Limit how much you use to 0-1 drink a day. ? Limit intake if you are breastfeeding.  Be aware of how much alcohol is in your drink. In the [place] one drink equals one 12 oz bottle of beer (355 mL), one 5 oz glass of wine (148 mL), or one 1 oz glass of hard liquor (44 mL). General instructions  Schedule regular health, dental, and eye exams.  Stay current with your vaccines.  Tell your health care provider if: ? You often feel depressed. ? You have ever been abused or do not feel safe at home. Summary  Adopting a healthy lifestyle and getting preventive care are important in promoting health and wellness.  Follow your health care provider's instructions about healthy  diet, exercising, and getting tested or screened for diseases.  Follow your health care provider's instructions on monitoring your cholesterol and blood pressure. This information is not intended to replace advice given to you by your health care provider. Make sure you discuss any questions you have with your health care provider. Document Revised: 03/25/2018 Document Reviewed: 03/25/2018 Elsevier Patient Education  2020 Elsevier Inc.  

## 2020-01-21 LAB — LIPID PANEL
Chol/HDL Ratio: 3.9 ratio (ref 0.0–4.4)
Cholesterol, Total: 268 mg/dL — ABNORMAL HIGH (ref 100–199)
HDL: 68 mg/dL (ref >39)
LDL Chol Calc (NIH): 176 mg/dL — ABNORMAL HIGH (ref 0–99)
Triglycerides: 134 mg/dL (ref 0–149)
VLDL Cholesterol Cal: 24 mg/dL (ref 5–40)

## 2020-01-21 LAB — HEMOGLOBIN A1C
Est. average glucose Bld gHb Est-mCnc: 131 mg/dL
Hgb A1c MFr Bld: 6.2 % — ABNORMAL HIGH (ref 4.8–5.6)

## 2020-01-21 LAB — CBC
Hematocrit: 34.7 % (ref 34.0–46.6)
Hemoglobin: 11.6 g/dL (ref 11.1–15.9)
MCH: 27.9 pg (ref 26.6–33.0)
MCHC: 33.4 g/dL (ref 31.5–35.7)
MCV: 83 fL (ref 79–97)
Platelets: 302 10*3/uL (ref 150–450)
RBC: 4.16 x10E6/uL (ref 3.77–5.28)
RDW: 13.1 % (ref 11.7–15.4)
WBC: 6.3 10*3/uL (ref 3.4–10.8)

## 2020-01-21 LAB — CMP14+EGFR
ALT: 12 IU/L (ref 0–32)
AST: 16 IU/L (ref 0–40)
Albumin/Globulin Ratio: 1.7 (ref 1.2–2.2)
Albumin: 4.2 g/dL (ref 3.8–4.8)
Alkaline Phosphatase: 77 IU/L (ref 44–121)
BUN/Creatinine Ratio: 19 (ref 9–23)
BUN: 12 mg/dL (ref 6–24)
Bilirubin Total: <0.2 mg/dL (ref 0.0–1.2)
CO2: 24 mmol/L (ref 20–29)
Calcium: 8.9 mg/dL (ref 8.7–10.2)
Chloride: 107 mmol/L — ABNORMAL HIGH (ref 96–106)
Creatinine, Ser: 0.63 mg/dL (ref 0.57–1.00)
GFR calc Af Amer: 121 mL/min/{1.73_m2} (ref >59)
GFR calc non Af Amer: 105 mL/min/{1.73_m2} (ref >59)
Globulin, Total: 2.5 g/dL (ref 1.5–4.5)
Glucose: 72 mg/dL (ref 65–99)
Potassium: 4.1 mmol/L (ref 3.5–5.2)
Sodium: 143 mmol/L (ref 134–144)
Total Protein: 6.7 g/dL (ref 6.0–8.5)

## 2020-02-25 ENCOUNTER — Ambulatory Visit: Payer: BC Managed Care – PPO | Attending: Internal Medicine

## 2020-02-25 DIAGNOSIS — Z23 Encounter for immunization: Secondary | ICD-10-CM

## 2020-02-25 NOTE — Progress Notes (Signed)
   Covid-[facility] Vaccination Clinic  Name:  Sue Bell    MRN: [id number] DOB: Jan 01, 1971  02/25/2020  Sue Bell was observed post Covid-19 immunization for 15 minutes without incident. She was provided with Vaccine Information Sheet and instruction to access the V-Safe system.   Sue Bell was instructed to call 911 with any severe reactions post vaccine: Marland Kitchen Difficulty breathing  . Swelling of face and throat  . A fast heartbeat  . A bad rash all over body  . Dizziness and weakness

## 2020-05-02 ENCOUNTER — Encounter (HOSPITAL_COMMUNITY): Payer: Self-pay

## 2020-05-22 ENCOUNTER — Other Ambulatory Visit: Payer: Self-pay

## 2020-05-22 ENCOUNTER — Ambulatory Visit: Payer: BC Managed Care – PPO | Admitting: Internal Medicine

## 2020-05-22 ENCOUNTER — Encounter: Payer: Self-pay | Admitting: Internal Medicine

## 2020-05-22 VITALS — BP 122/84 | HR 89 | Temp 98.3°F | Ht 62.6 in | Wt 226.0 lb

## 2020-05-22 DIAGNOSIS — F5101 Primary insomnia: Secondary | ICD-10-CM

## 2020-05-22 DIAGNOSIS — E1169 Type 2 diabetes mellitus with other specified complication: Secondary | ICD-10-CM

## 2020-05-22 DIAGNOSIS — I10 Essential (primary) hypertension: Secondary | ICD-10-CM

## 2020-05-22 DIAGNOSIS — E119 Type 2 diabetes mellitus without complications: Secondary | ICD-10-CM

## 2020-05-22 DIAGNOSIS — E7800 Pure hypercholesterolemia, unspecified: Secondary | ICD-10-CM

## 2020-05-22 DIAGNOSIS — Z23 Encounter for immunization: Secondary | ICD-10-CM

## 2020-05-22 DIAGNOSIS — Z6841 Body Mass Index (BMI) 40.0 and over, adult: Secondary | ICD-10-CM

## 2020-05-22 MED ORDER — DAYVIGO 10 MG PO TABS: 10.0000 mg | ORAL_TABLET | Freq: Every evening | ORAL | 2 refills | Status: DC | PRN

## 2020-05-22 NOTE — Progress Notes (Signed)
Rutherford Nail as a scribe for Maximino Greenland, MD.,have documented all relevant documentation on the behalf of Maximino Greenland, MD,as directed by  Maximino Greenland, MD while in the presence of Maximino Greenland, MD. This visit occurred during the SARS-CoV-2 public health emergency.  Safety protocols were in place, including screening questions prior to the visit, additional usage of staff PPE, and extensive cleaning of exam room while observing appropriate contact time as indicated for disinfecting solutions.  Subjective:     Patient ID: Sue Bell , female    DOB: February 10, 1971 , 50 y.o.   MRN: [id number]   Chief Complaint  Patient presents with  . Hypertension    HPI  Pt here today for b/p follow up. She reports compliance with meds. Denies headaches, chest pain and shortness of breath. Reports she has joined [employer] and plans to start a regular exercise regimen.   Hypertension This is a chronic problem. The current episode started more than 1 year ago. The problem has been gradually improving since onset. The problem is controlled. Pertinent negatives include no blurred vision, chest pain, headaches, palpitations or shortness of breath. Risk factors for coronary artery disease include obesity, diabetes mellitus and dyslipidemia. Past treatments include angiotensin blockers. The current treatment provides moderate improvement. Compliance problems include exercise.      Past Medical History:  Diagnosis Date  . Diabetes mellitus without complication ([place])   . Hyperlipidemia   . Hypertension   . OSA (obstructive sleep apnea) 07/08/2016     Family History  Problem Relation Age of Onset  . Heart disease Mother   . Diabetes Father   . Cancer Father   . Breast cancer Sister      Current Outpatient Medications:  .  Calcium-Magnesium-Vitamin D (CALCIUM 500 PO), Take by mouth., Disp: , Rfl:  .  Chlorphen-PE-Acetaminophen (NOREL AD) 4-10-325 MG TABS, Take 4-10 mg by  mouth 2 (two) times daily as needed., Disp: 20 tablet, Rfl: 0 .  levocetirizine (XYZAL) 5 MG tablet, One tab po qpm, Disp: 90 tablet, Rfl: 2 .  mometasone (NASONEX) 50 MCG/ACT nasal spray, Place 1 spray into the nose daily., Disp: 17 g, Rfl: 3 .  Olopatadine HCl (PATADAY) 0.2 % SOLN, Apply 1 drop to eye daily., Disp: 2.5 mL, Rfl: 0 .  valsartan (DIOVAN) 160 MG tablet, Take 1 tablet (160 mg total) by mouth daily., Disp: 90 tablet, Rfl: 1 .  Lemborexant (DAYVIGO) 10 MG TABS, Take 10 mg by mouth at bedtime as needed., Disp: 30 tablet, Rfl: 2   Allergies  Allergen Reactions  . Sulfa Antibiotics Hives     Review of Systems  Constitutional: Negative.  Negative for fatigue.  HENT: Negative.   Eyes: Negative for blurred vision.  Respiratory: Negative.  Negative for shortness of breath.   Cardiovascular: Negative.  Negative for chest pain and palpitations.  Endocrine: Negative for polydipsia, polyphagia and polyuria.  Musculoskeletal: Negative.   Skin: Negative.   Neurological: Negative for dizziness, numbness and headaches.  Psychiatric/Behavioral: Positive for sleep disturbance.     Today's Vitals   05/22/20 1225  BP: 122/84  Pulse: 89  Temp: 98.3 F (36.8 C)  TempSrc: Oral  Weight: 226 lb (102.5 kg)  Height: 5' 2.6" (1.59 m)   Body mass index is 40.55 kg/m.   Objective:  Physical Exam Vitals and nursing note reviewed.  Constitutional:      Appearance: Normal appearance. She is obese.  HENT:     Head:  Normocephalic and atraumatic.  Cardiovascular:     Rate and Rhythm: Normal rate and regular rhythm.     Heart sounds: Normal heart sounds.  Pulmonary:     Effort: Pulmonary effort is normal.     Breath sounds: Normal breath sounds.  Skin:    General: Skin is warm.  Neurological:     General: No focal deficit present.     Mental Status: She is alert.  Psychiatric:        Mood and Affect: Mood normal.        Behavior: Behavior normal.         Assessment And Plan:      1. Essential hypertension, benign Comments: Chronic, well controlled. She will continue with current meds. She is encouraged to folllow low sodium diet.   2. Type 2 diabetes mellitus with hypercholesterolemia ([place]) Comments: Chronic. I will check an a1c today. Encouraged to avoid sugary beverages. She will f/u in four months for re-evaluation.  - BMP8+EGFR - Hemoglobin A1c - TSH  3. Primary insomnia Comments: Chronic, she was given rx Dayvigo 58m po qhs prn. She was reminded on importance of having bedtime routine.  - Lemborexant (DAYVIGO) 10 MG TABS; Take 10 mg by mouth at bedtime as needed.  Dispense: 30 tablet; Refill: 2  4. Class 3 severe obesity due to excess calories with serious comorbidity and body mass index (BMI) of 40.0 to 44.9 in adult ([facility] Comments: BMI 40. She was congratulated on her proposed lifestyle changes and encouraged to aim for at least 150 minutes of exercise per week.   5. Immunization due Comments: She was given flu vaccine to update her immunization history.   Patient was given opportunity to ask questions. Patient verbalized understanding of the plan and was able to repeat key elements of the plan. All questions were answered to their satisfaction.  RMaximino Greenland MD   I, RMaximino Greenland MD, have reviewed all documentation for this visit. The documentation on 05/24/20 for the exam, diagnosis, procedures, and orders are all accurate and complete.  THE PATIENT IS ENCOURAGED TO PRACTICE SOCIAL DISTANCING DUE TO THE COVID-19 PANDEMIC.

## 2020-05-22 NOTE — Patient Instructions (Signed)

## 2020-05-23 LAB — BMP8+EGFR
BUN/Creatinine Ratio: 13 (ref 9–23)
BUN: 11 mg/dL (ref 6–24)
CO2: 23 mmol/L (ref 20–29)
Calcium: 9.8 mg/dL (ref 8.7–10.2)
Chloride: 103 mmol/L (ref 96–106)
Creatinine, Ser: 0.84 mg/dL (ref 0.57–1.00)
GFR calc Af Amer: 94 mL/min/{1.73_m2} (ref >59)
GFR calc non Af Amer: 81 mL/min/{1.73_m2} (ref >59)
Glucose: 89 mg/dL (ref 65–99)
Potassium: 4.3 mmol/L (ref 3.5–5.2)
Sodium: 143 mmol/L (ref 134–144)

## 2020-05-23 LAB — HEMOGLOBIN A1C
Est. average glucose Bld gHb Est-mCnc: 137 mg/dL
Hgb A1c MFr Bld: 6.4 % — ABNORMAL HIGH (ref 4.8–5.6)

## 2020-05-23 LAB — TSH: TSH: 1.02 u[IU]/mL (ref 0.450–4.500)

## 2020-05-24 ENCOUNTER — Encounter: Payer: Self-pay | Admitting: Internal Medicine

## 2020-05-24 DIAGNOSIS — F5101 Primary insomnia: Secondary | ICD-10-CM | POA: Insufficient documentation

## 2020-05-24 DIAGNOSIS — E66811 Obesity, class 1: Secondary | ICD-10-CM | POA: Insufficient documentation

## 2020-05-24 DIAGNOSIS — E6609 Other obesity due to excess calories: Secondary | ICD-10-CM | POA: Insufficient documentation

## 2020-05-24 DIAGNOSIS — E669 Obesity, unspecified: Secondary | ICD-10-CM | POA: Insufficient documentation

## 2020-05-29 ENCOUNTER — Encounter: Payer: Self-pay | Admitting: Internal Medicine

## 2020-05-30 ENCOUNTER — Telehealth: Payer: Self-pay

## 2020-05-30 NOTE — Telephone Encounter (Signed)
The pt picked up a completed copy of her [place] [employer] insurance  Letter of medical necessity form along with confirmation that it was faxed.

## 2020-08-08 ENCOUNTER — Encounter: Payer: BC Managed Care – PPO | Admitting: Skilled Nursing Facility1

## 2020-09-07 ENCOUNTER — Ambulatory Visit: Payer: Self-pay | Admitting: Skilled Nursing Facility1

## 2020-09-14 ENCOUNTER — Other Ambulatory Visit: Payer: Self-pay

## 2020-09-14 ENCOUNTER — Encounter: Payer: BC Managed Care – PPO | Attending: Student | Admitting: Skilled Nursing Facility1

## 2020-09-14 ENCOUNTER — Encounter: Payer: Self-pay | Admitting: Skilled Nursing Facility1

## 2020-09-14 DIAGNOSIS — E119 Type 2 diabetes mellitus without complications: Secondary | ICD-10-CM | POA: Diagnosis not present

## 2020-09-14 DIAGNOSIS — E669 Obesity, unspecified: Secondary | ICD-10-CM | POA: Insufficient documentation

## 2020-09-14 NOTE — Progress Notes (Signed)
Bariatric Nutrition Follow-Up Visit Medical Nutrition Therapy  [facility]  NUTRITION ASSESSMENT    Anthropometrics  Start weight at [facility]: 275.7 lbs (date: 09/14/2020) Today's weight: 228.9 lbs  Body Composition Scale 09/14/2020  Weight  lbs 228.9  Total Body Fat  % 44.2     Visceral Fat   Fat-Free Mass  %      Total Body Water  % 42.3     Muscle-Mass  lbs   BMI   Body Fat Displacement ---        Torso  lbs         Left Leg  lbs         Right Leg  lbs         Left Arm  lbs         Right Arm  lbs    Clinical  Medical hx: DM, HTN, hyperlipidemia Medications: see list Labs: A1C 6.4   Lifestyle & Dietary Hx  Pt states in the last year she has a lot of work stress and 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  Pt states she has noticed she snacks more while at work.  Pt states when she is with her aunt and uncle she is stressed so snacks on the calorically dense snacks they have. Pt states she has trouble with thinking too far ahead and not staying in the moment.  Pt states she feels if she stops buying snacks she will not eat them at work. Pt states she is hoping to hire more people at work which will reduce some stress.  Estimated daily fluid intake: 40-50 oz Estimated daily protein intake: 60 g Supplements: inconsistent with mutli  Current average weekly physical activity: ADL's  24-Hr Dietary Recall: eats out 2-3 times a week First Meal: coffee  OR protein shake or half bagel + cream cheese Snack:  Second Meal: salad or sandwich  Snack: pretzels Third Meal: steak + high fiber tortilla + onion + pepper Snack:  Beverages: coffee + sugar free creamer + monk fruit, water, water + flavorings   Post-Op Goals/ Signs/ Symptoms Using straws: no Drinking while eating: no Chewing/swallowing difficulties: no Changes in vision: no Changes to mood/headaches: no Hair loss/changes to skin/nails: no Difficulty focusing/concentrating: no Sweating:  no Dizziness/lightheadedness: no Palpitations: no Carbonated/caffeinated beverages: no N/V/D/C/Gas: no Abdominal pain: no Dumping syndrome: no    NUTRITION DIAGNOSIS  Overweight/obesity ([place]-3.3) related to past poor dietary habits and physical inactivity as evidenced by completed bariatric surgery and following dietary guidelines for continued weight loss and healthy nutrition status.     NUTRITION INTERVENTION Nutrition counseling (C-1) and education (E-2) to facilitate bariatric surgery goals, including: . The importance of consuming adequate calories as well as certain nutrients daily due to the body's need for essential vitamins, minerals, and fats . The importance of daily physical activity and to reach a goal of at least 150 minutes of moderate to vigorous physical activity weekly (or as directed by their physician) due to benefits such as increased musculature and improved lab values . The importance of intuitive eating specifically learning hunger-satiety cues and understanding the importance of learning a new body: The importance of mindful eating to avoid grazing behaviors   Goals:  -Calm down for the evening by reading instead of snacking -stop buying snack foods  -take 5, 5 minute breaks throughout the work day or a couple 15 minute breaks  -bring your water for those breaks to drink more water  -join  a group class at the gym  -get back into your supplement regimen; 3 calcium a day all 2 hours apart and 2 hours from multivitamin   Handouts Provided Include   Detailed MyPlate  Learning Style & Readiness for Change Teaching method utilized: Visual & Auditory  Demonstrated degree of understanding via: Teach Back   RD's Notes for Next Visit . Assess adherence to pt chosen goals   MONITORING & EVALUATION Dietary intake, weekly physical activity, body weight  Next Steps Patient is to follow-up in 6 weeks

## 2020-09-20 ENCOUNTER — Encounter: Payer: Self-pay | Admitting: Internal Medicine

## 2020-09-20 ENCOUNTER — Other Ambulatory Visit: Payer: Self-pay

## 2020-09-20 ENCOUNTER — Ambulatory Visit: Payer: BC Managed Care – PPO | Admitting: Internal Medicine

## 2020-09-20 ENCOUNTER — Other Ambulatory Visit: Payer: Self-pay | Admitting: Internal Medicine

## 2020-09-20 VITALS — BP 124/70 | HR 83 | Temp 98.3°F | Ht 63.0 in | Wt 229.8 lb

## 2020-09-20 DIAGNOSIS — E1169 Type 2 diabetes mellitus with other specified complication: Secondary | ICD-10-CM | POA: Diagnosis not present

## 2020-09-20 DIAGNOSIS — I10 Essential (primary) hypertension: Secondary | ICD-10-CM | POA: Diagnosis not present

## 2020-09-20 DIAGNOSIS — L309 Dermatitis, unspecified: Secondary | ICD-10-CM

## 2020-09-20 DIAGNOSIS — E7800 Pure hypercholesterolemia, unspecified: Secondary | ICD-10-CM

## 2020-09-20 DIAGNOSIS — Z6841 Body Mass Index (BMI) 40.0 and over, adult: Secondary | ICD-10-CM

## 2020-09-20 MED ORDER — NAFTIFINE HCL 2 % EX GEL: CUTANEOUS | 0 refills | Status: DC

## 2020-09-20 NOTE — Progress Notes (Signed)
I,Katawbba Wiggins,acting as a Education administrator for Maximino Greenland, MD.,have documented all relevant documentation on the behalf of Maximino Greenland, MD,as directed by  Maximino Greenland, MD while in the presence of Maximino Greenland, MD.  This visit occurred during the SARS-CoV-2 public health emergency.  Safety protocols were in place, including screening questions prior to the visit, additional usage of staff PPE, and extensive cleaning of exam room while observing appropriate contact time as indicated for disinfecting solutions.  Subjective:     Patient ID: Sue Bell , female    DOB: 11-Aug-1970 , 50 y.o.   MRN: [id number]   Chief Complaint  Patient presents with   Hypertension    HPI  Pt here today for b/p follow up. She reports compliance with meds. Denies headaches, chest pain and shortness of breath. Admits she is not exercising as much as she should.   The patient would like to have her feet evaluated because the skin is peeling off.  Hypertension This is a chronic problem. The current episode started more than 1 year ago. The problem has been gradually improving since onset. The problem is controlled. Pertinent negatives include no blurred vision, chest pain, headaches, palpitations or shortness of breath. Risk factors for coronary artery disease include obesity, diabetes mellitus and dyslipidemia. Past treatments include angiotensin blockers. The current treatment provides moderate improvement. Compliance problems include exercise.     Past Medical History:  Diagnosis Date   Diabetes mellitus without complication (HCC)    Hyperlipidemia    Hypertension    OSA (obstructive sleep apnea) 07/08/2016     Family History  Problem Relation Age of Onset   Heart disease Mother    Diabetes Father    Cancer Father    Breast cancer Sister      Current Outpatient Medications:    Calcium-Magnesium-Vitamin D (CALCIUM 500 PO), Take by mouth., Disp: , Rfl:    Chlorphen-PE-Acetaminophen  (NOREL AD) 4-10-325 MG TABS, Take 4-10 mg by mouth 2 (two) times daily as needed., Disp: 20 tablet, Rfl: 0   Lemborexant (DAYVIGO) 10 MG TABS, Take 10 mg by mouth at bedtime as needed., Disp: 30 tablet, Rfl: 2   levocetirizine (XYZAL) 5 MG tablet, One tab po qpm, Disp: 90 tablet, Rfl: 2   mometasone (NASONEX) 50 MCG/ACT nasal spray, Place 1 spray into the nose daily., Disp: 17 g, Rfl: 3   Naftifine HCl 2 % GEL, Apply topically to affected area daily prn, Disp: 60 g, Rfl: 0   Olopatadine HCl (PATADAY) 0.2 % SOLN, Apply 1 drop to eye daily., Disp: 2.5 mL, Rfl: 0   valsartan (DIOVAN) 160 MG tablet, Take 1 tablet (160 mg total) by mouth daily., Disp: 90 tablet, Rfl: 1   Allergies  Allergen Reactions   Sulfa Antibiotics Hives     Review of Systems  Constitutional: Negative.   Eyes:  Negative for blurred vision.  Respiratory: Negative.  Negative for shortness of breath.   Cardiovascular: Negative.  Negative for chest pain and palpitations.  Gastrointestinal: Negative.   Skin:        Peeling skin, bottom of left foot. Has h/o athlete's foot.   Neurological:  Negative for headaches.  Psychiatric/Behavioral: Negative.    All other systems reviewed and are negative.   Today's Vitals   09/20/20 1026  BP: 124/70  Pulse: 83  Temp: 98.3 F (36.8 C)  TempSrc: Oral  Weight: 229 lb 12.8 oz (104.2 kg)  Height: '5\' 3"'  (1.6 m)  Body mass index is 40.71 kg/m.  Wt Readings from Last 3 Encounters:  09/20/20 229 lb 12.8 oz (104.2 kg)  09/14/20 228 lb 14.4 oz (103.8 kg)  05/22/20 226 lb (102.5 kg)   BP Readings from Last 3 Encounters:  09/20/20 124/70  05/22/20 122/84  01/20/20 124/80   Objective:  Physical Exam Vitals and nursing note reviewed.  Constitutional:      Appearance: Normal appearance. She is obese.  HENT:     Head: Normocephalic and atraumatic.     Nose:     Comments: Masked     Mouth/Throat:     Comments: Masked  Eyes:     Extraocular Movements: Extraocular movements  intact.  Cardiovascular:     Rate and Rhythm: Normal rate and regular rhythm.     Heart sounds: Normal heart sounds.  Pulmonary:     Effort: Pulmonary effort is normal.     Breath sounds: Normal breath sounds.  Skin:    General: Skin is warm.     Coloration: Skin is not pale.     Findings: No bruising or erythema.     Comments: Peeling skin left foot. No underlying erythema.   Neurological:     General: No focal deficit present.     Mental Status: She is alert.  Psychiatric:        Mood and Affect: Mood normal.        Behavior: Behavior normal.        Assessment And Plan:     1. Essential hypertension, benign Comments: Chronic, well controlled. Advised to follow low sodium diet. I will not make any med changes today. On ARB therapy.   2. Foot dermatitis Comments: I will send rx nafitine gel to apply to affected area twice daily prn.   3. Type 2 diabetes mellitus with hypercholesterolemia ([place]) Comments: I will check an a1c today. Encouraged to follow dietary meds, s/p bariatric surgery. I will resume Ozempic 0.75m weekly.  - CMP14+EGFR - Hemoglobin A1c  4. Class 3 severe obesity due to excess calories with serious comorbidity and body mass index (BMI) of 40.0 to 44.9 in adult ([facility] She is aware of 3 pound weight gain since Feb 2022.  She is encouraged to strive for BMI less than 30 to decrease cardiac risk. Advised to aim for at least 150 minutes of exercise per week.   Patient was given opportunity to ask questions. Patient verbalized understanding of the plan and was able to repeat key elements of the plan. All questions were answered to their satisfaction.   I, RMaximino Greenland MD, have reviewed all documentation for this visit. The documentation on 09/20/20 for the exam, diagnosis, procedures, and orders are all accurate and complete.   IF YOU HAVE BEEN REFERRED TO A SPECIALIST, IT MAY TAKE 1-2 WEEKS TO SCHEDULE/PROCESS THE REFERRAL. IF YOU HAVE NOT HEARD FROM  US/SPECIALIST IN TWO WEEKS, PLEASE GIVE [place] CALL AT [phone].   THE PATIENT IS ENCOURAGED TO PRACTICE SOCIAL DISTANCING DUE TO THE COVID-19 PANDEMIC.

## 2020-09-20 NOTE — Patient Instructions (Signed)

## 2020-09-21 LAB — CMP14+EGFR
ALT: 13 IU/L (ref 0–32)
AST: 16 IU/L (ref 0–40)
Albumin/Globulin Ratio: 1.7 (ref 1.2–2.2)
Albumin: 4.4 g/dL (ref 3.8–4.8)
Alkaline Phosphatase: 89 IU/L (ref 44–121)
BUN/Creatinine Ratio: 16 (ref 9–23)
BUN: 12 mg/dL (ref 6–24)
Bilirubin Total: 0.2 mg/dL (ref 0.0–1.2)
CO2: 25 mmol/L (ref 20–29)
Calcium: 9.1 mg/dL (ref 8.7–10.2)
Chloride: 106 mmol/L (ref 96–106)
Creatinine, Ser: 0.74 mg/dL (ref 0.57–1.00)
Globulin, Total: 2.6 g/dL (ref 1.5–4.5)
Glucose: 91 mg/dL (ref 65–99)
Potassium: 4.3 mmol/L (ref 3.5–5.2)
Sodium: 144 mmol/L (ref 134–144)
Total Protein: 7 g/dL (ref 6.0–8.5)
eGFR: 99 mL/min/{1.73_m2} (ref >59)

## 2020-09-21 LAB — HEMOGLOBIN A1C
Est. average glucose Bld gHb Est-mCnc: 146 mg/dL
Hgb A1c MFr Bld: 6.7 % — ABNORMAL HIGH (ref 4.8–5.6)

## 2020-09-25 ENCOUNTER — Encounter: Payer: Self-pay | Admitting: Internal Medicine

## 2020-10-08 ENCOUNTER — Encounter: Payer: Self-pay | Admitting: Internal Medicine

## 2020-10-12 ENCOUNTER — Encounter: Payer: Self-pay | Admitting: Internal Medicine

## 2020-10-23 ENCOUNTER — Other Ambulatory Visit: Payer: Self-pay

## 2020-10-23 ENCOUNTER — Encounter: Payer: BC Managed Care – PPO | Attending: Student | Admitting: Skilled Nursing Facility1

## 2020-10-23 DIAGNOSIS — E669 Obesity, unspecified: Secondary | ICD-10-CM | POA: Insufficient documentation

## 2020-10-23 DIAGNOSIS — E119 Type 2 diabetes mellitus without complications: Secondary | ICD-10-CM | POA: Insufficient documentation

## 2020-10-23 NOTE — Progress Notes (Signed)
Bariatric Nutrition Follow-Up Visit Medical Nutrition Therapy  [facility]  NUTRITION ASSESSMENT    Anthropometrics  Start weight at [facility]: 275.7 lbs (date: 09/14/2020) Today's weight: 225.3 lbs  Body Composition Scale 09/14/2020 10/23/2020  Weight  lbs 228.9 225.3  Total Body Fat  % 44.2 43.8     Visceral Fat  15  Fat-Free Mass  %  56.1     Total Body Water  % 42.3 42.5     Muscle-Mass  lbs  30.2  BMI  39.5  Body Fat Displacement ---         Torso  lbs  61.2        Left Leg  lbs  12.2        Right Leg  lbs  12.2        Left Arm  lbs  6.1        Right Arm  lbs  6.1   Clinical  Medical hx: DM, HTN, hyperlipidemia Medications: see list Labs: A1C 6.7 (previous to having made dietary and activity changes) up from 6.4   Lifestyle & Dietary Hx  Pt states she got covid a few weeks ago but is feeling okay now.  Pt states she was able to cut back on snacking. Pt states she has started walking her dog 2 times a day 40 minutes total. Pt states she has been getting up more from her desk and taking mental breaks recognizing she is not as burn out by the end of the day when she gets home. Pt states she feels really good about the changes she has been able to make.  Pt states her mom will be staying with her for a while and is not concerned.  Pt state she will be starting a busy time at work in August.   Estimated daily fluid intake: 40-50 oz Estimated daily protein intake: 60 g Supplements: mutli and calcium  Current average weekly physical activity: going for walks  24-Hr Dietary Recall: eats out 2-3 times a week First Meal 11am: tuna + high fiber tortilla + bacon Snack:  Second Meal: salad or sandwich  Snack: pretzels Third Meal: homade pizza on high fiber tortilla + cheese + pepporoni Snack:  Beverages: coffee + sugar free creamer + monk fruit, water, water + flavorings, tea + honey + alternative sweetener, body armour lite  Post-Op Goals/ Signs/ Symptoms Using straws: no Drinking  while eating: no Chewing/swallowing difficulties: no Changes in vision: no Changes to mood/headaches: no Hair loss/changes to skin/nails: no Difficulty focusing/concentrating: no Sweating: no Dizziness/lightheadedness: no Palpitations: no Carbonated/caffeinated beverages: no N/V/D/C/Gas: no Abdominal pain: no Dumping syndrome: no    NUTRITION DIAGNOSIS  Overweight/obesity ([place]-3.3) related to past poor dietary habits and physical inactivity as evidenced by completed bariatric surgery and following dietary guidelines for continued weight loss and healthy nutrition status.     NUTRITION INTERVENTION Nutrition counseling (C-1) and education (E-2) to facilitate bariatric surgery goals, including: The importance of consuming adequate calories as well as certain nutrients daily due to the body's need for essential vitamins, minerals, and fats The importance of daily physical activity and to reach a goal of at least 150 minutes of moderate to vigorous physical activity weekly (or as directed by their physician) due to benefits such as increased musculature and improved lab values The importance of intuitive eating specifically learning hunger-satiety cues and understanding the importance of learning a new body: The importance of mindful eating to avoid grazing behaviors  Encouraged patient to honor their  body's internal hunger and fullness cues.  Throughout the day, check in mentally and rate hunger. Stop eating when satisfied not full regardless of how much food is left on the plate.  Get more if still hungry 20-30 minutes later.  The key is to honor satisfaction so throughout the meal, rate fullness factor and stop when comfortably satisfied not physically full. The key is to honor hunger and fullness without any feelings of guilt or shame.  Pay attention to what the internal cues are, rather than any external factors. This will enhance the confidence you have in listening to your own body and  following those internal cues enabling you to increase how often you eat when you are hungry not out of appetite and stop when you are satisfied not full.  Encouraged pt to continue to eat balanced meals inclusive of non starchy vegetables 2 times a day 7 days a week Encouraged pt to choose lean protein sources: limiting beef, pork, sausage, hotdogs, and lunch meat Encourage pt to choose healthy fats such as plant based limiting animal fats Encouraged pt to continue to drink a minium 64 fluid ounces with half being plain water to satisfy proper hydration  Goals:  -continue: Calm down for the evening by reading instead of snacking -continue: stop buying snack foods  -continue: take 5, 5 minute breaks throughout the work day or a couple 15 minute breaks  -continue: bring your water for those breaks to drink more water  -continue: join a group class at the gym (was not able to do but still plans on it) -continue: get back into your supplement regimen; 3 calcium a day all 2 hours apart and 2 hours from multivitamin  NEW: get in enough non starchy vegetables throughout the day   Handouts Previously Provided Include  Detailed MyPlate  Learning Style & Readiness for Change Teaching method utilized: Visual & Auditory  Demonstrated degree of understanding via: Teach Back   RD's Notes for Next Visit Assess adherence to pt chosen goals   MONITORING & EVALUATION Dietary intake, weekly physical activity, body weight  Next Steps Patient is to follow-up in 3 months

## 2020-10-27 ENCOUNTER — Other Ambulatory Visit: Payer: Self-pay | Admitting: Internal Medicine

## 2020-10-27 DIAGNOSIS — I10 Essential (primary) hypertension: Secondary | ICD-10-CM

## 2020-10-27 DIAGNOSIS — J301 Allergic rhinitis due to pollen: Secondary | ICD-10-CM

## 2020-11-09 ENCOUNTER — Ambulatory Visit: Payer: BC Managed Care – PPO | Admitting: Internal Medicine

## 2020-11-09 ENCOUNTER — Encounter: Payer: Self-pay | Admitting: Internal Medicine

## 2020-11-09 ENCOUNTER — Other Ambulatory Visit: Payer: Self-pay

## 2020-11-09 VITALS — BP 114/72 | HR 80 | Temp 98.6°F | Ht 63.0 in | Wt 225.8 lb

## 2020-11-09 DIAGNOSIS — E7800 Pure hypercholesterolemia, unspecified: Secondary | ICD-10-CM | POA: Diagnosis not present

## 2020-11-09 DIAGNOSIS — E1169 Type 2 diabetes mellitus with other specified complication: Secondary | ICD-10-CM | POA: Diagnosis not present

## 2020-11-09 MED ORDER — NOREL AD 4-10-325 MG PO TABS: 4.0000 mg | ORAL_TABLET | Freq: Two times a day (BID) | ORAL | 0 refills | Status: DC | PRN

## 2020-11-09 MED ORDER — OZEMPIC (0.25 OR 0.5 MG/DOSE) 2 MG/1.5ML ~~LOC~~ SOPN: 0.5000 mg | PEN_INJECTOR | SUBCUTANEOUS | 1 refills | Status: DC

## 2020-11-09 NOTE — Patient Instructions (Signed)
Exercising to Lose Weight Exercise is structured, repetitive physical activity to improve fitness and health. Getting regular exercise is important for everyone. It is especially important if you are overweight. Being overweight increases your risk of heart disease, stroke, diabetes, high blood pressure, and several types of cancer.Reducing your calorie intake and exercising can help you lose weight. Exercise is usually categorized as moderate or vigorous intensity. To lose weight, most people need to do a certain amount of moderate-intensity orvigorous-intensity exercise each week. Moderate-intensity exercise  Moderate-intensity exercise is any activity that gets you moving enough to burn at least three times more energy (calories) than if you were sitting. Examples of moderate exercise include: Walking a mile in 15 minutes. Doing light yard work. Biking at an easy pace. Most people should get at least 150 minutes (2 hours and 30 minutes) a week ofmoderate-intensity exercise to maintain their body weight. Vigorous-intensity exercise Vigorous-intensity exercise is any activity that gets you moving enough to burn at least six times more calories than if you were sitting. When you exercise at this intensity, you should be working hard enough that you are not able tocarry on a conversation. Examples of vigorous exercise include: Running. Playing a team sport, such as football, basketball, and soccer. Jumping rope. Most people should get at least 75 minutes (1 hour and 15 minutes) a week ofvigorous-intensity exercise to maintain their body weight. How can exercise affect me? When you exercise enough to burn more calories than you eat, you lose weight. Exercise also reduces body fat and builds muscle. The more muscle you have, the more calories you burn. Exercise also: Improves mood. Reduces stress and tension. Improves your overall fitness, flexibility, and endurance. Increases bone strength. The  amount of exercise you need to lose weight depends on: Your age. The type of exercise. Any health conditions you have. Your overall physical ability. Talk to your health care provider about how much exercise you need and whattypes of activities are safe for you. What actions can I take to lose weight? Nutrition  Make changes to your diet as told by your health care provider or diet and nutrition specialist (dietitian). This may include: Eating fewer calories. Eating more protein. Eating less unhealthy fats. Eating a diet that includes fresh fruits and vegetables, whole grains, low-fat dairy products, and lean protein. Avoiding foods with added fat, salt, and sugar. Drink plenty of water while you exercise to prevent dehydration or heat stroke.  Activity Choose an activity that you enjoy and set realistic goals. Your health care provider can help you make an exercise plan that works for you. Exercise at a moderate or vigorous intensity most days of the week. The intensity of exercise may vary from person to person. You can tell how intense a workout is for you by paying attention to your breathing and heartbeat. Most people will notice their breathing and heartbeat get faster with more intense exercise. Do resistance training twice each week, such as: Push-ups. Sit-ups. Lifting weights. Using resistance bands. Getting short amounts of exercise can be just as helpful as long structured periods of exercise. If you have trouble finding time to exercise, try to include exercise in your daily routine. Get up, stretch, and walk around every 30 minutes throughout the day. Go for a walk during your lunch break. Park your car farther away from your destination. If you take public transportation, get off one stop early and walk the rest of the way. Make phone calls while standing up and   walking around. Take the stairs instead of elevators or escalators. Wear comfortable clothes and shoes with  good support. Do not exercise so much that you hurt yourself, feel dizzy, or get very short of breath. Where to find more information [place] Department of Health and Human Services: www.hhs.gov Centers for Disease Control and Prevention (CDC): www.cdc.gov Contact a health care provider: Before starting a new exercise program. If you have questions or concerns about your weight. If you have a medical problem that keeps you from exercising. Get help right away if you have any of the following while exercising: Injury. Dizziness. Difficulty breathing or shortness of breath that does not go away when you stop exercising. Chest pain. Rapid heartbeat. Summary Being overweight increases your risk of heart disease, stroke, diabetes, high blood pressure, and several types of cancer. Losing weight happens when you burn more calories than you eat. Reducing the amount of calories you eat in addition to getting regular moderate or vigorous exercise each week helps you lose weight. This information is not intended to replace advice given to you by your health care provider. Make sure you discuss any questions you have with your healthcare provider. Document Revised: 07/12/2019 Document Reviewed: 07/29/2019 Elsevier Patient Education  2022 [employer]  

## 2020-11-09 NOTE — Progress Notes (Signed)
I,Yamilka Roman [employer] as a Neurosurgeon for [name], MD.,have documented all relevant documentation on the behalf of [name], MD,as directed by  [name], MD while in the presence of [name], MD.  This visit occurred during the SARS-CoV-2 public health emergency.  Safety protocols were in place, including screening questions prior to the visit, additional usage of staff PPE, and extensive cleaning of exam room while observing appropriate contact time as indicated for disinfecting solutions.  Subjective:     Patient ID: Sue Bell , female    DOB: 1970-04-27 , 50 y.o.   MRN: [id number]   Chief Complaint  Patient presents with   Diabetes   Obesity    HPI  Pt here today for a f/u Ozempic.  She was started on 0.25mg  weekly at her last visit. She has not had any issues with the medication.     Past Medical History:  Diagnosis Date   Diabetes mellitus without complication (HCC)    Hyperlipidemia    Hypertension    OSA (obstructive sleep apnea) 07/08/2016     Family History  Problem Relation Age of Onset   Heart disease Mother    Diabetes Father    Cancer Father    Breast cancer Sister      Current Outpatient Medications:    Calcium-Magnesium-Vitamin D (CALCIUM 500 PO), Take by mouth., Disp: , Rfl:    Lemborexant (DAYVIGO) 10 MG TABS, Take 10 mg by mouth at bedtime as needed., Disp: 30 tablet, Rfl: 2   levocetirizine (XYZAL) 5 MG tablet, TAKE 1 TABLET BY MOUTH EVERY EVENING, Disp: 90 tablet, Rfl: 2   mometasone (NASONEX) 50 MCG/ACT nasal spray, Place 1 spray into the nose daily., Disp: 17 g, Rfl: 3   Naftifine HCl 2 % GEL, Apply topically to affected area daily prn, Disp: 60 g, Rfl: 0   Olopatadine HCl (PATADAY) 0.2 % SOLN, Apply 1 drop to eye daily., Disp: 2.5 mL, Rfl: 0   Semaglutide,0.25 or 0.5MG /DOS, (OZEMPIC, 0.25 OR 0.5 MG/DOSE,) 2 MG/1.5ML SOPN, Inject 0.5 mg into the skin once a week., Disp: 1.5 mL, Rfl: 1   valsartan (DIOVAN) 160 MG  tablet, TAKE 1 TABLET(160 MG) BY MOUTH DAILY, Disp: 90 tablet, Rfl: 1   Chlorphen-PE-Acetaminophen (NOREL AD) 4-10-325 MG TABS, Take 4-10 mg by mouth 2 (two) times daily as needed., Disp: 20 tablet, Rfl: 0   Allergies  Allergen Reactions   Sulfa Antibiotics Hives     Review of Systems  Constitutional: Negative.   Respiratory: Negative.    Cardiovascular: Negative.   Gastrointestinal: Negative.   Neurological: Negative.   Psychiatric/Behavioral: Negative.      Today's Vitals   11/09/20 1623  BP: 114/72  Pulse: 80  Temp: 98.6 F (37 C)  TempSrc: Oral  Weight: 225 lb 12.8 oz (102.4 kg)  Height: 5\' 3"  (1.6 m)   Body mass index is 40 kg/m.  Wt Readings from Last 3 Encounters:  11/09/20 225 lb 12.8 oz (102.4 kg)  10/23/20 225 lb 4.8 oz (102.2 kg)  09/20/20 229 lb 12.8 oz (104.2 kg)    Objective:  Physical Exam Vitals and nursing note reviewed.  Constitutional:      Appearance: Normal appearance.  HENT:     Head: Normocephalic and atraumatic.     Nose:     Comments: Masked     Mouth/Throat:     Comments: Masked  Eyes:     Extraocular Movements: Extraocular movements intact.  Cardiovascular:  Rate and Rhythm: Normal rate and regular rhythm.     Heart sounds: Normal heart sounds.  Pulmonary:     Effort: Pulmonary effort is normal.     Breath sounds: Normal breath sounds.  Musculoskeletal:     Cervical back: Normal range of motion.  Skin:    General: Skin is warm.  Neurological:     General: No focal deficit present.     Mental Status: She is alert.  Psychiatric:        Mood and Affect: Mood normal.        Behavior: Behavior normal.        Assessment And Plan:     1. Type 2 diabetes mellitus with hypercholesterolemia (HCC) Comments: I will increase Ozempic to 0.5mg  weekly. She is reminded to stop eating when she feels full. She will f/u in 3 months for re-evaluation.   2. Class 2 severe obesity with body mass index (BMI) of 35 to 39.9 with serious  comorbidity (HCC) Comments: She has lost four pounds in six weeks. She is encouraged to incorporate more exercise into her daily routine.    Patient was given opportunity to ask questions. Patient verbalized understanding of the plan and was able to repeat key elements of the plan. All questions were answered to their satisfaction.   I, [name], MD, have reviewed all documentation for this visit. The documentation on 11/09/20 for the exam, diagnosis, procedures, and orders are all accurate and complete.   IF YOU HAVE BEEN REFERRED TO A SPECIALIST, IT MAY TAKE 1-2 WEEKS TO SCHEDULE/PROCESS THE REFERRAL. IF YOU HAVE NOT HEARD FROM US/SPECIALIST IN TWO WEEKS, PLEASE GIVE [place] A CALL AT [phone].   THE PATIENT IS ENCOURAGED TO PRACTICE SOCIAL DISTANCING DUE TO THE COVID-19 PANDEMIC.

## 2020-12-04 ENCOUNTER — Other Ambulatory Visit: Payer: Self-pay | Admitting: Internal Medicine

## 2020-12-04 DIAGNOSIS — Z1231 Encounter for screening mammogram for malignant neoplasm of breast: Secondary | ICD-10-CM

## 2020-12-04 LAB — HM DIABETES EYE EXAM

## 2020-12-05 ENCOUNTER — Encounter: Payer: Self-pay | Admitting: Internal Medicine

## 2020-12-06 ENCOUNTER — Ambulatory Visit: Payer: BC Managed Care – PPO

## 2020-12-21 ENCOUNTER — Ambulatory Visit
Admission: RE | Admit: 2020-12-21 | Discharge: 2020-12-21 | Disposition: A | Discharge status: Home / Self Care | Payer: BC Managed Care – PPO | Source: Ambulatory Visit | Attending: Internal Medicine | Admitting: Internal Medicine

## 2020-12-21 ENCOUNTER — Other Ambulatory Visit: Payer: Self-pay

## 2020-12-21 DIAGNOSIS — Z1231 Encounter for screening mammogram for malignant neoplasm of breast: Secondary | ICD-10-CM

## 2021-01-08 ENCOUNTER — Encounter: Payer: Self-pay | Admitting: Internal Medicine

## 2021-01-08 ENCOUNTER — Other Ambulatory Visit: Payer: Self-pay | Admitting: Internal Medicine

## 2021-01-08 DIAGNOSIS — F5101 Primary insomnia: Secondary | ICD-10-CM

## 2021-01-08 MED ORDER — DAYVIGO 10 MG PO TABS: 10.0000 mg | ORAL_TABLET | Freq: Every evening | ORAL | 2 refills | Status: DC | PRN

## 2021-01-22 ENCOUNTER — Encounter: Payer: BC Managed Care – PPO | Admitting: Skilled Nursing Facility1

## 2021-01-23 ENCOUNTER — Encounter: Payer: BC Managed Care – PPO | Admitting: Internal Medicine

## 2021-02-28 ENCOUNTER — Encounter: Payer: BC Managed Care – PPO | Admitting: Nurse Practitioner

## 2021-03-14 ENCOUNTER — Other Ambulatory Visit: Payer: Self-pay | Admitting: Internal Medicine

## 2021-03-22 ENCOUNTER — Other Ambulatory Visit: Payer: Self-pay

## 2021-03-22 ENCOUNTER — Ambulatory Visit: Payer: BC Managed Care – PPO | Admitting: Internal Medicine

## 2021-03-22 ENCOUNTER — Encounter: Payer: Self-pay | Admitting: Internal Medicine

## 2021-03-22 VITALS — BP 114/72 | HR 96 | Temp 98.4°F | Ht 63.0 in | Wt 221.2 lb

## 2021-03-22 DIAGNOSIS — Z23 Encounter for immunization: Secondary | ICD-10-CM | POA: Diagnosis not present

## 2021-03-22 DIAGNOSIS — E1169 Type 2 diabetes mellitus with other specified complication: Secondary | ICD-10-CM | POA: Diagnosis not present

## 2021-03-22 DIAGNOSIS — I10 Essential (primary) hypertension: Secondary | ICD-10-CM

## 2021-03-22 DIAGNOSIS — E7800 Pure hypercholesterolemia, unspecified: Secondary | ICD-10-CM

## 2021-03-22 MED ORDER — MOUNJARO 5 MG/0.5ML ~~LOC~~ SOAJ: 5.0000 mg | SUBCUTANEOUS | 0 refills | Status: DC

## 2021-03-22 NOTE — Patient Instructions (Signed)

## 2021-03-22 NOTE — Progress Notes (Signed)
I,Katawbba Wiggins,acting as a Education administrator for Maximino Greenland, MD.,have documented all relevant documentation on the behalf of Maximino Greenland, MD,as directed by  Maximino Greenland, MD while in the presence of Maximino Greenland, MD.  This visit occurred during the SARS-CoV-2 public health emergency.  Safety protocols were in place, including screening questions prior to the visit, additional usage of staff PPE, and extensive cleaning of exam room while observing appropriate contact time as indicated for disinfecting solutions.  Subjective:     Patient ID: Sue Bell , female    DOB: 1971/03/29 , 50 y.o.   MRN: [id number]   Chief Complaint  Patient presents with   Diabetes    HPI  Patient presents today for dm and bp check.  She reports compliance with meds. Wants to switch to [facility]. She has read about medication and is interested in its weight loss benefits. Denies family history of thyroid cancer.   Diabetes She presents for her follow-up diabetic visit. She has type 2 diabetes mellitus. There are no hypoglycemic associated symptoms. There are no diabetic associated symptoms. Pertinent negatives for diabetes include no blurred vision and no chest pain. There are no hypoglycemic complications. Risk factors for coronary artery disease include diabetes mellitus, obesity and sedentary lifestyle. She is compliant with treatment most of the time. She participates in exercise intermittently. An ACE inhibitor/angiotensin II receptor blocker is not being taken.  Hypertension This is a chronic problem. The current episode started more than 1 year ago. The problem has been gradually improving since onset. The problem is controlled. Pertinent negatives include no blurred vision, chest pain, palpitations or shortness of breath. Past treatments include angiotensin blockers. The current treatment provides moderate improvement. Compliance problems include exercise.     Past Medical History:  Diagnosis Date    Diabetes mellitus without complication (HCC)    Hyperlipidemia    Hypertension    OSA (obstructive sleep apnea) 07/08/2016     Family History  Problem Relation Age of Onset   Heart disease Mother    Diabetes Father    Cancer Father    Breast cancer Sister      Current Outpatient Medications:    Calcium-Magnesium-Vitamin D (CALCIUM 500 PO), Take by mouth., Disp: , Rfl:    Chlorphen-PE-Acetaminophen (NOREL AD) 4-10-325 MG TABS, Take 4-10 mg by mouth 2 (two) times daily as needed., Disp: 20 tablet, Rfl: 0   Lemborexant (DAYVIGO) 10 MG TABS, Take 10 mg by mouth at bedtime as needed., Disp: 30 tablet, Rfl: 2   levocetirizine (XYZAL) 5 MG tablet, TAKE 1 TABLET BY MOUTH EVERY EVENING, Disp: 90 tablet, Rfl: 2   mometasone (NASONEX) 50 MCG/ACT nasal spray, Place 1 spray into the nose daily., Disp: 17 g, Rfl: 3   tirzepatide (MOUNJARO) 5 MG/0.5ML Pen, Inject 5 mg into the skin once a week., Disp: 2 mL, Rfl: 0   valsartan (DIOVAN) 160 MG tablet, TAKE 1 TABLET(160 MG) BY MOUTH DAILY, Disp: 90 tablet, Rfl: 1   Naftifine HCl 2 % GEL, Apply topically to affected area daily prn (Patient not taking: Reported on 03/22/2021), Disp: 60 g, Rfl: 0   Olopatadine HCl (PATADAY) 0.2 % SOLN, Apply 1 drop to eye daily. (Patient not taking: Reported on 03/22/2021), Disp: 2.5 mL, Rfl: 0   Allergies  Allergen Reactions   Sulfa Antibiotics Hives     Review of Systems  Constitutional: Negative.   Eyes:  Negative for blurred vision.  Respiratory: Negative.  Negative for shortness of  breath.   Cardiovascular: Negative.  Negative for chest pain and palpitations.  Neurological: Negative.   Psychiatric/Behavioral: Negative.      Today's Vitals   03/22/21 1207  BP: 114/72  Pulse: 96  Temp: 98.4 F (36.9 C)  Weight: 221 lb 3.2 oz (100.3 kg)  Height: '5\' 3"'  (1.6 m)   Body mass index is 39.18 kg/m.  Wt Readings from Last 3 Encounters:  03/22/21 221 lb 3.2 oz (100.3 kg)  11/09/20 225 lb 12.8 oz (102.4 kg)   10/23/20 225 lb 4.8 oz (102.2 kg)    BP Readings from Last 3 Encounters:  03/22/21 114/72  11/09/20 114/72  09/20/20 124/70    Objective:  Physical Exam Vitals and nursing note reviewed.  Constitutional:      Appearance: Normal appearance. She is obese.  HENT:     Head: Normocephalic and atraumatic.     Nose:     Comments: Masked     Mouth/Throat:     Comments: Masked  Eyes:     Extraocular Movements: Extraocular movements intact.  Cardiovascular:     Rate and Rhythm: Normal rate and regular rhythm.     Heart sounds: Normal heart sounds.  Pulmonary:     Effort: Pulmonary effort is normal.     Breath sounds: Normal breath sounds.  Musculoskeletal:     Cervical back: Normal range of motion.  Skin:    General: Skin is warm.  Neurological:     General: No focal deficit present.     Mental Status: She is alert.  Psychiatric:        Mood and Affect: Mood normal.        Behavior: Behavior normal.        Assessment And Plan:     1. Type 2 diabetes mellitus with hypercholesterolemia (HCC) Comments: Chronic, controlled. We discussed use of Mounjaro, denies family h/o thyroid cancer. She was instructed on how to self administer the medication. Possible side effects were discussed with the patient. She will start with 2.15m weekly x 4 weeks, then 568mweekly. She is s/p hysterectomy, so possible interference with contraception is not an issue. She will f/u in 6 weeks for re-evaluation.  - Hemoglobin A1c  2. Essential hypertension, benign Comments: Chronic, well controlled. Encouraged to follow low sodium diet.  No med changes today.  - CMP14+EGFR  3. Immunization due - Flu Vaccine QUAD 6+ mos PF IM (Fluarix Quad PF)    Patient was given opportunity to ask questions. Patient verbalized understanding of the plan and was able to repeat key elements of the plan. All questions were answered to their satisfaction.   I, RoMaximino GreenlandMD, have reviewed all documentation for  this visit. The documentation on 03/22/21 for the exam, diagnosis, procedures, and orders are all accurate and complete.   IF YOU HAVE BEEN REFERRED TO A SPECIALIST, IT MAY TAKE 1-2 WEEKS TO SCHEDULE/PROCESS THE REFERRAL. IF YOU HAVE NOT HEARD FROM US/SPECIALIST IN TWO WEEKS, PLEASE GIVE [place] CALL AT [phone].   THE PATIENT IS ENCOURAGED TO PRACTICE SOCIAL DISTANCING DUE TO THE COVID-19 PANDEMIC.

## 2021-03-23 LAB — CMP14+EGFR
ALT: 15 IU/L (ref 0–32)
AST: 18 IU/L (ref 0–40)
Albumin/Globulin Ratio: 1.9 (ref 1.2–2.2)
Albumin: 4.3 g/dL (ref 3.8–4.9)
Alkaline Phosphatase: 83 IU/L (ref 44–121)
BUN/Creatinine Ratio: 16 (ref 9–23)
BUN: 14 mg/dL (ref 6–24)
Bilirubin Total: <0.2 mg/dL (ref 0.0–1.2)
CO2: 23 mmol/L (ref 20–29)
Calcium: 9 mg/dL (ref 8.7–10.2)
Chloride: 108 mmol/L — ABNORMAL HIGH (ref 96–106)
Creatinine, Ser: 0.9 mg/dL (ref 0.57–1.00)
Globulin, Total: 2.3 g/dL (ref 1.5–4.5)
Glucose: 93 mg/dL (ref 70–99)
Potassium: 4.1 mmol/L (ref 3.5–5.2)
Sodium: 146 mmol/L — ABNORMAL HIGH (ref 134–144)
Total Protein: 6.6 g/dL (ref 6.0–8.5)
eGFR: 77 mL/min/{1.73_m2} (ref >59)

## 2021-03-23 LAB — HEMOGLOBIN A1C
Est. average glucose Bld gHb Est-mCnc: 128 mg/dL
Hgb A1c MFr Bld: 6.1 % — ABNORMAL HIGH (ref 4.8–5.6)

## 2021-03-26 ENCOUNTER — Encounter: Payer: Self-pay | Admitting: Internal Medicine

## 2021-04-03 ENCOUNTER — Ambulatory Visit: Payer: BC Managed Care – PPO | Admitting: Internal Medicine

## 2021-04-12 ENCOUNTER — Other Ambulatory Visit (HOSPITAL_COMMUNITY): Payer: Self-pay

## 2021-04-21 ENCOUNTER — Other Ambulatory Visit: Payer: Self-pay | Admitting: Internal Medicine

## 2021-05-01 ENCOUNTER — Other Ambulatory Visit: Payer: Self-pay | Admitting: Internal Medicine

## 2021-05-01 DIAGNOSIS — I10 Essential (primary) hypertension: Secondary | ICD-10-CM

## 2021-05-02 ENCOUNTER — Ambulatory Visit: Payer: BC Managed Care – PPO | Admitting: Internal Medicine

## 2021-05-02 ENCOUNTER — Encounter: Payer: Self-pay | Admitting: Internal Medicine

## 2021-05-11 ENCOUNTER — Other Ambulatory Visit: Payer: Self-pay | Admitting: Internal Medicine

## 2021-05-11 DIAGNOSIS — F5101 Primary insomnia: Secondary | ICD-10-CM

## 2021-05-15 ENCOUNTER — Telehealth: Payer: Self-pay

## 2021-05-15 NOTE — Telephone Encounter (Signed)
Pa completed for dayvigo

## 2021-05-16 ENCOUNTER — Telehealth: Payer: Self-pay

## 2021-05-16 NOTE — Telephone Encounter (Signed)
Patient notified that her prior auth for Donzetta Kohut was approved through 05/15/2024. YL,RMA

## 2021-05-20 ENCOUNTER — Other Ambulatory Visit: Payer: Self-pay | Admitting: Internal Medicine

## 2021-05-24 ENCOUNTER — Encounter: Payer: Self-pay | Admitting: Internal Medicine

## 2021-05-24 ENCOUNTER — Ambulatory Visit: Payer: BC Managed Care – PPO | Admitting: Internal Medicine

## 2021-05-24 ENCOUNTER — Other Ambulatory Visit: Payer: Self-pay

## 2021-05-24 VITALS — BP 120/82 | HR 86 | Temp 98.2°F | Ht 63.0 in | Wt 218.8 lb

## 2021-05-24 DIAGNOSIS — E1169 Type 2 diabetes mellitus with other specified complication: Secondary | ICD-10-CM

## 2021-05-24 DIAGNOSIS — L304 Erythema intertrigo: Secondary | ICD-10-CM | POA: Diagnosis not present

## 2021-05-24 DIAGNOSIS — E7800 Pure hypercholesterolemia, unspecified: Secondary | ICD-10-CM

## 2021-05-24 MED ORDER — NYSTATIN 100000 UNIT/GM EX POWD: 1.0000 "application " | Freq: Three times a day (TID) | CUTANEOUS | 0 refills | Status: DC

## 2021-05-24 MED ORDER — MOUNJARO 10 MG/0.5ML ~~LOC~~ SOAJ: 10.0000 mg | SUBCUTANEOUS | 3 refills | Status: DC

## 2021-05-24 NOTE — Progress Notes (Signed)
[name],acting as a Neurosurgeon for Sue Aliment, MD.,have documented all relevant documentation on the behalf of Sue Aliment, MD,as directed by  Sue Aliment, MD while in the presence of Sue Aliment, MD.  This visit occurred during the SARS-CoV-2 public health emergency.  Safety protocols were in place, including screening questions prior to the visit, additional usage of staff PPE, and extensive cleaning of exam room while observing appropriate contact time as indicated for disinfecting solutions.  Subjective:     Patient ID: Sue Bell , female    DOB: Jul 02, 1970 , 51 y.o.   MRN: [id number]   Chief Complaint  Patient presents with   Weight Check    HPI  Patient presents today for a weight check. Patient would like you to look at her belly button she thinks it may be infected.     Past Medical History:  Diagnosis Date   Diabetes mellitus without complication (HCC)    Hyperlipidemia    Hypertension    OSA (obstructive sleep apnea) 07/08/2016     Family History  Problem Relation Age of Onset   Heart disease Mother    Diabetes Father    Cancer Father    Breast cancer Sister      Current Outpatient Medications:    Calcium-Magnesium-Vitamin D (CALCIUM 500 PO), Take by mouth., Disp: , Rfl:    Chlorphen-PE-Acetaminophen (NOREL AD) 4-10-325 MG TABS, Take 4-10 mg by mouth 2 (two) times daily as needed., Disp: 20 tablet, Rfl: 0   DAYVIGO 10 MG TABS, TAKE 1 TABLET BY MOUTH AT BEDTIME AS NEEDED, Disp: 30 tablet, Rfl: 2   levocetirizine (XYZAL) 5 MG tablet, TAKE 1 TABLET BY MOUTH EVERY EVENING, Disp: 90 tablet, Rfl: 2   mometasone (NASONEX) 50 MCG/ACT nasal spray, Place 1 spray into the nose daily., Disp: 17 g, Rfl: 3   MOUNJARO 5 MG/0.5ML Pen, INJECT 5 MG UNDER SKIN ONCE A WEEK, Disp: 2 mL, Rfl: 0   Naftifine HCl 2 % GEL, Apply topically to affected area daily prn, Disp: 60 g, Rfl: 0   valsartan (DIOVAN) 160 MG tablet, TAKE 1 TABLET(160 MG) BY MOUTH DAILY,  Disp: 90 tablet, Rfl: 1   Allergies  Allergen Reactions   Sulfa Antibiotics Hives     Review of Systems   Today's Vitals   05/24/21 1037  BP: 120/82  Pulse: 86  Temp: 98.2 F (36.8 C)  Weight: 218 lb 12.8 oz (99.2 kg)  Height: 5\' 3"  (1.6 m)  PainSc: 0-No pain   Body mass index is 38.76 kg/m.  Wt Readings from Last 3 Encounters:  05/24/21 218 lb 12.8 oz (99.2 kg)  03/22/21 221 lb 3.2 oz (100.3 kg)  11/09/20 225 lb 12.8 oz (102.4 kg)     Objective:  Physical Exam      Assessment And Plan:     1. Class 2 severe obesity with body mass index (BMI) of 35 to 39.9 with serious comorbidity [facility])     Patient was given opportunity to ask questions. Patient verbalized understanding of the plan and was able to repeat key elements of the plan. All questions were answered to their satisfaction.  [facility] [place], CMA   I, Patrecia Pour [place], CMA, have reviewed all documentation for this visit. The documentation on 05/24/21 for the exam, diagnosis, procedures, and orders are all accurate and complete.   IF YOU HAVE BEEN REFERRED TO A SPECIALIST, IT MAY TAKE 1-2 WEEKS TO SCHEDULE/PROCESS THE REFERRAL. IF  YOU HAVE NOT HEARD FROM US/SPECIALIST IN TWO WEEKS, PLEASE GIVE [place] A CALL AT [phone].   THE PATIENT IS ENCOURAGED TO PRACTICE SOCIAL DISTANCING DUE TO THE COVID-19 PANDEMIC.

## 2021-05-24 NOTE — Progress Notes (Signed)
This visit occurred during the SARS-CoV-2 public health emergency.  Safety protocols were in place, including screening questions prior to the visit, additional usage of staff PPE, and extensive cleaning of exam room while observing appropriate contact time as indicated for disinfecting solutions.  Subjective:     Patient ID: Sue Bell , female    DOB: [birth year] , 51 y.o.   MRN: [id number]   Chief Complaint  Patient presents with   Weight Check    HPI  She is here today for DM f/u. She was switched to [facility] from [facility]. She has not had any issues with the medication.     Past Medical History:  Diagnosis Date   Diabetes mellitus without complication (HCC)    Hyperlipidemia    Hypertension    OSA (obstructive sleep apnea) 07/08/2016     Family History  Problem Relation Age of Onset   Heart disease Mother    Diabetes Father    Cancer Father    Breast cancer Sister      Current Outpatient Medications:    Calcium-Magnesium-Vitamin D (CALCIUM 500 PO), Take by mouth., Disp: , Rfl:    Chlorphen-PE-Acetaminophen (NOREL AD) 4-10-325 MG TABS, Take 4-10 mg by mouth 2 (two) times daily as needed., Disp: 20 tablet, Rfl: 0   DAYVIGO 10 MG TABS, TAKE 1 TABLET BY MOUTH AT BEDTIME AS NEEDED, Disp: 30 tablet, Rfl: 2   levocetirizine (XYZAL) 5 MG tablet, TAKE 1 TABLET BY MOUTH EVERY EVENING, Disp: 90 tablet, Rfl: 2   mometasone (NASONEX) 50 MCG/ACT nasal spray, Place 1 spray into the nose daily., Disp: 17 g, Rfl: 3   Naftifine HCl 2 % GEL, Apply topically to affected area daily prn, Disp: 60 g, Rfl: 0   nystatin powder, Apply 1 application topically 3 (three) times daily. prn, Disp: 30 g, Rfl: 0   tirzepatide (MOUNJARO) 10 MG/0.5ML Pen, Inject 10 mg into the skin once a week., Disp: 0.5 mL, Rfl: 3   valsartan (DIOVAN) 160 MG tablet, TAKE 1 TABLET(160 MG) BY MOUTH DAILY, Disp: 90 tablet, Rfl: 1   Allergies  Allergen Reactions   Sulfa Antibiotics Hives     Review of Systems   Constitutional: Negative.   Respiratory: Negative.    Cardiovascular: Negative.   Gastrointestinal: Negative.   Skin:        She has rash in belly button. No drainage. Itchy.   Neurological: Negative.   Psychiatric/Behavioral: Negative.      Today's Vitals   05/24/21 1037  BP: 120/82  Pulse: 86  Temp: 98.2 F (36.8 C)  Weight: 218 lb 12.8 oz (99.2 kg)  Height: 5\' 3"  (1.6 m)  PainSc: 0-No pain   Body mass index is 38.76 kg/m.   Wt Readings from Last 3 Encounters:  05/24/21 218 lb 12.8 oz (99.2 kg)  03/22/21 221 lb 3.2 oz (100.3 kg)  11/09/20 225 lb 12.8 oz (102.4 kg)     Objective:  Physical Exam Vitals and nursing note reviewed.  Constitutional:      Appearance: Normal appearance.  HENT:     Head: Normocephalic and atraumatic.     Nose:     Comments: Masked     Mouth/Throat:     Comments: Masked  Eyes:     Extraocular Movements: Extraocular movements intact.  Cardiovascular:     Rate and Rhythm: Normal rate and regular rhythm.     Heart sounds: Normal heart sounds.  Pulmonary:     Effort: Pulmonary effort is normal.  Breath sounds: Normal breath sounds.  Musculoskeletal:     Cervical back: Normal range of motion.  Skin:    General: Skin is warm.     Comments: No rashes noted  Neurological:     General: No focal deficit present.     Mental Status: She is alert.  Psychiatric:        Mood and Affect: Mood normal.        Behavior: Behavior normal.        Assessment And Plan:     1. Type 2 diabetes mellitus with hypercholesterolemia (HCC) Comments: Chronic, last a1c 6.1 in Dec 2022. She is due in April 2023. She just refilled 5mg  and has 2.5mg  samples, she wil increase to 7.5mg  weekly. F/u 8 wks.   2. Intertrigo Comments: She was given rx nystatin powder to apply to affected area BID. She will let me know if her sx persist.   3. Class 2 severe obesity with body mass index (BMI) of 35 to 39.9 with serious comorbidity (HCC) Comments: BMI 38.  She is  encouraged to aim for at least 150 minutes of exercise per week.    Patient was given opportunity to ask questions. Patient verbalized understanding of the plan and was able to repeat key elements of the plan. All questions were answered to their satisfaction.   I, , MD, have reviewed all documentation for this visit. The documentation on 05/24/21 for the exam, diagnosis, procedures, and orders are all accurate and complete.   IF YOU HAVE BEEN REFERRED TO A SPECIALIST, IT MAY TAKE 1-2 WEEKS TO SCHEDULE/PROCESS THE REFERRAL. IF YOU HAVE NOT HEARD FROM US/SPECIALIST IN TWO WEEKS, PLEASE GIVE 07/22/21 A CALL AT [phone].   THE PATIENT IS ENCOURAGED TO PRACTICE SOCIAL DISTANCING DUE TO THE COVID-19 PANDEMIC.

## 2021-05-24 NOTE — Patient Instructions (Signed)

## 2021-06-13 ENCOUNTER — Encounter: Payer: Self-pay | Admitting: Internal Medicine

## 2021-06-13 ENCOUNTER — Other Ambulatory Visit: Payer: Self-pay

## 2021-06-13 MED ORDER — NOREL AD 4-10-325 MG PO TABS: 4.0000 mg | ORAL_TABLET | Freq: Two times a day (BID) | ORAL | 0 refills | Status: DC | PRN

## 2021-06-14 ENCOUNTER — Encounter: Payer: Self-pay | Admitting: Internal Medicine

## 2021-06-14 ENCOUNTER — Telehealth (INDEPENDENT_AMBULATORY_CARE_PROVIDER_SITE_OTHER): Payer: BC Managed Care – PPO | Admitting: Internal Medicine

## 2021-06-14 DIAGNOSIS — U071 COVID-19: Secondary | ICD-10-CM

## 2021-06-14 MED ORDER — HYDROCODONE BIT-HOMATROP MBR 5-1.5 MG/5ML PO SOLN: 5.0000 mL | Freq: Four times a day (QID) | ORAL | 0 refills | Status: DC | PRN

## 2021-06-14 MED ORDER — NIRMATRELVIR/RITONAVIR (PAXLOVID)TABLET: 3.0000 | ORAL_TABLET | Freq: Two times a day (BID) | ORAL | 0 refills | Status: AC

## 2021-06-14 NOTE — Progress Notes (Signed)
? ?Virtual Visit via Video  ? ?This visit type was conducted due to national recommendations for restrictions regarding the COVID-19 Pandemic (e.g. social distancing) in an effort to limit this patient's exposure and mitigate transmission in our community.  Due to her co-morbid illnesses, this patient is at least at moderate risk for complications without adequate follow up.  This format is felt to be most appropriate for this patient at this time.  All issues noted in this document were discussed and addressed.  A limited physical exam was performed with this format.   ? ?This visit type was conducted due to national recommendations for restrictions regarding the COVID-19 Pandemic (e.g. social distancing) in an effort to limit this patient's exposure and mitigate transmission in our community.  Patients identity confirmed using two different identifiers.  This format is felt to be most appropriate for this patient at this time.  All issues noted in this document were discussed and addressed.  No physical exam was performed (except for noted visual exam findings with Video Visits).   ? ?Date:  06/14/2021  ? ?ID:  Sue Bell, DOB [birth year], MRN [id number] ? ?Patient Location:  ?Work, in her private office ? ?Provider location:   ?Office ? ?Chief Complaint:  "I have COVID" ? ?History of Present Illness:   ? ?Sue Bell is a 51 y.o. female who presents via video conferencing for a telehealth visit today.   ? ?The patient does have symptoms concerning for COVID-19 infection (fever, chills, cough, or new shortness of breath).  ? ?She presents today for virtual visit. She prefers this method of contact due to COVID-19 pandemic.  She presents today for treatment of COVID. She states she returned from a cruise on 2/26 and developed a sore throat on Monday. She states her sx worsened on Tuesday. She denies fever, but had chills and fatigue. On Wednesday she developed sinus congestion, she went to [facility] on [employer] [facility] and had PCR COVID testing. Her initial rapid test was negative. She was advised today that her PCR was weakly positive. She was not prescribed any treatment. She will go home as soon as this visit is over.  ?  ? ?Past Medical History:  ?Diagnosis Date  ? Diabetes mellitus without complication (HCC)   ? Hyperlipidemia   ? Hypertension   ? OSA (obstructive sleep apnea) 07/08/2016  ? ?Past Surgical History:  ?Procedure Laterality Date  ? BREAST REDUCTION SURGERY  2003  ? CESAREAN SECTION    ? GASTRIC ROUX-EN-Y N/A 10/15/2016  ? Procedure: LAPAROSCOPIC ROUX-EN-Y GASTRIC BYPASS WITH UPPER ENDOSCOPY;  Surgeon: Berna Bue, MD;  Location: [facility];  Service: General;  Laterality: N/A;  ? PARTIAL HYSTERECTOMY  2005  ? REDUCTION MAMMAPLASTY Bilateral 2003  ?  ? ?Current Meds  ?Medication Sig  ? Calcium-Magnesium-Vitamin D (CALCIUM 500 PO) Take by mouth.  ? Chlorphen-PE-Acetaminophen (NOREL AD) 4-10-325 MG TABS Take 4-10 mg by mouth 2 (two) times daily as needed.  ? DAYVIGO 10 MG TABS TAKE 1 TABLET BY MOUTH AT BEDTIME AS NEEDED  ? HYDROcodone bit-homatropine (HYDROMET) 5-1.5 MG/5ML syrup Take 5 mLs by mouth every 6 (six) hours as needed for cough.  ? levocetirizine (XYZAL) 5 MG tablet TAKE 1 TABLET BY MOUTH EVERY EVENING  ? mometasone (NASONEX) 50 MCG/ACT nasal spray Place 1 spray into the nose daily.  ? Naftifine HCl 2 % GEL Apply topically to affected area daily prn  ? nirmatrelvir/ritonavir EUA (PAXLOVID) 20 x 150 MG &  10 x 100MG  TABS Take 3 tablets by mouth 2 (two) times daily for 5 days. Patient GFR is 77 (Dec 2022) Take nirmatrelvir (150 mg) two tablets twice daily for 5 days and ritonavir (100 mg) one tablet twice daily for 5 days.  ? nystatin powder Apply 1 application topically 3 (three) times daily. prn  ? tirzepatide (MOUNJARO) 10 MG/0.5ML Pen Inject 10 mg into the skin once a week.  ? valsartan (DIOVAN) 160 MG tablet TAKE 1 TABLET(160 MG) BY MOUTH DAILY  ?  ? ?Allergies:   Sulfa antibiotics   ? ?Social History  ? ?Tobacco Use  ? Smoking status: Never  ? Smokeless tobacco: Never  ?Vaping Use  ? Vaping Use: Never used  ?Substance Use Topics  ? Alcohol use: No  ? Drug use: No  ?  ? ?Family Hx: ?The patient's family history includes Breast cancer in her sister; Cancer in her father; Diabetes in her father; Heart disease in her mother. ? ?ROS:   ?Please see the history of present illness.    ?Review of Systems  ?Constitutional:  Positive for chills and malaise/fatigue.  ?HENT:  Positive for congestion and sore throat.   ?Respiratory: Negative.    ?Cardiovascular: Negative.   ?Gastrointestinal: Negative.   ?Neurological: Negative.   ?Psychiatric/Behavioral: Negative.     ?All other systems reviewed and are negative. ? ? ?Labs/Other Tests and Data Reviewed:   ? ?Recent Labs: ?03/22/2021: ALT 15; BUN 14; Creatinine, Ser 0.90; Potassium 4.1; Sodium 146  ? ?Recent Lipid Panel ?Lab Results  ?Component Value Date/Time  ? CHOL 268 (H) 01/20/2020 03:58 PM  ? TRIG 134 01/20/2020 03:58 PM  ? HDL 68 01/20/2020 03:58 PM  ? CHOLHDL 3.9 01/20/2020 03:58 PM  ? LDLCALC 176 (H) 01/20/2020 03:58 PM  ? ? ?Wt Readings from Last 3 Encounters:  ?05/24/21 218 lb 12.8 oz (99.2 kg)  ?03/22/21 221 lb 3.2 oz (100.3 kg)  ?11/09/20 225 lb 12.8 oz (102.4 kg)  ?  ? ?Exam:   ? ?Vital Signs:  There were no vitals taken for this visit.  ? ? ?Physical Exam ?Vitals and nursing note reviewed.  ?HENT:  ?   Head: Normocephalic and atraumatic.  ?   Comments: Sounds congested ?Eyes:  ?   Extraocular Movements: Extraocular movements intact.  ?Pulmonary:  ?   Effort: Pulmonary effort is normal.  ?Musculoskeletal:  ?   Cervical back: Normal range of motion.  ?Neurological:  ?   Mental Status: She is alert and oriented to person, place, and time.  ?Psychiatric:     ?   Mood and Affect: Affect normal.  ? ? ?ASSESSMENT & PLAN:   ? ? 1. COVID-19 ?I will send rx hycodan syrup to use prn and paxlovid. Recent GFR results reviewed. She was advised of possible  side effects. Advised patient to take Vitamin C, D, Zinc.  Keep yourself hydrated with a lot of water and rest. Take Delsym for cough and Mucinex as needed. Take Tylenol or pain reliever every 4-6 hours as needed for pain/fever/body ache. If you have elevated blood pressure, you can take OTC Coricidin. You can also take OTC Oscillococcinum, a homeopathic remedy,  to help with your symptoms.  Educated patient that if symptoms get worse or if he/she experiences any SOB, chest pain or pain in their legs to seek immediate emergency care. Continue to monitor your oxygen levels. Call office ASAP if you have any questions. Quarantine for 5 days if tested positive and no symptoms  or 10 days if tested positive and you are with symptoms. Wear a mask around other people.  ? ? COVID-19 Education: ?The signs and symptoms of COVID-19 were discussed with the patient and how to seek care for testing (follow up with PCP or arrange E-visit).  The importance of social distancing was discussed today. ? ?Patient Risk:   ?After full review of this patients clinical status, I feel that they are at least moderate risk at this time. ? ?Time:   ?Today, I have spent 13 minutes/ seconds with the patient with telehealth technology discussing above diagnoses.   ? ? ?Medication Adjustments/Labs and Tests Ordered: ?Current medicines are reviewed at length with the patient today.  Concerns regarding medicines are outlined above.  ? ?Tests Ordered: ?No orders of the defined types were placed in this encounter. ? ? ?Medication Changes: ?Meds ordered this encounter  ?Medications  ? HYDROcodone bit-homatropine (HYDROMET) 5-1.5 MG/5ML syrup  ?  Sig: Take 5 mLs by mouth every 6 (six) hours as needed for cough.  ?  Dispense:  120 mL  ?  Refill:  0  ? nirmatrelvir/ritonavir EUA (PAXLOVID) 20 x 150 MG & 10 x 100MG  TABS  ?  Sig: Take 3 tablets by mouth 2 (two) times daily for 5 days. Patient GFR is 77 (Dec 2022) Take nirmatrelvir (150 mg) two tablets twice  daily for 5 days and ritonavir (100 mg) one tablet twice daily for 5 days.  ?  Dispense:  30 tablet  ?  Refill:  0  ? ? ?Disposition:  Follow up prn ? ?Signed, ?07-28-1975, MD  ?  ?

## 2021-06-14 NOTE — Patient Instructions (Signed)
COVID-19: Quarantine and Isolation °Quarantine °If you were exposed °Quarantine and stay away from others when you have been in close contact with someone who has COVID-19. °Isolate °If you are sick or test positive °Isolate when you are sick or when you have COVID-19, even if you don't have symptoms. °When to stay home °Calculating quarantine °The date of your exposure is considered day 0. Day 1 is the first full day after your last contact with a person who has had COVID-19. Stay home and away from other people for at least 5 days. Learn why CDC updated guidance for the general public. °IF YOU were exposed to COVID-19 and are NOT  °up to dateIF YOU were exposed to COVID-19 and are NOT on COVID-19 vaccinations °Quarantine for at least 5 days °Stay home °Stay home and quarantine for at least 5 full days. °Wear a well-fitting mask if you must be around others in your home. °Do not travel. °Get tested °Even if you don't develop symptoms, get tested at least 5 days after you last had close contact with someone with COVID-19. °After quarantine °Watch for symptoms °Watch for symptoms until 10 days after you last had close contact with someone with COVID-19. °Avoid travel °It is best to avoid travel until a full 10 days after you last had close contact with someone with COVID-19. °If you develop symptoms °Isolate immediately and get tested. Continue to stay home until you know the results. Wear a well-fitting mask around others. °Take precautions until day 10 °Wear a well-fitting mask °Wear a well-fitting mask for 10 full days any time you are around others inside your home or in public. Do not go to places where you are unable to wear a well-fitting mask. °If you must travel during days 6-10, take precautions. °Avoid being around people who are more likely to get very sick from COVID-19. °IF YOU were exposed to COVID-19 and are  °up to dateIF YOU were exposed to COVID-19 and are on COVID-19 vaccinations °No  quarantine °You do not need to stay home unless you develop symptoms. °Get tested °Even if you don't develop symptoms, get tested at least 5 days after you last had close contact with someone with COVID-19. °Watch for symptoms °Watch for symptoms until 10 days after you last had close contact with someone with COVID-19. °If you develop symptoms °Isolate immediately and get tested. Continue to stay home until you know the results. Wear a well-fitting mask around others. °Take precautions until day 10 °Wear a well-fitting mask °Wear a well-fitting mask for 10 full days any time you are around others inside your home or in public. Do not go to places where you are unable to wear a well-fitting mask. °Take precautions if traveling °Avoid being around people who are more likely to get very sick from COVID-19. °IF YOU were exposed to COVID-19 and had confirmed COVID-19 within the past 90 days (you tested positive using a viral test) °No quarantine °You do not need to stay home unless you develop symptoms. °Watch for symptoms °Watch for symptoms until 10 days after you last had close contact with someone with COVID-19. °If you develop symptoms °Isolate immediately and get tested. Continue to stay home until you know the results. Wear a well-fitting mask around others. °Take precautions until day 10 °Wear a well-fitting mask °Wear a well-fitting mask for 10 full days any time you are around others inside your home or in public. Do not go to places where you are   unable to wear a well-fitting mask. °Take precautions if traveling °Avoid being around people who are more likely to get very sick from COVID-19. °Calculating isolation °Day 0 is your first day of symptoms or a positive viral test. Day 1 is the first full day after your symptoms developed or your test specimen was collected. If you have COVID-19 or have symptoms, isolate for at least 5 days. °IF YOU tested positive for COVID-19 or have symptoms, regardless of  vaccination status °Stay home for at least 5 days °Stay home for 5 days and isolate from others in your home. °Wear a well-fitting mask if you must be around others in your home. °Do not travel. °Ending isolation if you had symptoms °End isolation after 5 full days if you are fever-free for 24 hours (without the use of fever-reducing medication) and your symptoms are improving. °Ending isolation if you did NOT have symptoms °End isolation after at least 5 full days after your positive test. °If you got very sick from COVID-19 or have a weakened immune system °You should isolate for at least 10 days. Consult your doctor before ending isolation. °Take precautions until day 10 °Wear a well-fitting mask °Wear a well-fitting mask for 10 full days any time you are around others inside your home or in public. Do not go to places where you are unable to wear a well-fitting mask. °Do not travel °Do not travel until a full 10 days after your symptoms started or the date your positive test was taken if you had no symptoms. °Avoid being around people who are more likely to get very sick from COVID-19. °Definitions °Exposure °Contact with someone infected with SARS-CoV-2, the virus that causes COVID-19, in a way that increases the likelihood of getting infected with the virus. °Close contact °A close contact is someone who was less than 6 feet away from an infected person (laboratory-confirmed or a clinical diagnosis) for a cumulative total of 15 minutes or more over a 24-hour period. For example, three individual 5-minute exposures for a total of 15 minutes. People who are exposed to someone with COVID-19 after they completed at least 5 days of isolation are not considered close contacts. °Quarantine °Quarantine is a strategy used to prevent transmission of COVID-19 by keeping people who have been in close contact with someone with COVID-19 apart from others. °Who does not need to quarantine? °If you had close contact with  someone with COVID-19 and you are in one of the following groups, you do not need to quarantine. °You are up to date with your COVID-19 vaccines. °You had confirmed COVID-19 within the last 90 days (meaning you tested positive using a viral test). °If you are up to date with COVID-19 vaccines, you should wear a well-fitting mask around others for 10 days from the date of your last close contact with someone with COVID-19 (the date of last close contact is considered day 0). Get tested at least 5 days after you last had close contact with someone with COVID-19. If you test positive or develop COVID-19 symptoms, isolate from other people and follow recommendations in the Isolation section below. If you tested positive for COVID-19 with a viral test within the previous 90 days and subsequently recovered and remain without COVID-19 symptoms, you do not need to quarantine or get tested after close contact. You should wear a well-fitting mask around others for 10 days from the date of your last close contact with someone with COVID-19 (the date of last   close contact is considered day 0). If you have COVID-19 symptoms, get tested and isolate from other people and follow recommendations in the Isolation section below. °Who should quarantine? °If you come into close contact with someone with COVID-19, you should quarantine if you are not up to date on COVID-19 vaccines. This includes people who are not vaccinated. °What to do for quarantine °Stay home and away from other people for at least 5 days (day 0 through day 5) after your last contact with a person who has COVID-19. The date of your exposure is considered day 0. Wear a well-fitting mask when around others at home, if possible. °For 10 days after your last close contact with someone with COVID-19, watch for fever (100.4°F or greater), cough, shortness of breath, or other COVID-19 symptoms. °If you develop symptoms, get tested immediately and isolate until you receive  your test results. If you test positive, follow isolation recommendations. °If you do not develop symptoms, get tested at least 5 days after you last had close contact with someone with COVID-19. °If you test negative, you can leave your home, but continue to wear a well-fitting mask when around others at home and in public until 10 days after your last close contact with someone with COVID-19. °If you test positive, you should isolate for at least 5 days from the date of your positive test (if you do not have symptoms). If you do develop COVID-19 symptoms, isolate for at least 5 days from the date your symptoms began (the date the symptoms started is day 0). Follow recommendations in the isolation section below. °If you are unable to get a test 5 days after last close contact with someone with COVID-19, you can leave your home after day 5 if you have been without COVID-19 symptoms throughout the 5-day period. Wear a well-fitting mask for 10 days after your date of last close contact when around others at home and in public. °Avoid people who are have weakened immune systems or are more likely to get very sick from COVID-19, and nursing homes and other high-risk settings, until after at least 10 days. °If possible, stay away from people you live with, especially people who are at higher risk for getting very sick from COVID-19, as well as others outside your home throughout the full 10 days after your last close contact with someone with COVID-19. °If you are unable to quarantine, you should wear a well-fitting mask for 10 days when around others at home and in public. °If you are unable to wear a mask when around others, you should continue to quarantine for 10 days. Avoid people who have weakened immune systems or are more likely to get very sick from COVID-19, and nursing homes and other high-risk settings, until after at least 10 days. °See additional information about travel. °Do not go to places where you are  unable to wear a mask, such as restaurants and some gyms, and avoid eating around others at home and at work until after 10 days after your last close contact with someone with COVID-19. °After quarantine °Watch for symptoms until 10 days after your last close contact with someone with COVID-19. °If you have symptoms, isolate immediately and get tested. °Quarantine in high-risk congregate settings °In certain congregate settings that have high risk of secondary transmission (such as correctional and detention facilities, homeless shelters, or cruise ships), CDC recommends a 10-day quarantine for residents, regardless of vaccination and booster status. During periods of critical staffing   shortages, facilities may consider shortening the quarantine period for staff to ensure continuity of operations. Decisions to shorten quarantine in these settings should be made in consultation with state, local, tribal, or territorial health departments and should take into consideration the context and characteristics of the facility. CDC's setting-specific guidance provides additional recommendations for these settings. °Isolation °Isolation is used to separate people with confirmed or suspected COVID-19 from those without COVID-19. People who are in isolation should stay home until it's safe for them to be around others. At home, anyone sick or infected should separate from others, or wear a well-fitting mask when they need to be around others. People in isolation should stay in a specific "sick room" or area and use a separate bathroom if available. Everyone who has presumed or confirmed COVID-19 should stay home and isolate from other people for at least 5 full days (day 0 is the first day of symptoms or the date of the day of the positive viral test for asymptomatic persons). They should wear a mask when around others at home and in public for an additional 5 days. People who are confirmed to have COVID-19 or are showing  symptoms of COVID-19 need to isolate regardless of their vaccination status. This includes: °People who have a positive viral test for COVID-19, regardless of whether or not they have symptoms. °People with symptoms of COVID-19, including people who are awaiting test results or have not been tested. People with symptoms should isolate even if they do not know if they have been in close contact with someone with COVID-19. °What to do for isolation °Monitor your symptoms. If you have an emergency warning sign (including trouble breathing), seek emergency medical care immediately. °Stay in a separate room from other household members, if possible. °Use a separate bathroom, if possible. °Take steps to improve ventilation at home, if possible. °Avoid contact with other members of the household and pets. °Don't share personal household items, like cups, towels, and utensils. °Wear a well-fitting mask when you need to be around other people. °Learn more about what to do if you are sick and how to notify your contacts. °Ending isolation for people who had COVID-19 and had symptoms °If you had COVID-19 and had symptoms, isolate for at least 5 days. To calculate your 5-day isolation period, day 0 is your first day of symptoms. Day 1 is the first full day after your symptoms developed. You can leave isolation after 5 full days. °You can end isolation after 5 full days if you are fever-free for 24 hours without the use of fever-reducing medication and your other symptoms have improved (Loss of taste and smell may persist for weeks or months after recovery and need not delay the end of isolation). °You should continue to wear a well-fitting mask around others at home and in public for 5 additional days (day 6 through day 10) after the end of your 5-day isolation period. If you are unable to wear a mask when around others, you should continue to isolate for a full 10 days. Avoid people who have weakened immune systems or are more  likely to get very sick from COVID-19, and nursing homes and other high-risk settings, until after at least 10 days. °If you continue to have fever or your other symptoms have not improved after 5 days of isolation, you should wait to end your isolation until you are fever-free for 24 hours without the use of fever-reducing medication and your other symptoms have improved.   Continue to wear a well-fitting mask through day 10. Contact your healthcare provider if you have questions. °See additional information about travel. °Do not go to places where you are unable to wear a mask, such as restaurants and some gyms, and avoid eating around others at home and at work until a full 10 days after your first day of symptoms. °If an individual has access to a test and wants to test, the best approach is to use an antigen test1 towards the end of the 5-day isolation period. Collect the test sample only if you are fever-free for 24 hours without the use of fever-reducing medication and your other symptoms have improved (loss of taste and smell may persist for weeks or months after recovery and need not delay the end of isolation). If your test result is positive, you should continue to isolate until day 10. If your test result is negative, you can end isolation, but continue to wear a well-fitting mask around others at home and in public until day 10. Follow additional recommendations for masking and avoiding travel as described above. °1As noted in the labeling for authorized over-the counter antigen tests: Negative results should be treated as presumptive. Negative results do not rule out SARS-CoV-2 infection and should not be used as the sole basis for treatment or patient management decisions, including infection control decisions. To improve results, antigen tests should be used twice over a three-day period with at least 24 hours and no more than 48 hours between tests. °Note that these recommendations on ending isolation  do not apply to people who are moderately ill or very sick from COVID-19 or have weakened immune systems. See section below for recommendations for when to end isolation for these groups. °Ending isolation for people who tested positive for COVID-19 but had no symptoms °If you test positive for COVID-19 and never develop symptoms, isolate for at least 5 days. Day 0 is the day of your positive viral test (based on the date you were tested) and day 1 is the first full day after the specimen was collected for your positive test. You can leave isolation after 5 full days. °If you continue to have no symptoms, you can end isolation after at least 5 days. °You should continue to wear a well-fitting mask around others at home and in public until day 10 (day 6 through day 10). If you are unable to wear a mask when around others, you should continue to isolate for 10 days. Avoid people who have weakened immune systems or are more likely to get very sick from COVID-19, and nursing homes and other high-risk settings, until after at least 10 days. °If you develop symptoms after testing positive, your 5-day isolation period should start over. Day 0 is your first day of symptoms. Follow the recommendations above for ending isolation for people who had COVID-19 and had symptoms. °See additional information about travel. °Do not go to places where you are unable to wear a mask, such as restaurants and some gyms, and avoid eating around others at home and at work until 10 days after the day of your positive test. °If an individual has access to a test and wants to test, the best approach is to use an antigen test1 towards the end of the 5-day isolation period. If your test result is positive, you should continue to isolate until day 10. If your test result is positive, you can also choose to test daily and if your test result   is negative, you can end isolation, but continue to wear a well-fitting mask around others at home and in  public until day 10. Follow additional recommendations for masking and avoiding travel as described above. °1As noted in the labeling for authorized over-the counter antigen tests: Negative results should be treated as presumptive. Negative results do not rule out SARS-CoV-2 infection and should not be used as the sole basis for treatment or patient management decisions, including infection control decisions. To improve results, antigen tests should be used twice over a three-day period with at least 24 hours and no more than 48 hours between tests. °Ending isolation for people who were moderately or very sick from COVID-19 or have a weakened immune system °People who are moderately ill from COVID-19 (experiencing symptoms that affect the lungs like shortness of breath or difficulty breathing) should isolate for 10 days and follow all other isolation precautions. To calculate your 10-day isolation period, day 0 is your first day of symptoms. Day 1 is the first full day after your symptoms developed. If you are unsure if your symptoms are moderate, talk to a healthcare provider for further guidance. °People who are very sick from COVID-19 (this means people who were hospitalized or required intensive care or ventilation support) and people who have weakened immune systems might need to isolate at home longer. They may also require testing with a viral test to determine when they can be around others. CDC recommends an isolation period of at least 10 and up to 20 days for people who were very sick from COVID-19 and for people with weakened immune systems. Consult with your healthcare provider about when you can resume being around other people. If you are unsure if your symptoms are severe or if you have a weakened immune system, talk to a healthcare provider for further guidance. °People who have a weakened immune system should talk to their healthcare provider about the potential for reduced immune responses to  COVID-19 vaccines and the need to continue to follow current prevention measures (including wearing a well-fitting mask and avoiding crowds and poorly ventilated indoor spaces) to protect themselves against COVID-19 until advised otherwise by their healthcare provider. Close contacts of immunocompromised people--including household members--should also be encouraged to receive all recommended COVID-19 vaccine doses to help protect these people. °Isolation in high-risk congregate settings °In certain high-risk congregate settings that have high risk of secondary transmission and where it is not feasible to cohort people (such as correctional and detention facilities, homeless shelters, and cruise ships), CDC recommends a 10-day isolation period for residents. During periods of critical staffing shortages, facilities may consider shortening the isolation period for staff to ensure continuity of operations. Decisions to shorten isolation in these settings should be made in consultation with state, local, tribal, or territorial health departments and should take into consideration the context and characteristics of the facility. CDC's setting-specific guidance provides additional recommendations for these settings. °This CDC guidance is meant to supplement--not replace--any federal, state, local, territorial, or tribal health and safety laws, rules, and regulations. °Recommendations for specific settings °These recommendations do not apply to healthcare professionals. For guidance specific to these settings, see °Healthcare professionals: Interim Guidance for Managing Healthcare Personnel with SARS-CoV-2 Infection or Exposure to SARS-CoV-2 °Patients, residents, and visitors to healthcare settings: Interim Infection Prevention and Control Recommendations for Healthcare Personnel During the Coronavirus Disease 2019 (COVID-19) Pandemic °Additional setting-specific guidance and recommendations are available. °These  recommendations on quarantine and isolation do apply to K[employer] School   settings. Additional guidance is available here: Overview of COVID-19 Quarantine for K-12 Schools °Travelers: Travel information and recommendations °Congregate facilities and other settings: guidance pages for community, work, and school settings °Ongoing COVID-19 exposure FAQs °I live with someone with COVID-19, but I cannot be separated from them. How do we manage quarantine in this situation? °It is very important for people with COVID-19 to remain apart from other people, if possible, even if they are living together. If separation of the person with COVID-19 from others that they live with is not possible, the other people that they live with will have ongoing exposure, meaning they will be repeatedly exposed until that person is no longer able to spread the virus to other people. In this situation, there are precautions you can take to limit the spread of COVID-19: °The person with COVID-19 and everyone they live with should wear a well-fitting mask inside the home. °If possible, one person should care for the person with COVID-19 to limit the number of people who are in close contact with the infected person. °Take steps to protect yourself and others to reduce transmission in the home: °Quarantine if you are not up to date with your COVID-19 vaccines. °Isolate if you are sick or tested positive for COVID-19, even if you don't have symptoms. °Learn more about the public health recommendations for testing, mask use and quarantine of close contacts, like yourself, who have ongoing exposure. These recommendations differ depending on your vaccination status. °What should I do if I have ongoing exposure to COVID-19 from someone I live with? °Recommendations for this situation depend on your vaccination status: °If you are not up to date on COVID-19 vaccines and have ongoing exposure to COVID-19, you should: °Begin quarantine immediately and  continue to quarantine throughout the isolation period of the person with COVID-19. °Continue to quarantine for an additional 5 days starting the day after the end of isolation for the person with COVID-19. °Get tested at least 5 days after the end of isolation of the infected person that lives with them. °If you test negative, you can leave the home but should continue to wear a well-fitting mask when around others at home and in public until 10 days after the end of isolation for the person with COVID-19. °Isolate immediately if you develop symptoms of COVID-19 or test positive. °If you are up to date with COVID-19 vaccines and have ongoing exposure to COVID-19, you should: °Get tested at least 5 days after your first exposure. A person with COVID-19 is considered infectious starting 2 days before they develop symptoms, or 2 days before the date of their positive test if they do not have symptoms. °Get tested again at least 5 days after the end of isolation for the person with COVID-19. °Wear a well-fitting mask when you are around the person with COVID-19, and do this throughout their isolation period. °Wear a well-fitting mask around others for 10 days after the infected person's isolation period ends. °Isolate immediately if you develop symptoms of COVID-19 or test positive. °What should I do if multiple people I live with test positive for COVID-19 at different times? °Recommendations for this situation depend on your vaccination status: °If you are not up to date with your COVID-19 vaccines, you should: °Quarantine throughout the isolation period of any infected person that you live with. °Continue to quarantine until 5 days after the end of isolation date for the most recently infected person that lives with you. For example, if   the last day of isolation of the person most recently infected with COVID-19 was June 30, the new 5-day quarantine period starts on July 1. °Get tested at least 5 days after the end  of isolation for the most recently infected person that lives with you. °Wear a well-fitting mask when you are around any person with COVID-19 while that person is in isolation. °Wear a well-fitting mask when you are around other people until 10 days after your last close contact. °Isolate immediately if you develop symptoms of COVID-19 or test positive. °If you are up to date with your COVID-19 vaccines, you should: °Get tested at least 5 days after your first exposure. A person with COVID-19 is considered infectious starting 2 days before they developed symptoms, or 2 days before the date of their positive test if they do not have symptoms. °Get tested again at least 5 days after the end of isolation for the most recently infected person that lives with you. °Wear a well-fitting mask when you are around any person with COVID-19 while that person is in isolation. °Wear a well-fitting mask around others for 10 days after the end of isolation for the most recently infected person that lives with you. For example, if the last day of isolation for the person most recently infected with COVID-19 was June 30, the new 10-day period to wear a well-fitting mask indoors in public starts on July 1. °Isolate immediately if you develop symptoms of COVID-19 or test positive. °I had COVID-19 and completed isolation. Do I have to quarantine or get tested if someone I live with gets COVID-19 shortly after I completed isolation? °No. If you recently completed isolation and someone that lives with you tests positive for the virus that causes COVID-19 shortly after the end of your isolation period, you do not have to quarantine or get tested as long as you do not develop new symptoms. Once all of the people that live together have completed isolation or quarantine, refer to the guidance below for new exposures to COVID-19. °If you had COVID-19 in the previous 90 days and then came into close contact with someone with COVID-19, you do  not have to quarantine or get tested if you do not have symptoms. But you should: °Wear a well-fitting mask indoors in public for 10 days after your last close contact. °Monitor for COVID-19 symptoms for 10 days from the date of your last close contact. °Isolate immediately and get tested if symptoms develop. °If more than 90 days have passed since your recovery from infection, follow CDC's recommendations for close contacts. These recommendations will differ depending on your vaccination status. °07/12/2020 °Content source: National Center for Immunization and Respiratory Diseases (NCIRD), Division of Viral Diseases °This information is not intended to replace advice given to you by your health care provider. Make sure you discuss any questions you have with your health care provider. °Document Revised: 11/15/2020 Document Reviewed: 11/15/2020 °Elsevier Patient Education © 2022 [employer] ° °

## 2021-06-26 ENCOUNTER — Other Ambulatory Visit: Payer: Self-pay | Admitting: Internal Medicine

## 2021-07-10 ENCOUNTER — Other Ambulatory Visit (HOSPITAL_COMMUNITY): Payer: Self-pay

## 2021-07-10 ENCOUNTER — Encounter: Payer: Self-pay | Admitting: Internal Medicine

## 2021-07-10 ENCOUNTER — Other Ambulatory Visit: Payer: Self-pay | Admitting: Internal Medicine

## 2021-07-10 MED ORDER — MOUNJARO 10 MG/0.5ML ~~LOC~~ SOAJ
10.0000 mg | SUBCUTANEOUS | 3 refills | Status: DC
  Filled 2021-07-10 – 2021-07-25 (×5): qty 0.5, 7d supply, fill #0

## 2021-07-11 ENCOUNTER — Other Ambulatory Visit (HOSPITAL_COMMUNITY): Payer: Self-pay

## 2021-07-17 ENCOUNTER — Other Ambulatory Visit (HOSPITAL_COMMUNITY): Payer: Self-pay

## 2021-07-18 ENCOUNTER — Other Ambulatory Visit (HOSPITAL_COMMUNITY): Payer: Self-pay

## 2021-07-25 ENCOUNTER — Other Ambulatory Visit: Payer: Self-pay | Admitting: Internal Medicine

## 2021-07-25 ENCOUNTER — Other Ambulatory Visit (HOSPITAL_COMMUNITY): Payer: Self-pay

## 2021-07-25 MED ORDER — MOUNJARO 10 MG/0.5ML ~~LOC~~ SOAJ
10.0000 mg | SUBCUTANEOUS | 1 refills | Status: DC
  Filled 2021-07-25: qty 2, 28d supply, fill #0

## 2021-08-06 ENCOUNTER — Encounter: Payer: Self-pay | Admitting: Internal Medicine

## 2021-08-06 ENCOUNTER — Ambulatory Visit (INDEPENDENT_AMBULATORY_CARE_PROVIDER_SITE_OTHER): Payer: BC Managed Care – PPO | Admitting: Internal Medicine

## 2021-08-06 ENCOUNTER — Other Ambulatory Visit (HOSPITAL_COMMUNITY): Payer: Self-pay

## 2021-08-06 VITALS — BP 120/78 | HR 91 | Temp 97.7°F | Ht 63.0 in | Wt 216.4 lb

## 2021-08-06 DIAGNOSIS — E1169 Type 2 diabetes mellitus with other specified complication: Secondary | ICD-10-CM | POA: Diagnosis not present

## 2021-08-06 DIAGNOSIS — Z0000 Encounter for general adult medical examination without abnormal findings: Secondary | ICD-10-CM

## 2021-08-06 DIAGNOSIS — I10 Essential (primary) hypertension: Secondary | ICD-10-CM | POA: Diagnosis not present

## 2021-08-06 DIAGNOSIS — E7800 Pure hypercholesterolemia, unspecified: Secondary | ICD-10-CM

## 2021-08-06 MED ORDER — MOUNJARO 12.5 MG/0.5ML ~~LOC~~ SOAJ
12.5000 mg | SUBCUTANEOUS | 0 refills | Status: DC
  Filled 2021-08-06: qty 2, 28d supply, fill #0

## 2021-08-06 NOTE — Progress Notes (Signed)
?[name],acting as a Neurosurgeon for [name], MD.,have documented all relevant documentation on the behalf of [name], MD,as directed by  [name], MD while in the presence of [name], MD.  ?This visit occurred during the SARS-CoV-2 public health emergency.  Safety protocols were in place, including screening questions prior to the visit, additional usage of staff PPE, and extensive cleaning of exam room while observing appropriate contact time as indicated for disinfecting solutions. ? ?Subjective:  ?  ? Patient ID: Sue Bell , female    DOB: 25-Feb-1971 , 51 y.o.   MRN: [id number] ? ? ?Chief Complaint  ?Patient presents with  ? Annual Exam  ? Diabetes  ? Hypertension  ? ? ?HPI ? ?She is here today for a full physical examination. She is followed by Dr. Su Hilt, GYN for her pelvic exams. She has no specific concerns at this time. She reports compliance with meds. Admits she is not exercising as much as she should.  ? ?Diabetes ?She presents for her follow-up diabetic visit. She has type 2 diabetes mellitus. There are no hypoglycemic associated symptoms. There are no diabetic associated symptoms. Pertinent negatives for diabetes include no blurred vision and no chest pain. There are no hypoglycemic complications. Risk factors for coronary artery disease include diabetes mellitus, obesity and sedentary lifestyle. She is compliant with treatment most of the time. She participates in exercise intermittently. An ACE inhibitor/angiotensin II receptor blocker is not being taken.  ?Hypertension ?This is a chronic problem. The current episode started more than 1 year ago. The problem has been gradually improving since onset. The problem is controlled. Pertinent negatives include no blurred vision, chest pain, palpitations or shortness of breath. Past treatments include angiotensin blockers. The current treatment provides moderate improvement. Compliance problems include exercise.     ? ?Past Medical History:  ?Diagnosis Date  ? Diabetes mellitus without complication (HCC)   ? Hyperlipidemia   ? Hypertension   ? OSA (obstructive sleep apnea) 07/08/2016  ?  ? ?Family History  ?Problem Relation Age of Onset  ? Heart disease Mother   ? Diabetes Father   ? Cancer Father   ? Breast cancer Sister   ? ? ? ?Current Outpatient Medications:  ?  Calcium-Magnesium-Vitamin D (CALCIUM 500 PO), Take by mouth., Disp: , Rfl:  ?  Chlorphen-PE-Acetaminophen (NOREL AD) 4-10-325 MG TABS, Take 4-10 mg by mouth 2 (two) times daily as needed., Disp: 20 tablet, Rfl: 0 ?  DAYVIGO 10 MG TABS, TAKE 1 TABLET BY MOUTH AT BEDTIME AS NEEDED, Disp: 30 tablet, Rfl: 2 ?  levocetirizine (XYZAL) 5 MG tablet, TAKE 1 TABLET BY MOUTH EVERY EVENING, Disp: 90 tablet, Rfl: 2 ?  mometasone (NASONEX) 50 MCG/ACT nasal spray, Place 1 spray into the nose daily., Disp: 17 g, Rfl: 3 ?  Naftifine HCl 2 % GEL, Apply topically to affected area daily prn, Disp: 60 g, Rfl: 0 ?  nystatin powder, Apply 1 application topically 3 (three) times daily. prn, Disp: 30 g, Rfl: 0 ?  tirzepatide (MOUNJARO) 10 MG/0.5ML Pen, Inject 10 mg into the skin once a week., Disp: 2 mL, Rfl: 1 ?  tirzepatide (MOUNJARO) 12.5 MG/0.5ML Pen, Inject 12.5 mg into the skin once a week., Disp: 2 mL, Rfl: 0 ?  valsartan (DIOVAN) 160 MG tablet, TAKE 1 TABLET(160 MG) BY MOUTH DAILY, Disp: 90 tablet, Rfl: 1  ? ?Allergies  ?Allergen Reactions  ? Sulfa Antibiotics Hives  ?  ? ? ?The  patient states she uses status post hysterectomy for birth control. Last LMP was No LMP recorded. Patient has had a hysterectomy.. Negative for Dysmenorrhea. Negative for: breast discharge, breast lump(s), breast pain and breast self exam. Associated symptoms include abnormal vaginal bleeding. Pertinent negatives include abnormal bleeding (hematology), anxiety, decreased libido, depression, difficulty falling sleep, dyspareunia, history of infertility, nocturia, sexual dysfunction, sleep disturbances,  urinary incontinence, urinary urgency, vaginal discharge and vaginal itching. Diet regular.The patient states her exercise level is  intermittent.  ? . The patient's tobacco use is:  ?Social History  ? ?Tobacco Use  ?Smoking Status Never  ?Smokeless Tobacco Never  ?Marland Kitchen She has been exposed to passive smoke. The patient's alcohol use is:  ?Social History  ? ?Substance and Sexual Activity  ?Alcohol Use No  ? ?Review of Systems  ?Constitutional: Negative.   ?HENT: Negative.    ?Eyes: Negative.  Negative for blurred vision.  ?Respiratory: Negative.  Negative for shortness of breath.   ?Cardiovascular: Negative.  Negative for chest pain and palpitations.  ?Gastrointestinal: Negative.   ?Endocrine: Negative.   ?Genitourinary: Negative.   ?Musculoskeletal: Negative.   ?Skin: Negative.   ?Allergic/Immunologic: Negative.   ?Neurological: Negative.   ?Hematological: Negative.   ?Psychiatric/Behavioral: Negative.     ? ?Today's Vitals  ? 08/06/21 1152  ?BP: 120/78  ?Pulse: 91  ?Temp: 97.7 ?F (36.5 ?C)  ?Weight: 216 lb 6.4 oz (98.2 kg)  ?Height: 5\' 3"  (1.6 m)  ?PainSc: 0-No pain  ? ?Body mass index is 38.33 kg/m?.  ?Wt Readings from Last 3 Encounters:  ?08/06/21 216 lb 6.4 oz (98.2 kg)  ?05/24/21 218 lb 12.8 oz (99.2 kg)  ?03/22/21 221 lb 3.2 oz (100.3 kg)  ?  ? ?Objective:  ?Physical Exam ?Vitals and nursing note reviewed.  ?Constitutional:   ?   Appearance: Normal appearance. She is obese.  ?HENT:  ?   Head: Normocephalic and atraumatic.  ?   Right Ear: Tympanic membrane, ear canal and external ear normal.  ?   Left Ear: Tympanic membrane, ear canal and external ear normal.  ?   Nose: Nose normal.  ?   Mouth/Throat:  ?   Mouth: Mucous membranes are moist.  ?   Pharynx: Oropharynx is clear.  ?Eyes:  ?   Extraocular Movements: Extraocular movements intact.  ?   Conjunctiva/sclera: Conjunctivae normal.  ?   Pupils: Pupils are equal, round, and reactive to light.  ?Cardiovascular:  ?   Rate and Rhythm: Normal rate and regular  rhythm.  ?   Pulses: Normal pulses.     ?     Dorsalis pedis pulses are 2+ on the right side and 2+ on the left side.  ?   Heart sounds: Normal heart sounds.  ?Pulmonary:  ?   Effort: Pulmonary effort is normal.  ?   Breath sounds: Normal breath sounds.  ?Chest:  ?Breasts: ?   Tanner Score is 5.  ?   Right: Normal.  ?   Left: Normal.  ?   Comments: Healed surgical scars ?Abdominal:  ?   General: Abdomen is flat. Bowel sounds are normal.  ?   Palpations: Abdomen is soft.  ?Genitourinary: ?   Comments: deferred ?Musculoskeletal:     ?   General: Normal range of motion.  ?   Cervical back: Normal range of motion and neck supple.  ?Feet:  ?   Right foot:  ?   Protective Sensation: 5 sites tested.  5 sites sensed.  ?  Skin integrity: Skin integrity normal.  ?   Toenail Condition: Right toenails are normal.  ?   Left foot:  ?   Protective Sensation: 5 sites tested.  5 sites sensed.  ?   Skin integrity: Skin integrity normal.  ?   Toenail Condition: Left toenails are normal.  ?Skin: ?   General: Skin is warm and dry.  ?Neurological:  ?   General: No focal deficit present.  ?   Mental Status: She is alert and oriented to person, place, and time.  ?Psychiatric:     ?   Mood and Affect: Mood normal.     ?   Behavior: Behavior normal.  ?   ?Assessment And Plan:  ?   ?1. Encounter for general adult medical examination w/o abnormal findings ?Comments: A full exam was performed. Importance of monthly self breast exams was discussed with the patient. PATIENT IS ADVISED TO GET 30-45 MINUTES REGULAR EXERCISE NO LESS THAN FOUR TO FIVE DAYS PER WEEK - BOTH WEIGHTBEARING EXERCISES AND AEROBIC ARE RECOMMENDED.  PATIENT IS ADVISED TO FOLLOW A HEALTHY DIET WITH AT LEAST SIX FRUITS/VEGGIES PER DAY, DECREASE INTAKE OF RED MEAT, AND TO INCREASE FISH INTAKE TO TWO DAYS PER WEEK.  MEATS/FISH SHOULD NOT BE FRIED, BAKED OR BROILED IS PREFERABLE.  IT IS ALSO IMPORTANT TO CUT BACK ON YOUR SUGAR INTAKE. PLEASE AVOID ANYTHING WITH ADDED SUGAR, CORN  SYRUP OR OTHER SWEETENERS. IF YOU MUST USE A SWEETENER, YOU CAN TRY STEVIA. IT IS ALSO IMPORTANT TO AVOID ARTIFICIALLY SWEETENERS AND DIET BEVERAGES. LASTLY, I SUGGEST WEARING SPF 50 SUNSCREEN ON EXPOSED PART

## 2021-08-06 NOTE — Patient Instructions (Signed)

## 2021-08-08 LAB — MICROALBUMIN / CREATININE URINE RATIO
Creatinine, Urine: 296.6 mg/dL
Microalb/Creat Ratio: 7 mg/g creat (ref 0–29)
Microalbumin, Urine: 20 ug/mL

## 2021-08-08 LAB — CMP14+EGFR
ALT: 12 IU/L (ref 0–32)
AST: 20 IU/L (ref 0–40)
Albumin/Globulin Ratio: 1.7 (ref 1.2–2.2)
Albumin: 4.4 g/dL (ref 3.8–4.9)
Alkaline Phosphatase: 82 IU/L (ref 44–121)
BUN/Creatinine Ratio: 14 (ref 9–23)
BUN: 12 mg/dL (ref 6–24)
Bilirubin Total: 0.3 mg/dL (ref 0.0–1.2)
CO2: 22 mmol/L (ref 20–29)
Calcium: 9.4 mg/dL (ref 8.7–10.2)
Chloride: 102 mmol/L (ref 96–106)
Creatinine, Ser: 0.87 mg/dL (ref 0.57–1.00)
Globulin, Total: 2.6 g/dL (ref 1.5–4.5)
Glucose: 82 mg/dL (ref 70–99)
Potassium: 4.3 mmol/L (ref 3.5–5.2)
Sodium: 141 mmol/L (ref 134–144)
Total Protein: 7 g/dL (ref 6.0–8.5)
eGFR: 81 mL/min/{1.73_m2} (ref >59)

## 2021-08-08 LAB — CBC
Hematocrit: 34.8 % (ref 34.0–46.6)
Hemoglobin: 11.5 g/dL (ref 11.1–15.9)
MCH: 26.8 pg (ref 26.6–33.0)
MCHC: 33 g/dL (ref 31.5–35.7)
MCV: 81 fL (ref 79–97)
Platelets: 375 10*3/uL (ref 150–450)
RBC: 4.29 x10E6/uL (ref 3.77–5.28)
RDW: 13.7 % (ref 11.7–15.4)
WBC: 6.5 10*3/uL (ref 3.4–10.8)

## 2021-08-08 LAB — LIPID PANEL
Chol/HDL Ratio: 4.2 ratio (ref 0.0–4.4)
Cholesterol, Total: 280 mg/dL — ABNORMAL HIGH (ref 100–199)
HDL: 67 mg/dL (ref >39)
LDL Chol Calc (NIH): 194 mg/dL — ABNORMAL HIGH (ref 0–99)
Triglycerides: 107 mg/dL (ref 0–149)
VLDL Cholesterol Cal: 19 mg/dL (ref 5–40)

## 2021-08-08 LAB — HEMOGLOBIN A1C
Est. average glucose Bld gHb Est-mCnc: 123 mg/dL
Hgb A1c MFr Bld: 5.9 % — ABNORMAL HIGH (ref 4.8–5.6)

## 2021-08-18 ENCOUNTER — Other Ambulatory Visit: Payer: Self-pay | Admitting: Internal Medicine

## 2021-08-18 DIAGNOSIS — E1169 Type 2 diabetes mellitus with other specified complication: Secondary | ICD-10-CM

## 2021-08-22 ENCOUNTER — Encounter: Payer: Self-pay | Admitting: Internal Medicine

## 2021-08-22 ENCOUNTER — Other Ambulatory Visit: Payer: Self-pay

## 2021-08-22 ENCOUNTER — Other Ambulatory Visit (HOSPITAL_COMMUNITY): Payer: Self-pay

## 2021-08-22 MED ORDER — NOREL AD 4-10-325 MG PO TABS: 4.0000 mg | ORAL_TABLET | Freq: Two times a day (BID) | ORAL | 0 refills | Status: DC | PRN

## 2021-08-22 MED ORDER — NOREL AD 4-10-325 MG PO TABS
1.0000 | ORAL_TABLET | Freq: Two times a day (BID) | ORAL | 0 refills | Status: DC | PRN
  Filled 2021-08-22 (×2): qty 20, 10d supply, fill #0

## 2021-08-27 ENCOUNTER — Telehealth: Payer: BC Managed Care – PPO | Admitting: Nurse Practitioner

## 2021-09-14 ENCOUNTER — Other Ambulatory Visit: Payer: Self-pay | Admitting: Internal Medicine

## 2021-09-14 DIAGNOSIS — F5101 Primary insomnia: Secondary | ICD-10-CM

## 2021-09-21 ENCOUNTER — Other Ambulatory Visit: Payer: Self-pay | Admitting: Internal Medicine

## 2021-09-21 ENCOUNTER — Ambulatory Visit
Admission: RE | Admit: 2021-09-21 | Discharge: 2021-09-21 | Disposition: A | Discharge status: Home / Self Care | Payer: No Typology Code available for payment source | Source: Ambulatory Visit | Attending: Internal Medicine | Admitting: Internal Medicine

## 2021-09-21 DIAGNOSIS — E1169 Type 2 diabetes mellitus with other specified complication: Secondary | ICD-10-CM

## 2021-09-24 ENCOUNTER — Other Ambulatory Visit (HOSPITAL_COMMUNITY): Payer: Self-pay

## 2021-09-24 MED ORDER — MOUNJARO 12.5 MG/0.5ML ~~LOC~~ SOAJ
12.5000 mg | SUBCUTANEOUS | 1 refills | Status: DC
  Filled 2021-09-24: qty 2, 28d supply, fill #0

## 2021-09-28 ENCOUNTER — Other Ambulatory Visit (HOSPITAL_COMMUNITY): Payer: Self-pay

## 2021-10-03 ENCOUNTER — Other Ambulatory Visit (HOSPITAL_COMMUNITY): Payer: Self-pay

## 2021-10-08 ENCOUNTER — Other Ambulatory Visit (HOSPITAL_COMMUNITY): Payer: Self-pay

## 2021-10-29 ENCOUNTER — Other Ambulatory Visit: Payer: Self-pay | Admitting: Internal Medicine

## 2021-10-29 DIAGNOSIS — I10 Essential (primary) hypertension: Secondary | ICD-10-CM

## 2021-10-29 DIAGNOSIS — J301 Allergic rhinitis due to pollen: Secondary | ICD-10-CM

## 2021-11-05 ENCOUNTER — Encounter: Payer: Self-pay | Admitting: Internal Medicine

## 2021-11-05 ENCOUNTER — Other Ambulatory Visit (HOSPITAL_COMMUNITY): Payer: Self-pay

## 2021-11-05 ENCOUNTER — Ambulatory Visit: Payer: BC Managed Care – PPO | Admitting: Internal Medicine

## 2021-11-05 VITALS — BP 124/80 | HR 94 | Temp 98.3°F | Ht 62.4 in | Wt 205.2 lb

## 2021-11-05 DIAGNOSIS — I10 Essential (primary) hypertension: Secondary | ICD-10-CM | POA: Diagnosis not present

## 2021-11-05 DIAGNOSIS — E7800 Pure hypercholesterolemia, unspecified: Secondary | ICD-10-CM

## 2021-11-05 DIAGNOSIS — Z6837 Body mass index (BMI) 37.0-37.9, adult: Secondary | ICD-10-CM

## 2021-11-05 DIAGNOSIS — E1169 Type 2 diabetes mellitus with other specified complication: Secondary | ICD-10-CM | POA: Diagnosis not present

## 2021-11-05 MED ORDER — MOUNJARO 12.5 MG/0.5ML ~~LOC~~ SOAJ: 12.5000 mg | SUBCUTANEOUS | 3 refills | Status: DC

## 2021-11-05 MED ORDER — MOUNJARO 15 MG/0.5ML ~~LOC~~ SOAJ
15.0000 mg | SUBCUTANEOUS | 3 refills | Status: DC
  Filled 2021-11-05 – 2021-12-18 (×2): qty 2, 28d supply, fill #0
  Filled 2022-01-13: qty 2, 28d supply, fill #1
  Filled 2022-02-10: qty 2, 28d supply, fill #2
  Filled 2022-03-11: qty 2, 28d supply, fill #3

## 2021-11-05 MED ORDER — MOUNJARO 12.5 MG/0.5ML ~~LOC~~ SOAJ
12.5000 mg | SUBCUTANEOUS | 3 refills | Status: DC
  Filled 2021-11-05: qty 2, 28d supply, fill #0
  Filled 2021-11-30: qty 2, 28d supply, fill #1

## 2021-11-05 NOTE — Patient Instructions (Signed)

## 2021-11-05 NOTE — Progress Notes (Signed)
Rich Brave Llittleton,acting as a Education administrator for Maximino Greenland, MD.,have documented all relevant documentation on the behalf of Maximino Greenland, MD,as directed by  Maximino Greenland, MD while in the presence of Maximino Greenland, MD.    Subjective:     Patient ID: Sue Bell , female    DOB: 08-Feb-1971 , 51 y.o.   MRN: [id number]   Chief Complaint  Patient presents with   Diabetes    HPI  She is here today for DM f/u. She is now on Mounjaro 12.33m weekly.  She has not had any issues with the medication. [place] is [place] to [place]   Diabetes She presents for her follow-up diabetic visit. She has type 2 diabetes mellitus. There are no hypoglycemic associated symptoms. There are no diabetic associated symptoms. Pertinent negatives for diabetes include no blurred vision, no chest pain, no polydipsia, no polyphagia and no polyuria. There are no hypoglycemic complications. Risk factors for coronary artery disease include diabetes mellitus, obesity and sedentary lifestyle. She is compliant with treatment most of the time. She participates in exercise intermittently. An ACE inhibitor/angiotensin II receptor blocker is not being taken.  Hypertension This is a chronic problem. The current episode started more than 1 year ago. The problem has been gradually improving since onset. The problem is controlled. Pertinent negatives include no blurred vision, chest pain, palpitations or shortness of breath. Past treatments include angiotensin blockers. The current treatment provides moderate improvement. Compliance problems include exercise.      Past Medical History:  Diagnosis Date   Diabetes mellitus without complication (HCC)    Hyperlipidemia    Hypertension    OSA (obstructive sleep apnea) 07/08/2016     Family History  Problem Relation Age of Onset   Heart disease Mother    Diabetes Father    Cancer Father    Breast cancer Sister       Current Outpatient Medications:    Calcium-Magnesium-Vitamin D (CALCIUM 500 PO), Take by mouth., Disp: , Rfl:    Chlorphen-PE-Acetaminophen (NOREL AD) 4-10-325 MG TABS, Take 1 tablet by mouth 2 (two) times daily as needed., Disp: 20 tablet, Rfl: 0   DAYVIGO 10 MG TABS, TAKE 1 TABLET BY MOUTH AT BEDTIME AS NEEDED, Disp: 30 tablet, Rfl: 2   levocetirizine (XYZAL) 5 MG tablet, TAKE 1 TABLET BY MOUTH EVERY EVENING, Disp: 90 tablet, Rfl: 2   mometasone (NASONEX) 50 MCG/ACT nasal spray, Place 1 spray into the nose daily., Disp: 17 g, Rfl: 3   Naftifine HCl 2 % GEL, Apply topically to affected area daily prn, Disp: 60 g, Rfl: 0   nystatin powder, Apply 1 application topically 3 (three) times daily. prn, Disp: 30 g, Rfl: 0   tirzepatide (MOUNJARO) 12.5 MG/0.5ML Pen, Inject 12.5 mg into the skin once a week., Disp: 2 mL, Rfl: 3   tirzepatide (MOUNJARO) 15 MG/0.5ML Pen, Inject 15 mg into the skin once a week., Disp: 2 mL, Rfl: 3   valsartan (DIOVAN) 160 MG tablet, TAKE 1 TABLET(160 MG) BY MOUTH DAILY, Disp: 90 tablet, Rfl: 1   tirzepatide (MOUNJARO) 12.5 MG/0.5ML Pen, Inject 12.5 mg into the skin once a week., Disp: 2 mL, Rfl: 3   Allergies  Allergen Reactions   Sulfa Antibiotics Hives     Review of Systems  Constitutional: Negative.   Eyes:  Negative for blurred vision.  Respiratory: Negative.  Negative for shortness of breath.  Cardiovascular: Negative.  Negative for chest pain and palpitations.  Endocrine: Negative for polydipsia, polyphagia and polyuria.  Neurological: Negative.   Psychiatric/Behavioral: Negative.       Today's Vitals   11/05/21 1608  BP: 124/80  Pulse: 94  Temp: 98.3 F (36.8 C)  Weight: 205 lb 3.2 oz (93.1 kg)  Height: 5' 2.4" (1.585 m)  PainSc: 0-No pain   Body mass index is 37.05 kg/m.  Wt Readings from Last 3 Encounters:  11/05/21 205 lb 3.2 oz (93.1 kg)  08/06/21 216 lb 6.4 oz (98.2 kg)  05/24/21 218 lb 12.8 oz (99.2 kg)     Objective:   Physical Exam Vitals and nursing note reviewed.  Constitutional:      Appearance: Normal appearance. She is obese.  HENT:     Head: Normocephalic and atraumatic.  Eyes:     Extraocular Movements: Extraocular movements intact.  Cardiovascular:     Rate and Rhythm: Normal rate and regular rhythm.     Heart sounds: Normal heart sounds.  Pulmonary:     Effort: Pulmonary effort is normal.     Breath sounds: Normal breath sounds.  Musculoskeletal:     Cervical back: Normal range of motion.  Skin:    General: Skin is warm.  Neurological:     General: No focal deficit present.     Mental Status: She is alert.  Psychiatric:        Mood and Affect: Mood normal.        Behavior: Behavior normal.      Assessment And Plan:     1. Type 2 diabetes mellitus with hypercholesterolemia ([place]) Comments: Chronic, I will check labs as below. I will send rx Mounjaro 29m weekly. If not available, she agrees to c/w Mounjaro 12.571mweekly. F/u in 3 months. - Hemoglobin A1c - BMP8+EGFR  2. Essential hypertension, benign Comments: Chronic, well controlled on valsartan. Encouraged to follow low sodium diet.   3. Class 2 severe obesity due to excess calories with serious comorbidity and body mass index (BMI) of 37.0 to 37.9 in adult [facility] She was congratulated on her 11lb weight loss since April 2023.    Patient was given opportunity to ask questions. Patient verbalized understanding of the plan and was able to repeat key elements of the plan. All questions were answered to their satisfaction.   I, RoMaximino GreenlandMD, have reviewed all documentation for this visit. The documentation on 11/05/21 for the exam, diagnosis, procedures, and orders are all accurate and complete.   IF YOU HAVE BEEN REFERRED TO A SPECIALIST, IT MAY TAKE 1-2 WEEKS TO SCHEDULE/PROCESS THE REFERRAL. IF YOU HAVE NOT HEARD FROM US/SPECIALIST IN TWO WEEKS, PLEASE GIVE [place] CALL AT [phone].   THE PATIENT IS  ENCOURAGED TO PRACTICE SOCIAL DISTANCING DUE TO THE COVID-19 PANDEMIC.

## 2021-11-21 ENCOUNTER — Other Ambulatory Visit: Payer: BC Managed Care – PPO

## 2021-11-21 LAB — BMP8+EGFR
BUN/Creatinine Ratio: 18 (ref 9–23)
BUN: 15 mg/dL (ref 6–24)
CO2: 23 mmol/L (ref 20–29)
Calcium: 9.3 mg/dL (ref 8.7–10.2)
Chloride: 106 mmol/L (ref 96–106)
Creatinine, Ser: 0.85 mg/dL (ref 0.57–1.00)
Glucose: 85 mg/dL (ref 70–99)
Potassium: 4.4 mmol/L (ref 3.5–5.2)
Sodium: 144 mmol/L (ref 134–144)
eGFR: 83 mL/min/{1.73_m2} (ref >59)

## 2021-11-21 LAB — HEMOGLOBIN A1C
Est. average glucose Bld gHb Est-mCnc: 120 mg/dL
Hgb A1c MFr Bld: 5.8 % — ABNORMAL HIGH (ref 4.8–5.6)

## 2021-11-30 ENCOUNTER — Other Ambulatory Visit (HOSPITAL_COMMUNITY): Payer: Self-pay

## 2021-12-04 LAB — HM DIABETES EYE EXAM

## 2021-12-18 ENCOUNTER — Other Ambulatory Visit (HOSPITAL_COMMUNITY): Payer: Self-pay

## 2021-12-21 ENCOUNTER — Other Ambulatory Visit (HOSPITAL_COMMUNITY): Payer: Self-pay

## 2021-12-24 ENCOUNTER — Other Ambulatory Visit (HOSPITAL_COMMUNITY): Payer: Self-pay

## 2022-01-14 ENCOUNTER — Other Ambulatory Visit (HOSPITAL_COMMUNITY): Payer: Self-pay

## 2022-01-29 ENCOUNTER — Other Ambulatory Visit: Payer: Self-pay | Admitting: Internal Medicine

## 2022-01-29 DIAGNOSIS — Z1231 Encounter for screening mammogram for malignant neoplasm of breast: Secondary | ICD-10-CM

## 2022-02-11 ENCOUNTER — Other Ambulatory Visit (HOSPITAL_COMMUNITY): Payer: Self-pay

## 2022-02-11 ENCOUNTER — Ambulatory Visit: Payer: BC Managed Care – PPO | Admitting: Internal Medicine

## 2022-02-11 ENCOUNTER — Encounter: Payer: Self-pay | Admitting: Internal Medicine

## 2022-02-11 VITALS — BP 130/68 | HR 70 | Temp 98.4°F | Ht 62.0 in | Wt 196.8 lb

## 2022-02-11 DIAGNOSIS — I10 Essential (primary) hypertension: Secondary | ICD-10-CM

## 2022-02-11 DIAGNOSIS — Z23 Encounter for immunization: Secondary | ICD-10-CM

## 2022-02-11 DIAGNOSIS — M25512 Pain in left shoulder: Secondary | ICD-10-CM | POA: Diagnosis not present

## 2022-02-11 NOTE — Progress Notes (Signed)
Barnet Glasgow Martin,acting as a Education administrator for Maximino Greenland, MD.,have documented all relevant documentation on the behalf of Maximino Greenland, MD,as directed by  Maximino Greenland, MD while in the presence of Maximino Greenland, MD.    Subjective:     Patient ID: Sue Bell , female    DOB: 08-Jan-1971 , 51 y.o.   MRN: [id number]   Chief Complaint  Patient presents with   Diabetes   Hypertension    HPI  Patient presents today for a BP check, patient states compliance with medications. Patient states her left shoulder is giving her trouble, she states she can hardly lift it over her head.   BP Readings from Last 3 Encounters: 02/11/22 : 130/68 11/05/21 : 124/80 08/06/21 : 120/78    Hypertension This is a chronic problem. The current episode started more than 1 year ago. The problem has been gradually improving since onset. The problem is controlled. Pertinent negatives include no blurred vision, chest pain, headaches, palpitations or shortness of breath. Risk factors for coronary artery disease include obesity, diabetes mellitus and dyslipidemia. Past treatments include angiotensin blockers. The current treatment provides moderate improvement. Compliance problems include exercise.      Past Medical History:  Diagnosis Date   Diabetes mellitus without complication (HCC)    Hyperlipidemia    Hypertension    OSA (obstructive sleep apnea) 07/08/2016     Family History  Problem Relation Age of Onset   Heart disease Mother    Diabetes Father    Cancer Father    Breast cancer Sister      Current Outpatient Medications:    Calcium-Magnesium-Vitamin D (CALCIUM 500 PO), Take by mouth., Disp: , Rfl:    Chlorphen-PE-Acetaminophen (NOREL AD) 4-10-325 MG TABS, Take 1 tablet by mouth 2 (two) times daily as needed., Disp: 20 tablet, Rfl: 0   DAYVIGO 10 MG TABS, TAKE 1 TABLET BY MOUTH AT BEDTIME AS NEEDED, Disp: 30 tablet, Rfl: 2   levocetirizine (XYZAL) 5 MG tablet, TAKE 1 TABLET BY MOUTH  EVERY EVENING, Disp: 90 tablet, Rfl: 2   mometasone (NASONEX) 50 MCG/ACT nasal spray, Place 1 spray into the nose daily., Disp: 17 g, Rfl: 3   Naftifine HCl 2 % GEL, Apply topically to affected area daily prn, Disp: 60 g, Rfl: 0   nystatin powder, Apply 1 application topically 3 (three) times daily. prn, Disp: 30 g, Rfl: 0   tirzepatide (MOUNJARO) 15 MG/0.5ML Pen, Inject 15 mg into the skin once a week., Disp: 2 mL, Rfl: 3   valsartan (DIOVAN) 160 MG tablet, TAKE 1 TABLET(160 MG) BY MOUTH DAILY, Disp: 90 tablet, Rfl: 1   tirzepatide (MOUNJARO) 12.5 MG/0.5ML Pen, Inject 12.5 mg into the skin once a week. (Patient not taking: Reported on 02/11/2022), Disp: 2 mL, Rfl: 3   Allergies  Allergen Reactions   Sulfa Antibiotics Hives     Review of Systems  Constitutional: Negative.   HENT: Negative.    Eyes: Negative.  Negative for blurred vision.  Respiratory: Negative.  Negative for shortness of breath.   Cardiovascular: Negative.  Negative for chest pain and palpitations.  Gastrointestinal: Negative.   Musculoskeletal:  Positive for arthralgias.       She c/o L shoulder pain. She reports she fell in August 2023. She was in the midst of moving into her new home. She slipped on some water and fell onto her left side. Since this time, she has had decreased ROM and pain with movement. Described  as a dull, sometimes sharp pain. She has not sought any medical attention since this injury. Unfortunately, her sx have not improved. She denies having LUE paresthesias and weakness.   Neurological:  Negative for headaches.     Today's Vitals   02/11/22 1548  BP: 130/68  Pulse: 70  Temp: 98.4 F (36.9 C)  TempSrc: Oral  Weight: 196 lb 12.8 oz (89.3 kg)  Height: 5\' 2"  (1.575 m)  PainSc: 6   PainLoc: Shoulder   Body mass index is 36 kg/m.  Wt Readings from Last 3 Encounters:  02/11/22 196 lb 12.8 oz (89.3 kg)  11/05/21 205 lb 3.2 oz (93.1 kg)  08/06/21 216 lb 6.4 oz (98.2 kg)    Objective:   Physical Exam Vitals and nursing note reviewed.  Constitutional:      Appearance: Normal appearance.  HENT:     Head: Normocephalic and atraumatic.     Nose:     Comments: Masked     Mouth/Throat:     Comments: Masked  Eyes:     Extraocular Movements: Extraocular movements intact.  Cardiovascular:     Rate and Rhythm: Normal rate and regular rhythm.     Heart sounds: Normal heart sounds.  Pulmonary:     Effort: Pulmonary effort is normal.     Breath sounds: Normal breath sounds.  Musculoskeletal:        General: Tenderness present.     Cervical back: Normal range of motion.     Comments: Decreased ROM LUE  Skin:    General: Skin is warm.  Neurological:     General: No focal deficit present.     Mental Status: She is alert.  Psychiatric:        Mood and Affect: Mood normal.        Behavior: Behavior normal.      Assessment And Plan:     1. Essential hypertension, benign Comments: Chronic, fair control. She is aware goal BP<120/80. She is encouraged to incorporate more exercise into her daily routine.   2. Acute pain of left shoulder Comments: She is advised to apply topical pain cream to affected area BID-TID prn. I will aso refer her to Ortho for further evaluation/radiographic studies.  - Ambulatory referral to Orthopedic Surgery  3. Need for influenza vaccination - Flu Vaccine QUAD 6+ mos PF IM (Fluarix Quad PF)     Patient was given opportunity to ask questions. Patient verbalized understanding of the plan and was able to repeat key elements of the plan. All questions were answered to their satisfaction.   I, 08/08/21, MD, have reviewed all documentation for this visit. The documentation on 02/11/22 for the exam, diagnosis, procedures, and orders are all accurate and complete.   IF YOU HAVE BEEN REFERRED TO A SPECIALIST, IT MAY TAKE 1-2 WEEKS TO SCHEDULE/PROCESS THE REFERRAL. IF YOU HAVE NOT HEARD FROM US/SPECIALIST IN TWO WEEKS, PLEASE GIVE 02/13/22 A CALL AT  [phone].   THE PATIENT IS ENCOURAGED TO PRACTICE SOCIAL DISTANCING DUE TO THE COVID-19 PANDEMIC.

## 2022-02-11 NOTE — Patient Instructions (Addendum)
[facility]  Diabetes Mellitus and Nutrition, Adult When you have diabetes, or diabetes mellitus, it is very important to have healthy eating habits because your blood sugar (glucose) levels are greatly affected by what you eat and drink. Eating healthy foods in the right amounts, at about the same times every day, can help you: Manage your blood glucose. Lower your risk of heart disease. Improve your blood pressure. Reach or maintain a healthy weight. What can affect my meal plan? Every person with diabetes is different, and each person has different needs for a meal plan. Your health care provider may recommend that you work with a dietitian to make a meal plan that is best for you. Your meal plan may vary depending on factors such as: The calories you need. The medicines you take. Your weight. Your blood glucose, blood pressure, and cholesterol levels. Your activity level. Other health conditions you have, such as heart or kidney disease. How do carbohydrates affect me? Carbohydrates, also called carbs, affect your blood glucose level more than any other type of food. Eating carbs raises the amount of glucose in your blood. It is important to know how many carbs you can safely have in each meal. This is different for every person. Your dietitian can help you calculate how many carbs you should have at each meal and for each snack. How does alcohol affect me? Alcohol can cause a decrease in blood glucose (hypoglycemia), especially if you use insulin or take certain diabetes medicines by mouth. Hypoglycemia can be a life-threatening condition. Symptoms of hypoglycemia, such as sleepiness, dizziness, and confusion, are similar to symptoms of having too much alcohol. Do not drink alcohol if: Your health care provider tells you not to drink. You are pregnant, may be pregnant, or are planning to become pregnant. If you drink alcohol: Limit how much you  have to: 0-1 drink a day for women. 0-2 drinks a day for men. Know how much alcohol is in your drink. In the [place] one drink equals one 12 oz bottle of beer (355 mL), one 5 oz glass of wine (148 mL), or one 1 oz glass of hard liquor (44 mL). Keep yourself hydrated with water, diet soda, or unsweetened iced tea. Keep in mind that regular soda, juice, and other mixers may contain a lot of sugar and must be counted as carbs. What are tips for following this plan?  Reading food labels Start by checking the serving size on the Nutrition Facts label of packaged foods and drinks. The number of calories and the amount of carbs, fats, and other nutrients listed on the label are based on one serving of the item. Many items contain more than one serving per package. Check the total grams (g) of carbs in one serving. Check the number of grams of saturated fats and trans fats in one serving. Choose foods that have a low amount or none of these fats. Check the number of milligrams (mg) of salt (sodium) in one serving. Most people should limit total sodium intake to less than 2,300 mg per day. Always check the nutrition information of foods labeled as "low-fat" or "nonfat." These foods may be higher in added sugar or refined carbs and should be avoided. Talk to your dietitian to identify your daily goals for nutrients listed on the label. Shopping Avoid buying canned, pre-made, or processed foods. These foods tend to be high in fat, sodium, and added sugar. Shop around the outside edge of  the grocery store. This is where you will most often find fresh fruits and vegetables, bulk grains, fresh meats, and fresh dairy products. Cooking Use low-heat cooking methods, such as baking, instead of high-heat cooking methods, such as deep frying. Cook using healthy oils, such as olive, canola, or sunflower oil. Avoid cooking with butter, cream, or high-fat meats. Meal planning Eat meals and snacks regularly, preferably  at the same times every day. Avoid going long periods of time without eating. Eat foods that are high in fiber, such as fresh fruits, vegetables, beans, and whole grains. Eat 4-6 oz (112-168 g) of lean protein each day, such as lean meat, chicken, fish, eggs, or tofu. One ounce (oz) (28 g) of lean protein is equal to: 1 oz (28 g) of meat, chicken, or fish. 1 egg.  cup (62 g) of tofu. Eat some foods each day that contain healthy fats, such as avocado, nuts, seeds, and fish. What foods should I eat? Fruits Berries. Apples. Oranges. Peaches. Apricots. Plums. Grapes. Mangoes. Papayas. Pomegranates. Kiwi. Cherries. Vegetables Leafy greens, including lettuce, spinach, kale, chard, collard greens, mustard greens, and cabbage. Beets. Cauliflower. Broccoli. Carrots. Green beans. Tomatoes. Peppers. Onions. Cucumbers. Brussels sprouts. Grains Whole grains, such as whole-wheat or whole-grain bread, crackers, tortillas, cereal, and pasta. Unsweetened oatmeal. Quinoa. Brown or wild rice. Meats and other proteins Seafood. Poultry without skin. Lean cuts of poultry and beef. Tofu. Nuts. Seeds. Dairy Low-fat or fat-free dairy products such as milk, yogurt, and cheese. The items listed above may not be a complete list of foods and beverages you can eat and drink. Contact a dietitian for more information. What foods should I avoid? Fruits Fruits canned with syrup. Vegetables Canned vegetables. Frozen vegetables with butter or cream sauce. Grains Refined white flour and flour products such as bread, pasta, snack foods, and cereals. Avoid all processed foods. Meats and other proteins Fatty cuts of meat. Poultry with skin. Breaded or fried meats. Processed meat. Avoid saturated fats. Dairy Full-fat yogurt, cheese, or milk. Beverages Sweetened drinks, such as soda or iced tea. The items listed above may not be a complete list of foods and beverages you should avoid. Contact a dietitian for more  information. Questions to ask a health care provider Do I need to meet with a certified diabetes care and education specialist? Do I need to meet with a dietitian? What number can I call if I have questions? When are the best times to check my blood glucose? Where to find more information: American Diabetes Association: diabetes.org Academy of Nutrition and Dietetics: eatright.[employer] of Diabetes and Digestive and Kidney Diseases: [employer]is Association of Diabetes Care & Education Specialists: diabeteseducator.org Summary It is important to have healthy eating habits because your blood sugar (glucose) levels are greatly affected by what you eat and drink. It is important to use alcohol carefully. A healthy meal plan will help you manage your blood glucose and lower your risk of heart disease. Your health care provider may recommend that you work with a dietitian to make a meal plan that is best for you. This information is not intended to replace advice given to you by your health care provider. Make sure you discuss any questions you have with your health care provider. Document Revised: 11/03/2019 Document Reviewed: 11/03/2019 Elsevier Patient Education  [place].

## 2022-02-18 ENCOUNTER — Ambulatory Visit: Payer: Self-pay

## 2022-02-18 ENCOUNTER — Encounter: Payer: Self-pay | Admitting: Orthopedic Surgery

## 2022-02-18 ENCOUNTER — Ambulatory Visit: Payer: BC Managed Care – PPO | Admitting: Orthopedic Surgery

## 2022-02-18 ENCOUNTER — Ambulatory Visit (INDEPENDENT_AMBULATORY_CARE_PROVIDER_SITE_OTHER): Payer: BC Managed Care – PPO

## 2022-02-18 DIAGNOSIS — M7502 Adhesive capsulitis of left shoulder: Secondary | ICD-10-CM

## 2022-02-18 DIAGNOSIS — M19012 Primary osteoarthritis, left shoulder: Secondary | ICD-10-CM

## 2022-02-18 DIAGNOSIS — M25512 Pain in left shoulder: Secondary | ICD-10-CM

## 2022-02-18 MED ORDER — LIDOCAINE HCL 1 % IJ SOLN
5.0000 mL | INTRAMUSCULAR | Status: AC | PRN
  Administered 2022-02-18: 5 mL

## 2022-02-18 MED ORDER — BUPIVACAINE HCL 0.5 % IJ SOLN
9.0000 mL | INTRAMUSCULAR | Status: AC | PRN
  Administered 2022-02-18: 9 mL via INTRA_ARTICULAR

## 2022-02-18 MED ORDER — LIDOCAINE HCL 1 % IJ SOLN
3.0000 mL | INTRAMUSCULAR | Status: AC | PRN
  Administered 2022-02-18: 3 mL

## 2022-02-18 MED ORDER — BUPIVACAINE HCL 0.25 % IJ SOLN
0.6600 mL | INTRAMUSCULAR | Status: AC | PRN
  Administered 2022-02-18: .66 mL via INTRA_ARTICULAR

## 2022-02-18 MED ORDER — METHYLPREDNISOLONE ACETATE 40 MG/ML IJ SUSP
40.0000 mg | INTRAMUSCULAR | Status: AC | PRN
  Administered 2022-02-18: 40 mg via INTRA_ARTICULAR

## 2022-02-18 MED ORDER — METHYLPREDNISOLONE ACETATE 40 MG/ML IJ SUSP
13.3300 mg | INTRAMUSCULAR | Status: AC | PRN
  Administered 2022-02-18: 13.33 mg via INTRA_ARTICULAR

## 2022-02-18 NOTE — Progress Notes (Signed)
Office Visit Note   Patient: Sue Bell           Date of Birth: June 21, 1970           MRN: [id number] Visit Date: 02/18/2022 Requested by: Glendale Chard, [address] [place],  [place] [address] PCP: Glendale Chard, MD  Subjective: Chief Complaint  Patient presents with   Left Shoulder - Pain    HPI: Sue Bell is a 51 y.o. female who presents to the office reporting left shoulder pain.  She describes pain for 2 and half months following a fall where she landed directly on the left shoulder.  She is right-hand dominant.  Denies any grinding or catching.  She does report some tightness in the shoulder.  No numbness and tingling and no neck pain but the pain is waking her sleep at night.  She had gastric bypass surgery in 2018.  Taking Tylenol with some relief.  Works as an Scientist, physiological.  Likes to read and travel.  Does not do too much in terms of sports.  Has some pain when reaching overhead.  Hard for her to sleep on the left-hand side.  Denies any new mechanical symptoms.  Does hurt for her to extend the left arm.  She noticed symptoms were worsening when she was recently on a flight and had to do a lot of overhead activities on the plane.  Denies any weakness..                ROS: All systems reviewed are negative as they relate to the chief complaint within the history of present illness.  Patient denies fevers or chills.  Assessment & Plan: Visit Diagnoses:  1. Left shoulder pain, unspecified chronicity     Plan: Impression is left shoulder pain with possible component of early frozen shoulder and or AC joint symptomatic inflammation and arthritis.  Plan at this time is ultrasound-guided injection into the [facility] joint as well as into the glenohumeral joint.  Continue with stretching and range of motion exercises.  Follow-up in 6 weeks for clinical recheck and decision for or against imaging studies at that time including MRI scan.  Not too much weakness today.   Does have a little bit of loss of passive range of motion on the left.  Follow-Up Instructions: No follow-ups on file.   Orders:  Orders Placed This Encounter  Procedures   XR Shoulder Left   US Guided Needle Placement - No Linked Charges   No orders of the defined types were placed in this encounter.     Procedures: Large Joint Inj: L glenohumeral on 02/18/2022 10:26 PM Indications: diagnostic evaluation and pain Details: 18 G 1.5 in needle, ultrasound-guided posterior approach  Arthrogram: No  Medications: 9 mL bupivacaine 0.5 %; 40 mg methylPREDNISolone acetate 40 MG/ML; 5 mL lidocaine 1 % Outcome: tolerated well, no immediate complications Procedure, treatment alternatives, risks and benefits explained, specific risks discussed. Consent was given by the patient. Immediately prior to procedure a time out was called to verify the correct patient, procedure, equipment, support staff and site/side marked as required. Patient was prepped and draped in the usual sterile fashion.    Medium Joint Inj: L acromioclavicular on 02/18/2022 10:26 PM Indications: pain and diagnostic evaluation Details: 27 G 1.5 in needle, superior approach Medications: 13.33 mg methylPREDNISolone acetate 40 MG/ML; 0.66 mL bupivacaine 0.25 %; 3 mL lidocaine 1 % Outcome: tolerated well, no immediate complications Procedure, treatment alternatives, risks and benefits  explained, specific risks discussed. Consent was given by the patient. Immediately prior to procedure a time out was called to verify the correct patient, procedure, equipment, support staff and site/side marked as required. Patient was prepped and draped in the usual sterile fashion.       Clinical Data: No additional findings.  Objective: Vital Signs: There were no vitals taken for this visit.  Physical Exam:  Constitutional: Patient appears well-developed HEENT:  Head: Normocephalic Eyes:EOM are normal Neck: Normal range of  motion Cardiovascular: Normal rate Pulmonary/chest: Effort normal Neurologic: Patient is alert Skin: Skin is warm Psychiatric: Patient has normal mood and affect  Ortho Exam: Ortho exam demonstrates passive range of motion of the left of 60/90/150.  On the right her motion is 80/110/175.  Rotator cuff strength intact bilaterally to infraspinatus supraspinatus and subscap muscle testing.  Has mild AC joint tenderness on the left compared to the right.  Some pain with crossarm adduction is present.  No coarse grinding or crepitus present in the right shoulder region with internal and external rotation of the arm at 90 degrees of abduction.  Deltoid fires.  Specialty Comments:  No specialty comments available.  Imaging: XR Shoulder Left  Result Date: 02/18/2022 AP lateral axillary radiographs left shoulder reviewed.  No acute fracture.  Acromiohumeral distance normal.  Mild AC joint degenerative changes present.  No glenohumeral joint degenerative changes present.  Shoulder is located.  Visualized lung fields clear.  US Guided Needle Placement - No Linked Charges  Result Date: 02/18/2022 Ultrasound imaging demonstrates needle placement into the glenohumeral joint with extravasation of fluid and no complicating features on the left-hand side    PMFS History: Patient Active Problem List   Diagnosis Date Noted   Type 2 diabetes mellitus with hypercholesterolemia ([place]) 05/24/2021   Primary insomnia 05/24/2020   Class 3 severe obesity due to excess calories with serious comorbidity and body mass index (BMI) of 40.0 to 44.9 in adult ([place]) 05/24/2020   Essential hypertension, benign 06/24/2019   Diabetes mellitus without complication ([place]) [id number]   Seasonal allergic rhinitis due to pollen 06/24/2018   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 10/17/2016   Class 2 severe obesity with body mass index (BMI) of 35 to 39.9 with serious comorbidity ([place]) 10/15/2016   OSA (obstructive sleep apnea)  07/08/2016   S/P hysterectomy-2005 secondary to fibroids and adenomyosis 02/05/2012   Past Medical History:  Diagnosis Date   Diabetes mellitus without complication (HCC)    Hyperlipidemia    Hypertension    OSA (obstructive sleep apnea) 07/08/2016    Family History  Problem Relation Age of Onset   Heart disease Mother    Diabetes Father    Cancer Father    Breast cancer Sister     Past Surgical History:  Procedure Laterality Date   BREAST REDUCTION SURGERY  2003   CESAREAN SECTION     GASTRIC ROUX-EN-Y N/A 10/15/2016   Procedure: LAPAROSCOPIC ROUX-EN-Y GASTRIC BYPASS WITH UPPER ENDOSCOPY;  Surgeon: Clovis Riley, MD;  Location: [facility];  Service: General;  Laterality: N/A;   PARTIAL HYSTERECTOMY  2005   REDUCTION MAMMAPLASTY Bilateral 2003   Social History   Occupational History   Occupation: Optician, dispensing  Tobacco Use   Smoking status: Never   Smokeless tobacco: Never  Vaping Use   Vaping Use: Never used  Substance and Sexual Activity   Alcohol use: No   Drug use: No   Sexual activity: Yes    Birth control/protection:  None    Comment: hysterectomy

## 2022-03-12 ENCOUNTER — Other Ambulatory Visit (HOSPITAL_COMMUNITY): Payer: Self-pay

## 2022-03-19 ENCOUNTER — Other Ambulatory Visit (HOSPITAL_COMMUNITY): Payer: Self-pay

## 2022-03-20 ENCOUNTER — Ambulatory Visit
Admission: RE | Admit: 2022-03-20 | Discharge: 2022-03-20 | Disposition: A | Discharge status: Home / Self Care | Payer: BC Managed Care – PPO | Source: Ambulatory Visit | Attending: Internal Medicine | Admitting: Internal Medicine

## 2022-03-20 ENCOUNTER — Encounter: Payer: Self-pay | Admitting: Internal Medicine

## 2022-03-20 DIAGNOSIS — Z1231 Encounter for screening mammogram for malignant neoplasm of breast: Secondary | ICD-10-CM

## 2022-03-21 ENCOUNTER — Other Ambulatory Visit (HOSPITAL_COMMUNITY): Payer: Self-pay

## 2022-03-21 ENCOUNTER — Other Ambulatory Visit: Payer: Self-pay | Admitting: Internal Medicine

## 2022-03-21 ENCOUNTER — Encounter: Payer: Self-pay | Admitting: Internal Medicine

## 2022-03-21 DIAGNOSIS — F5101 Primary insomnia: Secondary | ICD-10-CM

## 2022-03-21 MED ORDER — DAYVIGO 10 MG PO TABS
1.0000 | ORAL_TABLET | Freq: Every evening | ORAL | 2 refills | Status: DC | PRN
  Filled 2022-03-21: qty 30, 30d supply, fill #0
  Filled 2022-04-21: qty 30, 30d supply, fill #1

## 2022-03-21 MED ORDER — ATORVASTATIN CALCIUM 40 MG PO TABS
40.0000 mg | ORAL_TABLET | Freq: Every day | ORAL | 11 refills | Status: DC
  Filled 2022-03-21: qty 30, 30d supply, fill #0
  Filled 2022-05-20: qty 30, 30d supply, fill #1
  Filled 2022-06-16: qty 30, 30d supply, fill #2
  Filled 2022-08-15: qty 30, 30d supply, fill #3
  Filled 2022-10-14: qty 30, 30d supply, fill #4
  Filled 2022-11-17: qty 30, 30d supply, fill #5

## 2022-04-01 ENCOUNTER — Ambulatory Visit: Payer: BC Managed Care – PPO | Admitting: Orthopedic Surgery

## 2022-04-01 ENCOUNTER — Ambulatory Visit: Payer: BC Managed Care – PPO | Admitting: Internal Medicine

## 2022-04-09 ENCOUNTER — Other Ambulatory Visit: Payer: Self-pay | Admitting: Internal Medicine

## 2022-04-10 ENCOUNTER — Other Ambulatory Visit (HOSPITAL_COMMUNITY): Payer: Self-pay

## 2022-04-10 MED ORDER — MOUNJARO 15 MG/0.5ML ~~LOC~~ SOAJ
15.0000 mg | SUBCUTANEOUS | 3 refills | Status: DC
  Filled 2022-04-10: qty 2, 28d supply, fill #0
  Filled 2022-05-06: qty 2, 28d supply, fill #1

## 2022-04-16 ENCOUNTER — Other Ambulatory Visit (HOSPITAL_COMMUNITY): Payer: Self-pay

## 2022-04-22 ENCOUNTER — Other Ambulatory Visit (HOSPITAL_COMMUNITY): Payer: Self-pay

## 2022-04-23 ENCOUNTER — Other Ambulatory Visit: Payer: Self-pay

## 2022-04-24 ENCOUNTER — Encounter: Payer: Self-pay | Admitting: Orthopedic Surgery

## 2022-04-24 ENCOUNTER — Ambulatory Visit: Payer: BC Managed Care – PPO | Admitting: Orthopedic Surgery

## 2022-04-24 DIAGNOSIS — M7502 Adhesive capsulitis of left shoulder: Secondary | ICD-10-CM | POA: Diagnosis not present

## 2022-04-24 NOTE — Progress Notes (Signed)
Office Visit Note   Patient: Sue Bell           Date of Birth: 1970-10-19           MRN: [id number] Visit Date: 04/24/2022 Requested by: Glendale Chard, [address] [place],  [place] [address] PCP: Glendale Chard, MD  Subjective: Chief Complaint  Patient presents with   Left Shoulder - Pain, Follow-up    HPI: REETA KUK is a 52 y.o. female who presents to the office reporting left shoulder pain.  Patient had left shoulder glenohumeral joint injection 6 weeks ago.  Symptoms are improving.  Not really having too much problems at this time.  Overall she say is her range of motion is significantly better.  Sleeping well.  Not taking any medications.  She is working without the shoulder being a hindrance..                ROS: All systems reviewed are negative as they relate to the chief complaint within the history of present illness.  Patient denies fevers or chills.  Assessment & Plan: Visit Diagnoses:  1. Adhesive capsulitis of left shoulder     Plan: Impression is improved shoulder adhesive capsulitis with near normal range of motion and great function at this time.  Follow-up as needed if symptoms start to recur.  Follow-Up Instructions: No follow-ups on file.   Orders:  No orders of the defined types were placed in this encounter.  No orders of the defined types were placed in this encounter.     Procedures: No procedures performed   Clinical Data: No additional findings.  Objective: Vital Signs: There were no vitals taken for this visit.  Physical Exam:  Constitutional: Patient appears well-developed HEENT:  Head: Normocephalic Eyes:EOM are normal Neck: Normal range of motion Cardiovascular: Normal rate Pulmonary/chest: Effort normal Neurologic: Patient is alert Skin: Skin is warm Psychiatric: Patient has normal mood and affect  Ortho Exam: Ortho exam demonstrates passive range of motion on the left of 70/110/170 with good  rotator cuff strength.  No real coarse grinding or crepitus with internal/external [place] of the arm.  Specialty Comments:  No specialty comments available.  Imaging: No results found.   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 10/17/2016   Class 2 severe obesity with body mass index (BMI) of 35 to 39.9 with serious comorbidity ([place]) 10/15/2016   OSA (obstructive sleep apnea) 07/08/2016   S/P hysterectomy-2005 secondary to fibroids and adenomyosis 02/05/2012   Past Medical History:  Diagnosis Date   Diabetes mellitus without complication (HCC)    Hyperlipidemia    Hypertension    OSA (obstructive sleep apnea) 07/08/2016    Family History  Problem Relation Age of Onset   Heart disease Mother    Diabetes Father    Cancer Father    Breast cancer Sister     Past Surgical History:  Procedure Laterality Date   BREAST REDUCTION SURGERY  2003   CESAREAN SECTION     GASTRIC ROUX-EN-Y N/A 10/15/2016  Procedure: LAPAROSCOPIC ROUX-EN-Y GASTRIC BYPASS WITH UPPER ENDOSCOPY;  Surgeon: Clovis Riley, MD;  Location: [facility];  Service: General;  Laterality: N/A;   PARTIAL HYSTERECTOMY  2005   REDUCTION MAMMAPLASTY Bilateral 2003   Social History   Occupational History   Occupation: Optician, dispensing  Tobacco Use   Smoking status: Never   Smokeless tobacco: Never  Vaping Use   Vaping Use: Never used  Substance and Sexual Activity   Alcohol use: No   Drug use: No   Sexual activity: Yes    Birth control/protection: None     Comment: hysterectomy

## 2022-05-10 ENCOUNTER — Encounter (HOSPITAL_COMMUNITY): Payer: Self-pay | Admitting: *Deleted

## 2022-05-15 ENCOUNTER — Other Ambulatory Visit (HOSPITAL_COMMUNITY): Payer: Self-pay

## 2022-05-15 ENCOUNTER — Ambulatory Visit: Payer: BC Managed Care – PPO | Admitting: Internal Medicine

## 2022-05-15 ENCOUNTER — Encounter: Payer: Self-pay | Admitting: Internal Medicine

## 2022-05-15 VITALS — BP 118/78 | HR 89 | Temp 97.9°F | Ht 62.0 in | Wt 187.6 lb

## 2022-05-15 DIAGNOSIS — I10 Essential (primary) hypertension: Secondary | ICD-10-CM

## 2022-05-15 DIAGNOSIS — E7800 Pure hypercholesterolemia, unspecified: Secondary | ICD-10-CM | POA: Diagnosis not present

## 2022-05-15 DIAGNOSIS — E1169 Type 2 diabetes mellitus with other specified complication: Secondary | ICD-10-CM | POA: Diagnosis not present

## 2022-05-15 DIAGNOSIS — Z23 Encounter for immunization: Secondary | ICD-10-CM

## 2022-05-15 DIAGNOSIS — F5101 Primary insomnia: Secondary | ICD-10-CM

## 2022-05-15 DIAGNOSIS — Z6834 Body mass index (BMI) 34.0-34.9, adult: Secondary | ICD-10-CM

## 2022-05-15 DIAGNOSIS — E6609 Other obesity due to excess calories: Secondary | ICD-10-CM

## 2022-05-15 LAB — POCT URINALYSIS DIPSTICK
Bilirubin, UA: NEGATIVE
Glucose, UA: NEGATIVE
Ketones, UA: NEGATIVE
Nitrite, UA: NEGATIVE
Protein, UA: NEGATIVE
Spec Grav, UA: 1.025 (ref 1.010–1.025)
Urobilinogen, UA: 0.2 E.U./dL
pH, UA: 5.5 (ref 5.0–8.0)

## 2022-05-15 MED ORDER — VALSARTAN 160 MG PO TABS
160.0000 mg | ORAL_TABLET | Freq: Every day | ORAL | 1 refills | Status: DC
  Filled 2022-05-15 – 2022-10-14 (×2): qty 90, 90d supply, fill #0
  Filled 2023-04-29: qty 90, 90d supply, fill #1

## 2022-05-15 MED ORDER — DAYVIGO 10 MG PO TABS
1.0000 | ORAL_TABLET | Freq: Every evening | ORAL | 2 refills | Status: DC | PRN
  Filled 2022-05-15 – 2022-05-28 (×2): qty 30, 30d supply, fill #0
  Filled 2022-07-09: qty 30, 30d supply, fill #1
  Filled 2022-08-15: qty 30, 30d supply, fill #2

## 2022-05-15 MED ORDER — MOUNJARO 15 MG/0.5ML ~~LOC~~ SOAJ
15.0000 mg | SUBCUTANEOUS | 3 refills | Status: DC
  Filled 2022-05-15 – 2022-06-04 (×2): qty 2, 28d supply, fill #0
  Filled 2022-07-09: qty 2, 28d supply, fill #1

## 2022-05-15 NOTE — Progress Notes (Signed)
I,Victoria T Hamilton,acting as a scribe for Maximino Greenland, MD.,have documented all relevant documentation on the behalf of Maximino Greenland, MD,as directed by  Maximino Greenland, MD while in the presence of Maximino Greenland, MD.    Subjective:     Patient ID: Sue Bell , female    DOB: 09/11/1970 , 52 y.o.   MRN: [id number]   Chief Complaint  Patient presents with   Diabetes   Hypertension    HPI  Patient presents today for a BP & DM check, patient states compliance with medications. She would like to talk about Dayvigo, she would possibly like to increase medication.  GYN: Everett Graff. Patient states she had a hysterectomy in 2005. She has not made recent apt for pap.       Diabetes She presents for her follow-up diabetic visit. She has type 2 diabetes mellitus. Her disease course has been stable. There are no hypoglycemic associated symptoms. Pertinent negatives for hypoglycemia include no headaches. Pertinent negatives for diabetes include no blurred vision and no chest pain. There are no hypoglycemic complications. There are no diabetic complications. Risk factors for coronary artery disease include diabetes mellitus, dyslipidemia, obesity and sedentary lifestyle. She is following a diabetic diet. She participates in exercise intermittently.  Hypertension This is a chronic problem. The current episode started more than 1 year ago. The problem has been gradually improving since onset. The problem is controlled. Pertinent negatives include no blurred vision, chest pain, headaches, palpitations or shortness of breath. Risk factors for coronary artery disease include obesity, diabetes mellitus and dyslipidemia. Past treatments include angiotensin blockers. The current treatment provides moderate improvement. Compliance problems include exercise.      Past Medical History:  Diagnosis Date   Diabetes mellitus without complication (HCC)    Hyperlipidemia    Hypertension    OSA  (obstructive sleep apnea) 07/08/2016     Family History  Problem Relation Age of Onset   Heart disease Mother    Diabetes Father    Cancer Father    Breast cancer Sister      Current Outpatient Medications:    atorvastatin (LIPITOR) 40 MG tablet, Take 1 tablet (40 mg total) by mouth daily., Disp: 30 tablet, Rfl: 11   Calcium-Magnesium-Vitamin D (CALCIUM 500 PO), Take by mouth., Disp: , Rfl:    Chlorphen-PE-Acetaminophen (NOREL AD) 4-10-325 MG TABS, Take 1 tablet by mouth 2 (two) times daily as needed., Disp: 20 tablet, Rfl: 0   levocetirizine (XYZAL) 5 MG tablet, TAKE 1 TABLET BY MOUTH EVERY EVENING, Disp: 90 tablet, Rfl: 2   mometasone (NASONEX) 50 MCG/ACT nasal spray, Place 1 spray into the nose daily., Disp: 17 g, Rfl: 3   Naftifine HCl 2 % GEL, Apply topically to affected area daily prn, Disp: 60 g, Rfl: 0   nystatin powder, Apply 1 application topically 3 (three) times daily. prn, Disp: 30 g, Rfl: 0   Lemborexant (DAYVIGO) 10 MG TABS, Take 1 tablet (10 mg total) by mouth at bedtime as needed., Disp: 30 tablet, Rfl: 2   tirzepatide (MOUNJARO) 15 MG/0.5ML Pen, Inject 15 mg into the skin once a week., Disp: 2 mL, Rfl: 3   valsartan (DIOVAN) 160 MG tablet, TAKE 1 TABLET(160 MG) BY MOUTH DAILY, Disp: 90 tablet, Rfl: 1   Allergies  Allergen Reactions   Sulfa Antibiotics Hives     Review of Systems  Constitutional: Negative.   Eyes:  Negative for blurred vision.  Respiratory: Negative.  Negative for shortness  of breath.   Cardiovascular: Negative.  Negative for chest pain and palpitations.  Neurological: Negative.  Negative for headaches.  Psychiatric/Behavioral: Negative.       Today's Vitals   05/15/22 1138  BP: 118/78  Pulse: 89  Temp: 97.9 F (36.6 C)  SpO2: 98%  Weight: 187 lb 9.6 oz (85.1 kg)  Height: 5' 2"$  (1.575 m)   Body mass index is 34.31 kg/m.  Wt Readings from Last 3 Encounters:  05/15/22 187 lb 9.6 oz (85.1 kg)  02/11/22 196 lb 12.8 oz (89.3 kg)   11/05/21 205 lb 3.2 oz (93.1 kg)    Objective:  Physical Exam Vitals and nursing note reviewed.  Constitutional:      Appearance: Normal appearance.  HENT:     Head: Normocephalic and atraumatic.     Nose:     Comments: Masked     Mouth/Throat:     Comments: Masked  Eyes:     Extraocular Movements: Extraocular movements intact.  Cardiovascular:     Rate and Rhythm: Normal rate and regular rhythm.     Heart sounds: Normal heart sounds.  Pulmonary:     Effort: Pulmonary effort is normal.     Breath sounds: Normal breath sounds.  Musculoskeletal:     Cervical back: Normal range of motion.  Skin:    General: Skin is warm.  Neurological:     General: No focal deficit present.     Mental Status: She is alert.  Psychiatric:        Mood and Affect: Mood normal.        Behavior: Behavior normal.       Assessment And Plan:     1. Type 2 diabetes mellitus with hypercholesterolemia (HCC) Comments: Chronic, currently on Mounjaro 25m daily. Encouraged to aim for at least 150 minutes of exercise/week. She will f/u in 3-4 months. - CMP14+EGFR - Hemoglobin A1c - POCT Urinalysis Dipstick (81002) - Microalbumin / creatinine urine ratio  2. Essential hypertension, benign Comments: Chronic, well controlled. She will c/w valsartan 1630mdaily. - valsartan (DIOVAN) 160 MG tablet; TAKE 1 TABLET(160 MG) BY MOUTH DAILY  Dispense: 90 tablet; Refill: 1  3. Primary insomnia Comments: Chronic, improved with Dayvigo 1063mightly. Encouraged to develop a bedtime routine. - Lemborexant (DAYVIGO) 10 MG TABS; Take 1 tablet (10 mg total) by mouth at bedtime as needed.  Dispense: 30 tablet; Refill: 2  4. Class 1 obesity due to excess calories without serious comorbidity with body mass index (BMI) of 34.0 to 34.9 in adult Comments: She is encouraged to aim for at least 150 minutes of exercise/week, while striving for BMI<30 to decrease cardiac risk.  5. Need for Tdap vaccination - Tdap vaccine  greater than or equal to 7yo IM  Patient was given opportunity to ask questions. Patient verbalized understanding of the plan and was able to repeat key elements of the plan. All questions were answered to their satisfaction.   I, RobMaximino GreenlandD, have reviewed all documentation for this visit. The documentation on 05/15/22 for the exam, diagnosis, procedures, and orders are all accurate and complete.   IF YOU HAVE BEEN REFERRED TO A SPECIALIST, IT MAY TAKE 1-2 WEEKS TO SCHEDULE/PROCESS THE REFERRAL. IF YOU HAVE NOT HEARD FROM US/SPECIALIST IN TWO WEEKS, PLEASE GIVE US [place] AT [phone].   THE PATIENT IS ENCOURAGED TO PRACTICE SOCIAL DISTANCING DUE TO THE COVID-19 PANDEMIC.

## 2022-05-15 NOTE — Patient Instructions (Addendum)
The 10-year ASCVD risk score (Arnett DK, et al., 2019) is: 7.4%   Values used to calculate the score:     Age: 52 years     Sex: Female     Is Non-Hispanic African American: Yes     Diabetic: Yes     Tobacco smoker: No     Systolic Blood Pressure: 161 mmHg     Is BP treated: Yes     HDL Cholesterol: 67 mg/dL     Total Cholesterol: 280 mg/dL   Type 2 Diabetes Mellitus, Diagnosis, Adult Type 2 diabetes (type 2 diabetes mellitus) is a long-term (chronic) disease. It may happen when there is one or both of these problems: The pancreas does not make enough insulin. The body does not react in a normal way to insulin that it makes. Insulin lets sugars go into cells in your body. If you have type 2 diabetes, sugars cannot get into your cells. Sugars build up in the blood. This causes high blood sugar. What are the causes? The exact cause of this condition is not known. What increases the risk? Having type 2 diabetes in your family. Being overweight or very overweight. Not being active. Your body not reacting in a normal way to the insulin it makes. Having higher than normal blood sugar over time. Having a type of diabetes when you were pregnant. Having a condition that causes small fluid-filled sacs on your ovaries. What are the signs or symptoms? At first, you may have no symptoms. You will get symptoms slowly. They may include: More thirst than normal. More hunger than normal. Needing to pee more than normal. Losing weight without trying. Feeling tired. Feeling weak. Seeing things blurry. Dark patches on your skin. How is this treated? This condition may be treated by a diabetes expert. You may need to: Follow an eating plan made by a food expert (dietitian). Get regular exercise. Find ways to deal with stress. Check blood sugar as often as told. Take medicines. Your doctor will set treatment goals for you. Your blood sugar should be at these levels: Before meals: 80-130 mg/dL  (4.4-7.2 mmol/L). After meals: below 180 mg/dL (10 mmol/L). Over the last 2-3 months: less than 7%. Follow these instructions at home: Medicines Take your diabetes medicines or insulin every day. Take medicines as told to help you prevent other problems caused by this condition. You may need: Aspirin. Medicine to lower cholesterol. Medicine to control blood pressure. Questions to ask your doctor Should I meet with a diabetes educator? What medicines do I need, and when should I take them? What will I need to treat my condition at home? When should I check my blood sugar? Where can I find a support group? Who can I call if I have questions? When is my next doctor visit? General instructions Take over-the-counter and prescription medicines only as told by your doctor. Keep all follow-up visits. Where to find more information For help and guidance and more information about diabetes, please go to: American Diabetes Association (ADA): www.diabetes.org American Association of Diabetes Care and Education Specialists (ADCES): www.diabeteseducator.org International Diabetes Federation (IDF): MemberVerification.ca Contact a doctor if: Your blood sugar is at or above 240 mg/dL (13.3 mmol/L) for 2 days in a row. You have been sick for 2 days or more, and you are not getting better. You have had a fever for 2 days or more, and you are not getting better. You have any of these problems for more than 6 hours: You  cannot eat or drink. You feel like you may vomit. You vomit. You have watery poop (diarrhea). Get help right away if: Your blood sugar is lower than 54 mg/dL (3 mmol/L). You feel mixed up (confused). You have trouble thinking clearly. You have trouble breathing. You have medium or large ketone levels in your pee. These symptoms may be an emergency. Get help right away. Call your local emergency services (911 in the [place] Do not wait to see if the symptoms will go away. Do not drive  yourself to the hospital. Summary Type 2 diabetes is a long-term disease. Your pancreas may not make enough insulin, or your body may not react in a normal way to insulin that it makes. This condition is treated with an eating plan, lifestyle changes, and medicines. Your doctor will set treatment goals for you. These will help you keep your blood sugar in a healthy range. Keep all follow-up visits. This information is not intended to replace advice given to you by your health care provider. Make sure you discuss any questions you have with your health care provider. Document Revised: 06/26/2020 Document Reviewed: 06/26/2020 Elsevier Patient Education  [place].

## 2022-05-16 ENCOUNTER — Other Ambulatory Visit: Payer: Self-pay | Admitting: Internal Medicine

## 2022-05-16 LAB — CMP14+EGFR
ALT: 11 IU/L (ref 0–32)
AST: 18 IU/L (ref 0–40)
Albumin/Globulin Ratio: 2 (ref 1.2–2.2)
Albumin: 4.9 g/dL (ref 3.8–4.9)
Alkaline Phosphatase: 93 IU/L (ref 44–121)
BUN/Creatinine Ratio: 19 (ref 9–23)
BUN: 14 mg/dL (ref 6–24)
Bilirubin Total: 0.3 mg/dL (ref 0.0–1.2)
CO2: 24 mmol/L (ref 20–29)
Calcium: 9.3 mg/dL (ref 8.7–10.2)
Chloride: 105 mmol/L (ref 96–106)
Creatinine, Ser: 0.75 mg/dL (ref 0.57–1.00)
Globulin, Total: 2.5 g/dL (ref 1.5–4.5)
Glucose: 78 mg/dL (ref 70–99)
Potassium: 4.3 mmol/L (ref 3.5–5.2)
Sodium: 144 mmol/L (ref 134–144)
Total Protein: 7.4 g/dL (ref 6.0–8.5)
eGFR: 96 mL/min/{1.73_m2} (ref >59)

## 2022-05-16 LAB — HEMOGLOBIN A1C
Est. average glucose Bld gHb Est-mCnc: 117 mg/dL
Hgb A1c MFr Bld: 5.7 % — ABNORMAL HIGH (ref 4.8–5.6)

## 2022-05-16 MED ORDER — NITROFURANTOIN MONOHYD MACRO 100 MG PO CAPS: 100.0000 mg | ORAL_CAPSULE | Freq: Two times a day (BID) | ORAL | 0 refills | Status: AC

## 2022-05-17 LAB — MICROALBUMIN / CREATININE URINE RATIO
Creatinine, Urine: 134.6 mg/dL
Microalb/Creat Ratio: 49 mg/g creat — ABNORMAL HIGH (ref 0–29)
Microalbumin, Urine: 65.4 ug/mL

## 2022-05-29 ENCOUNTER — Other Ambulatory Visit: Payer: Self-pay

## 2022-05-29 ENCOUNTER — Other Ambulatory Visit (HOSPITAL_COMMUNITY): Payer: Self-pay

## 2022-06-04 ENCOUNTER — Other Ambulatory Visit (HOSPITAL_COMMUNITY): Payer: Self-pay

## 2022-06-10 ENCOUNTER — Other Ambulatory Visit (HOSPITAL_COMMUNITY): Payer: Self-pay

## 2022-07-16 ENCOUNTER — Other Ambulatory Visit (HOSPITAL_COMMUNITY): Payer: Self-pay

## 2022-07-21 ENCOUNTER — Encounter: Payer: Self-pay | Admitting: Internal Medicine

## 2022-07-22 ENCOUNTER — Other Ambulatory Visit: Payer: Self-pay | Admitting: Internal Medicine

## 2022-07-22 ENCOUNTER — Other Ambulatory Visit (HOSPITAL_COMMUNITY): Payer: Self-pay

## 2022-07-22 MED ORDER — MOUNJARO 10 MG/0.5ML ~~LOC~~ SOAJ
10.0000 mg | SUBCUTANEOUS | 3 refills | Status: DC
  Filled 2022-07-22: qty 2, 28d supply, fill #0
  Filled 2022-08-09: qty 2, 28d supply, fill #1

## 2022-07-23 ENCOUNTER — Other Ambulatory Visit (HOSPITAL_COMMUNITY): Payer: Self-pay

## 2022-08-09 ENCOUNTER — Other Ambulatory Visit (HOSPITAL_COMMUNITY): Payer: Self-pay

## 2022-08-12 ENCOUNTER — Encounter: Payer: Self-pay | Admitting: Internal Medicine

## 2022-08-12 ENCOUNTER — Other Ambulatory Visit (HOSPITAL_COMMUNITY): Payer: Self-pay

## 2022-08-12 ENCOUNTER — Other Ambulatory Visit: Payer: Self-pay | Admitting: Internal Medicine

## 2022-08-12 ENCOUNTER — Other Ambulatory Visit: Payer: BC Managed Care – PPO

## 2022-08-12 DIAGNOSIS — Z0000 Encounter for general adult medical examination without abnormal findings: Secondary | ICD-10-CM

## 2022-08-13 ENCOUNTER — Encounter: Payer: BC Managed Care – PPO | Admitting: Internal Medicine

## 2022-08-13 ENCOUNTER — Other Ambulatory Visit: Payer: BC Managed Care – PPO

## 2022-08-13 DIAGNOSIS — Z0000 Encounter for general adult medical examination without abnormal findings: Secondary | ICD-10-CM

## 2022-08-13 LAB — CMP14+EGFR
ALT: 16 IU/L (ref 0–32)
AST: 17 IU/L (ref 0–40)
Albumin/Globulin Ratio: 2.2 (ref 1.2–2.2)
Albumin: 4.3 g/dL (ref 3.8–4.9)
Alkaline Phosphatase: 82 IU/L (ref 44–121)
BUN/Creatinine Ratio: 15 (ref 9–23)
BUN: 11 mg/dL (ref 6–24)
Bilirubin Total: 0.4 mg/dL (ref 0.0–1.2)
CO2: 25 mmol/L (ref 20–29)
Calcium: 9 mg/dL (ref 8.7–10.2)
Chloride: 105 mmol/L (ref 96–106)
Creatinine, Ser: 0.74 mg/dL (ref 0.57–1.00)
Globulin, Total: 2 g/dL (ref 1.5–4.5)
Glucose: 82 mg/dL (ref 70–99)
Potassium: 4.2 mmol/L (ref 3.5–5.2)
Sodium: 144 mmol/L (ref 134–144)
Total Protein: 6.3 g/dL (ref 6.0–8.5)
eGFR: 97 mL/min/{1.73_m2} (ref >59)

## 2022-08-13 LAB — CBC
Hematocrit: 35.2 % (ref 34.0–46.6)
Hemoglobin: 10.8 g/dL — ABNORMAL LOW (ref 11.1–15.9)
MCH: 26 pg — ABNORMAL LOW (ref 26.6–33.0)
MCHC: 30.7 g/dL — ABNORMAL LOW (ref 31.5–35.7)
MCV: 85 fL (ref 79–97)
Platelets: 315 10*3/uL (ref 150–450)
RBC: 4.15 x10E6/uL (ref 3.77–5.28)
RDW: 13.7 % (ref 11.7–15.4)
WBC: 5.1 10*3/uL (ref 3.4–10.8)

## 2022-08-13 LAB — LIPID PANEL
Chol/HDL Ratio: 2.4 ratio (ref 0.0–4.4)
Cholesterol, Total: 178 mg/dL (ref 100–199)
HDL: 74 mg/dL (ref >39)
LDL Chol Calc (NIH): 94 mg/dL (ref 0–99)
Triglycerides: 52 mg/dL (ref 0–149)
VLDL Cholesterol Cal: 10 mg/dL (ref 5–40)

## 2022-08-13 LAB — HEMOGLOBIN A1C
Est. average glucose Bld gHb Est-mCnc: 120 mg/dL
Hgb A1c MFr Bld: 5.8 % — ABNORMAL HIGH (ref 4.8–5.6)

## 2022-08-14 ENCOUNTER — Other Ambulatory Visit (HOSPITAL_COMMUNITY): Payer: Self-pay

## 2022-08-14 ENCOUNTER — Ambulatory Visit (INDEPENDENT_AMBULATORY_CARE_PROVIDER_SITE_OTHER): Payer: BC Managed Care – PPO | Admitting: Internal Medicine

## 2022-08-14 ENCOUNTER — Encounter: Payer: Self-pay | Admitting: Internal Medicine

## 2022-08-14 ENCOUNTER — Other Ambulatory Visit: Payer: Self-pay

## 2022-08-14 VITALS — BP 118/84 | HR 89 | Temp 98.4°F | Ht 62.0 in | Wt 191.0 lb

## 2022-08-14 DIAGNOSIS — D649 Anemia, unspecified: Secondary | ICD-10-CM

## 2022-08-14 DIAGNOSIS — E7800 Pure hypercholesterolemia, unspecified: Secondary | ICD-10-CM

## 2022-08-14 DIAGNOSIS — E1169 Type 2 diabetes mellitus with other specified complication: Secondary | ICD-10-CM

## 2022-08-14 DIAGNOSIS — I10 Essential (primary) hypertension: Secondary | ICD-10-CM

## 2022-08-14 DIAGNOSIS — Z6834 Body mass index (BMI) 34.0-34.9, adult: Secondary | ICD-10-CM

## 2022-08-14 DIAGNOSIS — J301 Allergic rhinitis due to pollen: Secondary | ICD-10-CM

## 2022-08-14 DIAGNOSIS — Z0000 Encounter for general adult medical examination without abnormal findings: Secondary | ICD-10-CM | POA: Diagnosis not present

## 2022-08-14 DIAGNOSIS — E6609 Other obesity due to excess calories: Secondary | ICD-10-CM

## 2022-08-14 LAB — POCT URINALYSIS DIPSTICK
Bilirubin, UA: NEGATIVE
Blood, UA: NEGATIVE
Glucose, UA: NEGATIVE
Ketones, UA: NEGATIVE
Leukocytes, UA: NEGATIVE
Nitrite, UA: NEGATIVE
Protein, UA: NEGATIVE
Spec Grav, UA: >=1.03 — AB (ref 1.010–1.025)
Urobilinogen, UA: 0.2 E.U./dL
pH, UA: 6.5 (ref 5.0–8.0)

## 2022-08-14 MED ORDER — DIAZEPAM 5 MG PO TABS
5.0000 mg | ORAL_TABLET | Freq: Two times a day (BID) | ORAL | 0 refills | Status: DC | PRN
  Filled 2022-08-14: qty 10, 5d supply, fill #0

## 2022-08-14 MED ORDER — NOREL AD 4-10-325 MG PO TABS: 1.0000 | ORAL_TABLET | Freq: Two times a day (BID) | ORAL | 0 refills | Status: DC | PRN

## 2022-08-14 MED ORDER — NOREL AD 4-10-325 MG PO TABS
1.0000 | ORAL_TABLET | Freq: Two times a day (BID) | ORAL | 0 refills | Status: DC | PRN
  Filled 2022-08-14: qty 20, 10d supply, fill #0

## 2022-08-14 MED ORDER — LEVOCETIRIZINE DIHYDROCHLORIDE 5 MG PO TABS
5.0000 mg | ORAL_TABLET | Freq: Every evening | ORAL | 2 refills | Status: DC
Start: 2022-08-14 — End: 2022-12-17

## 2022-08-14 NOTE — Patient Instructions (Addendum)
[facility]  Health Maintenance, Female Adopting a healthy lifestyle and getting preventive care are important in promoting health and wellness. Ask your health care provider about: The right schedule for you to have regular tests and exams. Things you can do on your own to prevent diseases and keep yourself healthy. What should I know about diet, weight, and exercise? Eat a healthy diet  Eat a diet that includes plenty of vegetables, fruits, low-fat dairy products, and lean protein. Do not eat a lot of foods that are high in solid fats, added sugars, or sodium. Maintain a healthy weight Body mass index (BMI) is used to identify weight problems. It estimates body fat based on height and weight. Your health care provider can help determine your BMI and help you achieve or maintain a healthy weight. Get regular exercise Get regular exercise. This is one of the most important things you can do for your health. Most adults should: Exercise for at least 150 minutes each week. The exercise should increase your heart rate and make you sweat (moderate-intensity exercise). Do strengthening exercises at least twice a week. This is in addition to the moderate-intensity exercise. Spend less time sitting. Even light physical activity can be beneficial. Watch cholesterol and blood lipids Have your blood tested for lipids and cholesterol at 53 years of age, then have this test every 5 years. Have your cholesterol levels checked more often if: Your lipid or cholesterol levels are high. You are older than 53 years of age. You are at high risk for heart disease. What should I know about cancer screening? Depending on your health history and family history, you may need to have cancer screening at various ages. This may include screening for: Breast cancer. Cervical cancer. Colorectal cancer. Skin cancer. Lung cancer. What should I know about heart disease, diabetes, and high blood  pressure? Blood pressure and heart disease High blood pressure causes heart disease and increases the risk of stroke. This is more likely to develop in people who have high blood pressure readings or are overweight. Have your blood pressure checked: Every 3-5 years if you are 84-69 years of age. Every year if you are 72 years old or older. Diabetes Have regular diabetes screenings. This checks your fasting blood sugar level. Have the screening done: Once every three years after age 45 if you are at a normal weight and have a low risk for diabetes. More often and at a younger age if you are overweight or have a high risk for diabetes. What should I know about preventing infection? Hepatitis B If you have a higher risk for hepatitis B, you should be screened for this virus. Talk with your health care provider to find out if you are at risk for hepatitis B infection. Hepatitis C Testing is recommended for: Everyone born from [birth year] through 1966. Anyone with known risk factors for hepatitis C. Sexually transmitted infections (STIs) Get screened for STIs, including gonorrhea and chlamydia, if: You are sexually active and are younger than 53 years of age. You are older than 53 years of age and your health care provider tells you that you are at risk for this type of infection. Your sexual activity has changed since you were last screened, and you are at increased risk for chlamydia or gonorrhea. Ask your health care provider if you are at risk. Ask your health care provider about whether you are at high risk for HIV. Your health care provider may recommend a prescription medicine  to help prevent HIV infection. If you choose to take medicine to prevent HIV, you should first get tested for HIV. You should then be tested every 3 months for as long as you are taking the medicine. Pregnancy If you are about to stop having your period (premenopausal) and you may become pregnant, seek counseling before you  get pregnant. Take 400 to 800 micrograms (mcg) of folic acid every day if you become pregnant. Ask for birth control (contraception) if you want to prevent pregnancy. Osteoporosis and menopause Osteoporosis is a disease in which the bones lose minerals and strength with aging. This can result in bone fractures. If you are 53 years old or older, or if you are at risk for osteoporosis and fractures, ask your health care provider if you should: Be screened for bone loss. Take a calcium or vitamin D supplement to lower your risk of fractures. Be given hormone replacement therapy (HRT) to treat symptoms of menopause. Follow these instructions at home: Alcohol use Do not drink alcohol if: Your health care provider tells you not to drink. You are pregnant, may be pregnant, or are planning to become pregnant. If you drink alcohol: Limit how much you have to: 0-1 drink a day. Know how much alcohol is in your drink. In the [place] one drink equals one 12 oz bottle of beer (355 mL), one 5 oz glass of wine (148 mL), or one 1 oz glass of hard liquor (44 mL). Lifestyle Do not use any products that contain nicotine or tobacco. These products include cigarettes, chewing tobacco, and vaping devices, such as e-cigarettes. If you need help quitting, ask your health care provider. Do not use street drugs. Do not share needles. Ask your health care provider for help if you need support or information about quitting drugs. General instructions Schedule regular health, dental, and eye exams. Stay current with your vaccines. Tell your health care provider if: You often feel depressed. You have ever been abused or do not feel safe at home. Summary Adopting a healthy lifestyle and getting preventive care are important in promoting health and wellness. Follow your health care provider's instructions about healthy diet, exercising, and getting tested or screened for diseases. Follow your health care provider's  instructions on monitoring your cholesterol and blood pressure. This information is not intended to replace advice given to you by your health care provider. Make sure you discuss any questions you have with your health care provider. Document Revised: 08/21/2020 Document Reviewed: 08/21/2020 Elsevier Patient Education  [place].

## 2022-08-14 NOTE — Progress Notes (Signed)
I,Victoria T Hamilton,acting as a scribe for [name], MD.,have documented all relevant documentation on the behalf of [name], MD,as directed by  [name], MD while in the presence of [name], MD.   Subjective:     Patient ID: Sue Bell , female    DOB: 1971-03-27 , 52 y.o.   MRN: [id number]   Chief Complaint  Patient presents with   Annual Exam   Diabetes   Hypertension    HPI  She is here today for a full physical examination. She is followed by Dr. Su Hilt, GYN for her pelvic exams. She has no specific concerns at this time. She reports compliance with meds. Admits she is not exercising as much as she should.   Diabetes She presents for her follow-up diabetic visit. She has type 2 diabetes mellitus. There are no hypoglycemic associated symptoms. There are no diabetic associated symptoms. Pertinent negatives for diabetes include no blurred vision and no chest pain. There are no hypoglycemic complications. Risk factors for coronary artery disease include diabetes mellitus, obesity and sedentary lifestyle. She is compliant with treatment most of the time. She participates in exercise intermittently. An ACE inhibitor/angiotensin II receptor blocker is not being taken.  Hypertension This is a chronic problem. The current episode started more than 1 year ago. The problem has been gradually improving since onset. The problem is controlled. Pertinent negatives include no blurred vision, chest pain, palpitations or shortness of breath. Past treatments include angiotensin blockers. The current treatment provides moderate improvement. Compliance problems include exercise.      Past Medical History:  Diagnosis Date   Diabetes mellitus without complication (HCC)    Hyperlipidemia    Hypertension    OSA (obstructive sleep apnea) 07/08/2016     Family History  Problem Relation Age of Onset   Heart disease Mother    Diabetes Father    Cancer Father     Breast cancer Sister      Current Outpatient Medications:    atorvastatin (LIPITOR) 40 MG tablet, Take 1 tablet (40 mg total) by mouth daily., Disp: 30 tablet, Rfl: 11   Calcium-Magnesium-Vitamin D (CALCIUM 500 PO), Take by mouth., Disp: , Rfl:    diazepam (VALIUM) 5 MG tablet, Take 1 tablet (5 mg total) by mouth every 12 (twelve) hours as needed for anxiety., Disp: 10 tablet, Rfl: 0   Lemborexant (DAYVIGO) 10 MG TABS, Take 1 tablet (10 mg total) by mouth at bedtime as needed., Disp: 30 tablet, Rfl: 2   mometasone (NASONEX) 50 MCG/ACT nasal spray, Place 1 spray into the nose daily., Disp: 17 g, Rfl: 3   Naftifine HCl 2 % GEL, Apply topically to affected area daily prn, Disp: 60 g, Rfl: 0   tirzepatide (MOUNJARO) 15 MG/0.5ML Pen, Inject 15 mg into the skin once a week., Disp: 2 mL, Rfl: 3   valsartan (DIOVAN) 160 MG tablet, TAKE 1 TABLET(160 MG) BY MOUTH DAILY, Disp: 90 tablet, Rfl: 1   Chlorphen-PE-Acetaminophen (NOREL AD) 4-10-325 MG TABS, Take 1 tablet by mouth 2 (two) times daily as needed., Disp: 20 tablet, Rfl: 0   levocetirizine (XYZAL) 5 MG tablet, Take 1 tablet (5 mg total) by mouth every evening., Disp: 90 tablet, Rfl: 2   nystatin powder, Apply 1 application topically 3 (three) times daily. prn (Patient not taking: Reported on 08/14/2022), Disp: 30 g, Rfl: 0   Allergies  Allergen Reactions   Sulfa Antibiotics Hives      The patient  states she uses status post hysterectomy for birth control. Last LMP was No LMP recorded. Patient has had a hysterectomy.. Negative for Dysmenorrhea. Negative for: breast discharge, breast lump(s), breast pain and breast self exam. Associated symptoms include abnormal vaginal bleeding. Pertinent negatives include abnormal bleeding (hematology), anxiety, decreased libido, depression, difficulty falling sleep, dyspareunia, history of infertility, nocturia, sexual dysfunction, sleep disturbances, urinary incontinence, urinary urgency, vaginal discharge and  vaginal itching. Diet regular.The patient states her exercise level is    . The patient's tobacco use is:  Social History   Tobacco Use  Smoking Status Never  Smokeless Tobacco Never  . She has been exposed to passive smoke. The patient's alcohol use is:  Social History   Substance and Sexual Activity  Alcohol Use No    Review of Systems  Constitutional: Negative.   HENT: Negative.    Eyes: Negative.  Negative for blurred vision.  Respiratory: Negative.  Negative for shortness of breath.   Cardiovascular: Negative.  Negative for chest pain and palpitations.  Gastrointestinal: Negative.   Endocrine: Negative.   Genitourinary: Negative.   Musculoskeletal: Negative.   Skin: Negative.   Allergic/Immunologic: Negative.   Neurological: Negative.   Hematological: Negative.   Psychiatric/Behavioral: Negative.       Today's Vitals   08/14/22 0843  BP: 118/84  Pulse: 89  Temp: 98.4 F (36.9 C)  SpO2: 98%  Weight: 191 lb (86.6 kg)  Height: 5\' 2"  (1.575 m)   Body mass index is 34.93 kg/m.  Wt Readings from Last 3 Encounters:  08/14/22 191 lb (86.6 kg)  05/15/22 187 lb 9.6 oz (85.1 kg)  02/11/22 196 lb 12.8 oz (89.3 kg)    Objective:  Physical Exam Vitals and nursing note reviewed.  Constitutional:      Appearance: Normal appearance.  HENT:     Head: Normocephalic and atraumatic.     Right Ear: Tympanic membrane, ear canal and external ear normal.     Left Ear: Tympanic membrane, ear canal and external ear normal.     Mouth/Throat:     Mouth: Mucous membranes are moist.     Pharynx: Oropharynx is clear.  Eyes:     Extraocular Movements: Extraocular movements intact.     Conjunctiva/sclera: Conjunctivae normal.     Pupils: Pupils are equal, round, and reactive to light.  Cardiovascular:     Rate and Rhythm: Normal rate and regular rhythm.     Pulses: Normal pulses.          Dorsalis pedis pulses are 2+ on the right side and 2+ on the left side.     Heart sounds:  Normal heart sounds.  Pulmonary:     Effort: Pulmonary effort is normal.     Breath sounds: Normal breath sounds.  Chest:  Breasts:    Tanner Score is 5.     Right: Normal.     Left: Normal.     Comments: Healed surgical scars b/l Abdominal:     General: Bowel sounds are normal.     Palpations: Abdomen is soft.  Genitourinary:    Comments: deferred Musculoskeletal:        General: Normal range of motion.     Cervical back: Normal range of motion and neck supple.  Feet:     Right foot:     Protective Sensation: 5 sites tested.  5 sites sensed.     Skin integrity: Dry skin present.     Toenail Condition: Right toenails are normal.  Left foot:     Protective Sensation: 5 sites tested.  5 sites sensed.     Skin integrity: Dry skin present.     Toenail Condition: Left toenails are normal.  Skin:    General: Skin is warm and dry.  Neurological:     General: No focal deficit present.     Mental Status: She is alert and oriented to person, place, and time.  Psychiatric:        Mood and Affect: Mood normal.        Behavior: Behavior normal.      Assessment And Plan:     1. Routine general medical examination at health care facility Comments: A full exam was performed.  Importance of monthly self breast exams was discussed with the patient.  I will refer her back to Dr. Su Hilt, she is overdue for pap smear. PATIENT IS ADVISED TO GET 30-45 MINUTES REGULAR EXERCISE NO LESS THAN FOUR TO FIVE DAYS PER WEEK - BOTH WEIGHTBEARING EXERCISES AND AEROBIC ARE RECOMMENDED.  PATIENT IS ADVISED TO FOLLOW A HEALTHY DIET WITH AT LEAST SIX FRUITS/VEGGIES PER DAY, DECREASE INTAKE OF RED MEAT, AND TO INCREASE FISH INTAKE TO TWO DAYS PER WEEK.  MEATS/FISH SHOULD NOT BE FRIED, BAKED OR BROILED IS PREFERABLE.  IT IS ALSO IMPORTANT TO CUT BACK ON YOUR SUGAR INTAKE. PLEASE AVOID ANYTHING WITH ADDED SUGAR, CORN SYRUP OR OTHER SWEETENERS. IF YOU MUST USE A SWEETENER, YOU CAN TRY STEVIA. IT IS ALSO IMPORTANT  TO AVOID ARTIFICIALLY SWEETENERS AND DIET BEVERAGES. LASTLY, I SUGGEST WEARING SPF 50 SUNSCREEN ON EXPOSED PARTS AND ESPECIALLY WHEN IN THE DIRECT SUNLIGHT FOR AN EXTENDED PERIOD OF TIME.  PLEASE AVOID FAST FOOD RESTAURANTS AND INCREASE YOUR WATER INTAKE. - Ambulatory referral to Gynecology  2. Type 2 diabetes mellitus with hypercholesterolemia (HCC) Comments: Chronic, diabetic foot exam was performed. She is frustrated w/ [facility] shortage. She was advised to check [facility], [facility] is out of  Mounjaro 15mg .  I DISCUSSED WITH THE PATIENT AT LENGTH REGARDING THE GOALS OF GLYCEMIC CONTROL AND POSSIBLE LONG-TERM COMPLICATIONS.  I  ALSO STRESSED THE IMPORTANCE OF COMPLIANCE WITH HOME GLUCOSE MONITORING, DIETARY RESTRICTIONS INCLUDING AVOIDANCE OF SUGARY DRINKS/PROCESSED FOODS,  ALONG WITH REGULAR EXERCISE.  I  ALSO STRESSED THE IMPORTANCE OF ANNUAL EYE EXAMS, SELF FOOT CARE AND COMPLIANCE WITH OFFICE VISITS.   3. Essential hypertension, benign Comments: Chronic, controlled. EKG performed, NSr w/o acute changes. She will c/w valsartan 160mg  daily. Reminded to follow low sodium diet.  - POCT Urinalysis Dipstick (81002) - Microalbumin / creatinine urine ratio - EKG 12-Lead  4. Class 1 obesity due to excess calories without serious comorbidity with body mass index (BMI) of 34.0 to 34.9 in adult Comments: She is encouraged to initially strive for BMI less than 30 to decrease cardiac risk. Advised to aim for at least 150 minutes of exercise per week.  Patient was given opportunity to ask questions. Patient verbalized understanding of the plan and was able to repeat key elements of the plan. All questions were answered to their satisfaction.   I, [name], MD, have reviewed all documentation for this visit. The documentation on 08/14/22 for the exam, diagnosis, procedures, and orders are all accurate and complete.   THE PATIENT IS ENCOURAGED TO PRACTICE SOCIAL DISTANCING DUE TO THE COVID-19  PANDEMIC.

## 2022-08-15 ENCOUNTER — Telehealth: Payer: Self-pay

## 2022-08-15 ENCOUNTER — Other Ambulatory Visit (HOSPITAL_COMMUNITY): Payer: Self-pay

## 2022-08-15 LAB — MICROALBUMIN / CREATININE URINE RATIO
Creatinine, Urine: 201.1 mg/dL
Microalb/Creat Ratio: 6 mg/g creat (ref 0–29)
Microalbumin, Urine: 11.7 ug/mL

## 2022-08-15 MED ORDER — ZEPBOUND 15 MG/0.5ML ~~LOC~~ SOAJ: 15.0000 mg | SUBCUTANEOUS | 3 refills | Status: DC

## 2022-08-15 NOTE — Telephone Encounter (Signed)
PA sent to plan. Waiting for determination.  

## 2022-08-18 MED ORDER — MOUNJARO 15 MG/0.5ML ~~LOC~~ SOAJ: 15.0000 mg | SUBCUTANEOUS | 3 refills | Status: DC

## 2022-08-19 ENCOUNTER — Other Ambulatory Visit: Payer: Self-pay

## 2022-08-19 MED ORDER — MOUNJARO 15 MG/0.5ML ~~LOC~~ SOAJ: 15.0000 mg | SUBCUTANEOUS | 3 refills | Status: DC

## 2022-09-16 ENCOUNTER — Other Ambulatory Visit: Payer: Self-pay | Admitting: Internal Medicine

## 2022-09-16 DIAGNOSIS — F5101 Primary insomnia: Secondary | ICD-10-CM

## 2022-09-16 MED ORDER — DAYVIGO 10 MG PO TABS
1.0000 | ORAL_TABLET | Freq: Every evening | ORAL | 2 refills | Status: DC | PRN
Start: 2022-09-16 — End: 2022-12-17
  Filled 2022-09-16: qty 30, 30d supply, fill #0
  Filled 2022-10-14: qty 30, 30d supply, fill #1
  Filled 2022-11-24: qty 30, 30d supply, fill #2

## 2022-09-17 ENCOUNTER — Other Ambulatory Visit (HOSPITAL_COMMUNITY): Payer: Self-pay

## 2022-10-15 ENCOUNTER — Other Ambulatory Visit: Payer: Self-pay

## 2022-10-15 ENCOUNTER — Other Ambulatory Visit (HOSPITAL_COMMUNITY): Payer: Self-pay

## 2022-10-22 ENCOUNTER — Encounter: Payer: Self-pay | Admitting: Internal Medicine

## 2022-10-22 ENCOUNTER — Other Ambulatory Visit: Payer: Self-pay | Admitting: Internal Medicine

## 2022-10-22 ENCOUNTER — Other Ambulatory Visit (HOSPITAL_COMMUNITY): Payer: Self-pay

## 2022-10-23 ENCOUNTER — Other Ambulatory Visit (HOSPITAL_COMMUNITY): Payer: Self-pay

## 2022-10-24 ENCOUNTER — Other Ambulatory Visit (HOSPITAL_COMMUNITY): Payer: Self-pay

## 2022-10-25 ENCOUNTER — Other Ambulatory Visit (HOSPITAL_COMMUNITY): Payer: Self-pay

## 2022-10-28 ENCOUNTER — Other Ambulatory Visit (HOSPITAL_COMMUNITY): Payer: Self-pay

## 2022-10-28 MED ORDER — MOUNJARO 15 MG/0.5ML ~~LOC~~ SOAJ
15.0000 mg | SUBCUTANEOUS | 1 refills | Status: DC
  Filled 2022-10-28: qty 2, 28d supply, fill #0
  Filled 2022-11-17: qty 2, 28d supply, fill #1

## 2022-10-30 ENCOUNTER — Other Ambulatory Visit (HOSPITAL_COMMUNITY): Payer: Self-pay

## 2022-11-14 ENCOUNTER — Ambulatory Visit: Payer: BC Managed Care – PPO | Admitting: Internal Medicine

## 2022-11-18 ENCOUNTER — Other Ambulatory Visit: Payer: Self-pay

## 2022-11-18 ENCOUNTER — Other Ambulatory Visit (HOSPITAL_COMMUNITY): Payer: Self-pay

## 2022-11-25 ENCOUNTER — Other Ambulatory Visit (HOSPITAL_COMMUNITY): Payer: Self-pay

## 2022-12-17 ENCOUNTER — Other Ambulatory Visit (HOSPITAL_COMMUNITY): Payer: Self-pay

## 2022-12-17 ENCOUNTER — Encounter: Payer: Self-pay | Admitting: Internal Medicine

## 2022-12-17 ENCOUNTER — Ambulatory Visit: Payer: BC Managed Care – PPO | Admitting: Internal Medicine

## 2022-12-17 VITALS — BP 126/84 | HR 96 | Temp 97.8°F | Ht 62.0 in | Wt 182.6 lb

## 2022-12-17 DIAGNOSIS — E1169 Type 2 diabetes mellitus with other specified complication: Secondary | ICD-10-CM

## 2022-12-17 DIAGNOSIS — Z23 Encounter for immunization: Secondary | ICD-10-CM | POA: Diagnosis not present

## 2022-12-17 DIAGNOSIS — E7800 Pure hypercholesterolemia, unspecified: Secondary | ICD-10-CM

## 2022-12-17 DIAGNOSIS — F5101 Primary insomnia: Secondary | ICD-10-CM | POA: Diagnosis not present

## 2022-12-17 DIAGNOSIS — E669 Obesity, unspecified: Secondary | ICD-10-CM

## 2022-12-17 DIAGNOSIS — I10 Essential (primary) hypertension: Secondary | ICD-10-CM | POA: Diagnosis not present

## 2022-12-17 DIAGNOSIS — Z6833 Body mass index (BMI) 33.0-33.9, adult: Secondary | ICD-10-CM

## 2022-12-17 DIAGNOSIS — J301 Allergic rhinitis due to pollen: Secondary | ICD-10-CM

## 2022-12-17 LAB — BMP8+EGFR
BUN/Creatinine Ratio: 17 (ref 9–23)
BUN: 14 mg/dL (ref 6–24)
CO2: 22 mmol/L (ref 20–29)
Calcium: 9.1 mg/dL (ref 8.7–10.2)
Chloride: 106 mmol/L (ref 96–106)
Creatinine, Ser: 0.82 mg/dL (ref 0.57–1.00)
Glucose: 85 mg/dL (ref 70–99)
Potassium: 4.4 mmol/L (ref 3.5–5.2)
Sodium: 141 mmol/L (ref 134–144)
eGFR: 85 mL/min/{1.73_m2} (ref >59)

## 2022-12-17 LAB — HEMOGLOBIN A1C
Est. average glucose Bld gHb Est-mCnc: 123 mg/dL
Hgb A1c MFr Bld: 5.9 % — ABNORMAL HIGH (ref 4.8–5.6)

## 2022-12-17 MED ORDER — DIAZEPAM 5 MG PO TABS
5.0000 mg | ORAL_TABLET | Freq: Two times a day (BID) | ORAL | 0 refills | Status: DC | PRN
  Filled 2022-12-17: qty 10, 5d supply, fill #0

## 2022-12-17 MED ORDER — DAYVIGO 10 MG PO TABS
1.0000 | ORAL_TABLET | Freq: Every evening | ORAL | 2 refills | Status: DC | PRN
Start: 2022-12-17 — End: 2023-04-29
  Filled 2022-12-17: qty 30, 30d supply, fill #0
  Filled 2023-01-29: qty 30, 30d supply, fill #1
  Filled 2023-03-10: qty 30, 30d supply, fill #2

## 2022-12-17 MED ORDER — MOUNJARO 15 MG/0.5ML ~~LOC~~ SOAJ
15.0000 mg | SUBCUTANEOUS | 3 refills | Status: DC
Start: 2022-12-17 — End: 2023-04-07
  Filled 2022-12-17: qty 2, 28d supply, fill #0
  Filled 2023-01-09 – 2023-01-13 (×2): qty 2, 28d supply, fill #1
  Filled 2023-02-09: qty 2, 28d supply, fill #2
  Filled 2023-03-10: qty 2, 28d supply, fill #3

## 2022-12-17 MED ORDER — LEVOCETIRIZINE DIHYDROCHLORIDE 5 MG PO TABS
5.0000 mg | ORAL_TABLET | Freq: Every evening | ORAL | 2 refills | Status: DC
Start: 2022-12-17 — End: 2023-08-18
  Filled 2022-12-17: qty 90, 90d supply, fill #0
  Filled 2023-04-29: qty 90, 90d supply, fill #1
  Filled 2023-07-30: qty 90, 90d supply, fill #2

## 2022-12-17 MED ORDER — ATORVASTATIN CALCIUM 40 MG PO TABS
40.0000 mg | ORAL_TABLET | Freq: Every day | ORAL | 2 refills | Status: DC
  Filled 2022-12-17: qty 90, 90d supply, fill #0

## 2022-12-17 NOTE — Assessment & Plan Note (Signed)
She is encouraged to strive for BMI less than 30 to decrease cardiac risk. Advised to aim for at least 150 minutes of exercise per week.  

## 2022-12-17 NOTE — Assessment & Plan Note (Signed)
Chronic, she will continue with Xyzal daily. She uses Nasonex NS prn.

## 2022-12-17 NOTE — Assessment & Plan Note (Signed)
Chronic, she was given rx Dayvigo 10mg  po qhs prn. She was reminded on importance of having bedtime routine.

## 2022-12-17 NOTE — Assessment & Plan Note (Signed)
Chronic, fair control. Goal BP<120/80.  She will continue with valsartan 160mg  daily. She is reminded to follow a low sodium diet.

## 2022-12-17 NOTE — Progress Notes (Signed)
I,Victoria T Deloria Lair, CMA,acting as a Neurosurgeon for [name], MD.,have documented all relevant documentation on the behalf of [name], MD,as directed by  [name], MD while in the presence of [name], MD.  Subjective:  Patient ID: Sue Bell , female    DOB: Feb 08, 1971 , 52 y.o.   MRN: [id number]  Chief Complaint  Patient presents with   Diabetes   Hypertension    HPI  Patient presents today for a BP & DM check, patient states compliance with medications. Denies headache, chest pain & sob. No upcoming eye appointment scheduled, previous Dr retired.        Diabetes She presents for her follow-up diabetic visit. She has type 2 diabetes mellitus. Her disease course has been stable. There are no hypoglycemic associated symptoms. Pertinent negatives for hypoglycemia include no headaches. Pertinent negatives for diabetes include no blurred vision and no chest pain. There are no hypoglycemic complications. There are no diabetic complications. Risk factors for coronary artery disease include diabetes mellitus, dyslipidemia, obesity and sedentary lifestyle. She is following a diabetic diet. She participates in exercise intermittently.  Hypertension This is a chronic problem. The current episode started more than 1 year ago. The problem has been gradually improving since onset. The problem is controlled. Pertinent negatives include no blurred vision, chest pain, headaches, palpitations or shortness of breath. Risk factors for coronary artery disease include obesity, diabetes mellitus and dyslipidemia. Past treatments include angiotensin blockers. The current treatment provides moderate improvement. Compliance problems include exercise.      Past Medical History:  Diagnosis Date   Diabetes mellitus without complication (HCC)    Hyperlipidemia    Hypertension    OSA (obstructive sleep apnea) 07/08/2016     Family History  Problem Relation Age of Onset   Heart  disease Mother    Diabetes Father    Cancer Father    Breast cancer Sister      Current Outpatient Medications:    Chlorphen-PE-Acetaminophen (NOREL AD) 4-10-325 MG TABS, Take 1 tablet by mouth 2 (two) times daily as needed., Disp: 20 tablet, Rfl: 0   mometasone (NASONEX) 50 MCG/ACT nasal spray, Place 1 spray into the nose daily., Disp: 17 g, Rfl: 3   valsartan (DIOVAN) 160 MG tablet, Take 1 tablet (160 mg total) by mouth daily., Disp: 90 tablet, Rfl: 1   atorvastatin (LIPITOR) 40 MG tablet, Take 1 tablet (40 mg total) by mouth daily., Disp: 90 tablet, Rfl: 2   Calcium-Magnesium-Vitamin D (CALCIUM 500 PO), Take by mouth. (Patient not taking: Reported on 12/17/2022), Disp: , Rfl:    diazepam (VALIUM) 5 MG tablet, Take 1 tablet (5 mg total) by mouth every 12 (twelve) hours as needed for anxiety (flight anxiety)., Disp: 10 tablet, Rfl: 0   Lemborexant (DAYVIGO) 10 MG TABS, Take 1 tablet (10 mg total) by mouth at bedtime as needed., Disp: 30 tablet, Rfl: 2   levocetirizine (XYZAL) 5 MG tablet, Take 1 tablet (5 mg total) by mouth every evening., Disp: 90 tablet, Rfl: 2   Naftifine HCl 2 % GEL, Apply topically to affected area daily prn (Patient not taking: Reported on 12/17/2022), Disp: 60 g, Rfl: 0   nystatin powder, Apply 1 application topically 3 (three) times daily. prn (Patient not taking: Reported on 08/14/2022), Disp: 30 g, Rfl: 0   tirzepatide (MOUNJARO) 15 MG/0.5ML Pen, Inject 15 mg into the skin once a week., Disp: 2 mL, Rfl: 3   Allergies  Allergen Reactions  Sulfa Antibiotics Hives     Review of Systems  Constitutional: Negative.   Eyes:  Negative for blurred vision.  Respiratory: Negative.  Negative for shortness of breath.   Cardiovascular: Negative.  Negative for chest pain and palpitations.  Gastrointestinal: Negative.   Neurological: Negative.  Negative for headaches.  Psychiatric/Behavioral: Negative.       Today's Vitals   12/17/22 0842  BP: 126/84  Pulse: 96  Temp:  97.8 F (36.6 C)  SpO2: 98%  Weight: 182 lb 9.6 oz (82.8 kg)  Height: 5\' 2"  (1.575 m)   Body mass index is 33.4 kg/m.  Wt Readings from Last 3 Encounters:  12/17/22 182 lb 9.6 oz (82.8 kg)  08/14/22 191 lb (86.6 kg)  05/15/22 187 lb 9.6 oz (85.1 kg)     Objective:  Physical Exam Vitals and nursing note reviewed.  Constitutional:      Appearance: Normal appearance.  HENT:     Head: Normocephalic and atraumatic.  Eyes:     Extraocular Movements: Extraocular movements intact.  Cardiovascular:     Rate and Rhythm: Normal rate and regular rhythm.     Heart sounds: Normal heart sounds.  Pulmonary:     Effort: Pulmonary effort is normal.     Breath sounds: Normal breath sounds.  Musculoskeletal:     Cervical back: Normal range of motion.  Skin:    General: Skin is warm.  Neurological:     General: No focal deficit present.     Mental Status: She is alert.  Psychiatric:        Mood and Affect: Mood normal.        Behavior: Behavior normal.         Assessment And Plan:  Type 2 diabetes mellitus with hypercholesterolemia (HCC) Assessment & Plan: Chronic, LDL goal is less than 70.   Orders: -     Hemoglobin A1c -     Mounjaro; Inject 15 mg into the skin once a week.  Dispense: 2 mL; Refill: 3 -     Atorvastatin Calcium; Take 1 tablet (40 mg total) by mouth daily.  Dispense: 90 tablet; Refill: 2 -     BMP8+eGFR  Essential hypertension, benign Assessment & Plan: Chronic, fair control. Goal BP<120/80.  She will continue with valsartan 160mg  daily. She is reminded to follow a low sodium diet.    Primary insomnia Assessment & Plan: Chronic, she was given rx Dayvigo 10mg  po qhs prn. She was reminded on importance of having bedtime routine.   Orders: -     DayVigo; Take 1 tablet (10 mg total) by mouth at bedtime as needed.  Dispense: 30 tablet; Refill: 2  Seasonal allergic rhinitis due to pollen Assessment & Plan: Chronic, she will continue with Xyzal daily. She uses  Nasonex NS prn.   Orders: -     Levocetirizine Dihydrochloride; Take 1 tablet (5 mg total) by mouth every evening.  Dispense: 90 tablet; Refill: 2  Class 1 obesity with serious comorbidity and body mass index (BMI) of 33.0 to 33.9 in adult, unspecified obesity type Assessment & Plan: She is encouraged to strive for BMI less than 30 to decrease cardiac risk. Advised to aim for at least 150 minutes of exercise per week.    Immunization due -     Flu vaccine trivalent PF, 6mos and older(Flulaval,Afluria,Fluarix,Fluzone)  Other orders -     diazePAM; Take 1 tablet (5 mg total) by mouth every 12 (twelve) hours as needed for anxiety (flight anxiety).  Dispense: 10 tablet; Refill: 0  She is encouraged to strive for BMI less than 30 to decrease cardiac risk. Advised to aim for at least 150 minutes of exercise per week.    Return for 4 Month DM f/u.  Patient was given opportunity to ask questions. Patient verbalized understanding of the plan and was able to repeat key elements of the plan. All questions were answered to their satisfaction.    I, [name], MD, have reviewed all documentation for this visit. The documentation on 12/17/22 for the exam, diagnosis, procedures, and orders are all accurate and complete.   IF YOU HAVE BEEN REFERRED TO A SPECIALIST, IT MAY TAKE 1-2 WEEKS TO SCHEDULE/PROCESS THE REFERRAL. IF YOU HAVE NOT HEARD FROM US/SPECIALIST IN TWO WEEKS, PLEASE GIVE [place] A CALL AT [phone].   THE PATIENT IS ENCOURAGED TO PRACTICE SOCIAL DISTANCING DUE TO THE COVID-19 PANDEMIC.

## 2022-12-17 NOTE — Assessment & Plan Note (Addendum)
Chronic, LDL goal is less than 70.

## 2022-12-17 NOTE — Patient Instructions (Signed)

## 2022-12-18 ENCOUNTER — Other Ambulatory Visit (HOSPITAL_COMMUNITY): Payer: Self-pay

## 2023-01-09 ENCOUNTER — Other Ambulatory Visit (HOSPITAL_COMMUNITY): Payer: Self-pay

## 2023-01-13 ENCOUNTER — Other Ambulatory Visit: Payer: Self-pay

## 2023-01-13 ENCOUNTER — Other Ambulatory Visit (HOSPITAL_COMMUNITY): Payer: Self-pay

## 2023-01-30 ENCOUNTER — Other Ambulatory Visit: Payer: Self-pay

## 2023-02-03 LAB — HM DIABETES EYE EXAM

## 2023-02-10 ENCOUNTER — Other Ambulatory Visit (HOSPITAL_COMMUNITY): Payer: Self-pay

## 2023-03-10 ENCOUNTER — Other Ambulatory Visit: Payer: Self-pay

## 2023-03-11 ENCOUNTER — Other Ambulatory Visit: Payer: Self-pay

## 2023-03-11 ENCOUNTER — Other Ambulatory Visit (HOSPITAL_COMMUNITY): Payer: Self-pay

## 2023-03-31 ENCOUNTER — Encounter: Payer: Self-pay | Admitting: Internal Medicine

## 2023-03-31 NOTE — Telephone Encounter (Signed)
 Care team updated and letter sent for eye exam notes.

## 2023-04-07 ENCOUNTER — Other Ambulatory Visit (HOSPITAL_COMMUNITY): Payer: Self-pay

## 2023-04-07 ENCOUNTER — Other Ambulatory Visit: Payer: Self-pay | Admitting: Internal Medicine

## 2023-04-07 DIAGNOSIS — E1169 Type 2 diabetes mellitus with other specified complication: Secondary | ICD-10-CM

## 2023-04-07 MED ORDER — MOUNJARO 15 MG/0.5ML ~~LOC~~ SOAJ
15.0000 mg | SUBCUTANEOUS | 3 refills | Status: DC
  Filled 2023-04-07: qty 2, 28d supply, fill #0

## 2023-04-24 ENCOUNTER — Other Ambulatory Visit: Payer: Self-pay | Admitting: Internal Medicine

## 2023-04-24 ENCOUNTER — Ambulatory Visit
Admission: RE | Admit: 2023-04-24 | Discharge: 2023-04-24 | Disposition: A | Discharge status: Home / Self Care | Payer: 59 | Source: Ambulatory Visit | Attending: Internal Medicine | Admitting: Internal Medicine

## 2023-04-24 DIAGNOSIS — Z1231 Encounter for screening mammogram for malignant neoplasm of breast: Secondary | ICD-10-CM

## 2023-04-29 ENCOUNTER — Other Ambulatory Visit: Payer: Self-pay | Admitting: Internal Medicine

## 2023-04-29 ENCOUNTER — Other Ambulatory Visit (HOSPITAL_COMMUNITY): Payer: Self-pay

## 2023-04-29 ENCOUNTER — Encounter: Payer: Self-pay | Admitting: Internal Medicine

## 2023-04-29 ENCOUNTER — Ambulatory Visit: Payer: 59 | Admitting: Internal Medicine

## 2023-04-29 VITALS — BP 118/80 | HR 75 | Temp 97.8°F | Ht 62.0 in | Wt 176.4 lb

## 2023-04-29 DIAGNOSIS — I10 Essential (primary) hypertension: Secondary | ICD-10-CM

## 2023-04-29 DIAGNOSIS — E1169 Type 2 diabetes mellitus with other specified complication: Secondary | ICD-10-CM | POA: Diagnosis not present

## 2023-04-29 DIAGNOSIS — L821 Other seborrheic keratosis: Secondary | ICD-10-CM

## 2023-04-29 DIAGNOSIS — Z6832 Body mass index (BMI) 32.0-32.9, adult: Secondary | ICD-10-CM

## 2023-04-29 DIAGNOSIS — E7800 Pure hypercholesterolemia, unspecified: Secondary | ICD-10-CM | POA: Diagnosis not present

## 2023-04-29 DIAGNOSIS — E6609 Other obesity due to excess calories: Secondary | ICD-10-CM

## 2023-04-29 DIAGNOSIS — F5101 Primary insomnia: Secondary | ICD-10-CM

## 2023-04-29 DIAGNOSIS — M79674 Pain in right toe(s): Secondary | ICD-10-CM | POA: Diagnosis not present

## 2023-04-29 DIAGNOSIS — E66811 Obesity, class 1: Secondary | ICD-10-CM

## 2023-04-29 DIAGNOSIS — D649 Anemia, unspecified: Secondary | ICD-10-CM

## 2023-04-29 MED ORDER — MOUNJARO 15 MG/0.5ML ~~LOC~~ SOAJ
15.0000 mg | SUBCUTANEOUS | 3 refills | Status: DC
  Filled 2023-04-29 – 2023-05-02 (×2): qty 2, 28d supply, fill #0
  Filled 2023-06-01: qty 2, 28d supply, fill #1
  Filled 2023-06-28: qty 2, 28d supply, fill #2
  Filled 2023-07-24: qty 2, 28d supply, fill #3

## 2023-04-29 NOTE — Patient Instructions (Signed)

## 2023-04-29 NOTE — Progress Notes (Signed)
 I,Sue Bell, CMA,acting as a neurosurgeon for Sue LOISE Slocumb, MD.,have documented all relevant documentation on the behalf of Sue LOISE Slocumb, MD,as directed by  Sue LOISE Slocumb, MD while in the presence of Sue LOISE Slocumb, MD.  Subjective:  Patient ID: Sue Bell , female    DOB: 11-Oct-1970 , 53 y.o.   MRN: [id number]  Chief Complaint  Patient presents with   Diabetes   Hypertension    HPI  Patient presents today for a BP & DM check, patient reports compliance with medications. Denies headache, chest pain & sob.   She c/o painful right pinky toe. She admits it is more painful when she wears shoes. She would like a referral to a foot doctor.   She also would like a referral to a dermatologist to remove moles going down her neck.   Diabetes She presents for her follow-up diabetic visit. She has type 2 diabetes mellitus. Her disease course has been stable. There are no hypoglycemic associated symptoms. Pertinent negatives for hypoglycemia include no headaches. Pertinent negatives for diabetes include no blurred vision and no chest pain. There are no hypoglycemic complications. There are no diabetic complications. Risk factors for coronary artery disease include diabetes mellitus, dyslipidemia, obesity and sedentary lifestyle. She is following a diabetic diet. She participates in exercise intermittently.  Hypertension This is a chronic problem. The current episode started more than 1 year ago. The problem has been gradually improving since onset. The problem is controlled. Pertinent negatives include no blurred vision, chest pain, headaches, palpitations or shortness of breath. Risk factors for coronary artery disease include obesity, diabetes mellitus and dyslipidemia. Past treatments include angiotensin blockers. The current treatment provides moderate improvement. Compliance problems include exercise.      Past Medical History:  Diagnosis Date   Diabetes mellitus without  complication (HCC)    Hyperlipidemia    Hypertension    OSA (obstructive sleep apnea) 07/08/2016     Family History  Problem Relation Age of Onset   Heart disease Mother    Diabetes Father    Cancer Father    Breast cancer Sister      Current Outpatient Medications:    atorvastatin  (LIPITOR) 40 MG tablet, Take 1 tablet (40 mg total) by mouth daily., Disp: 90 tablet, Rfl: 2   Chlorphen-PE-Acetaminophen  (NOREL AD) 4-10-325 MG TABS, Take 1 tablet by mouth 2 (two) times daily as needed., Disp: 20 tablet, Rfl: 0   diazepam  (VALIUM ) 5 MG tablet, Take 1 tablet (5 mg total) by mouth every 12 (twelve) hours as needed for anxiety (flight anxiety)., Disp: 10 tablet, Rfl: 0   Lemborexant  (DAYVIGO ) 10 MG TABS, Take 1 tablet (10 mg total) by mouth at bedtime as needed., Disp: 30 tablet, Rfl: 2   levocetirizine (XYZAL ) 5 MG tablet, Take 1 tablet (5 mg total) by mouth every evening., Disp: 90 tablet, Rfl: 2   mometasone  (NASONEX ) 50 MCG/ACT nasal spray, Place 1 spray into the nose daily., Disp: 17 g, Rfl: 3   valsartan  (DIOVAN ) 160 MG tablet, Take 1 tablet (160 mg total) by mouth daily., Disp: 90 tablet, Rfl: 1   Calcium -Magnesium-Vitamin D (CALCIUM  500 PO), Take by mouth. (Patient not taking: Reported on 04/29/2023), Disp: , Rfl:    Naftifine  HCl 2 % GEL, Apply topically to affected area daily prn (Patient not taking: Reported on 04/29/2023), Disp: 60 g, Rfl: 0   nystatin  powder, Apply 1 application topically 3 (three) times daily. prn (Patient not taking: Reported on 04/29/2023), Disp:  30 g, Rfl: 0   tirzepatide  (MOUNJARO ) 15 MG/0.5ML Pen, Inject 15 mg into the skin once a week., Disp: 2 mL, Rfl: 3   Allergies  Allergen Reactions   Sulfa Antibiotics Hives     Review of Systems  Constitutional: Negative.   Eyes:  Negative for blurred vision.  Respiratory: Negative.  Negative for shortness of breath.   Cardiovascular: Negative.  Negative for chest pain and palpitations.  Gastrointestinal: Negative.    Musculoskeletal:  Positive for arthralgias.       She c/o right 5th toe pain  Neurological: Negative.  Negative for headaches.  Psychiatric/Behavioral: Negative.       Today's Vitals   04/29/23 0852  BP: 118/80  Pulse: 75  Temp: 97.8 F (36.6 C)  SpO2: 98%  Weight: 176 lb 6.4 oz (80 kg)  Height: 5' 2 (1.575 m)   Body mass index is 32.26 kg/m.  Wt Readings from Last 3 Encounters:  04/29/23 176 lb 6.4 oz (80 kg)  12/17/22 182 lb 9.6 oz (82.8 kg)  08/14/22 191 lb (86.6 kg)     Objective:  Physical Exam Vitals and nursing note reviewed.  Constitutional:      Appearance: Normal appearance.  HENT:     Head: Normocephalic and atraumatic.  Cardiovascular:     Rate and Rhythm: Normal rate and regular rhythm.     Pulses:          Dorsalis pedis pulses are 2+ on the right side.     Heart sounds: Normal heart sounds.  Pulmonary:     Effort: Pulmonary effort is normal.     Breath sounds: Normal breath sounds.  Musculoskeletal:     Right foot: Deformity present.  Feet:     Right foot:     Toenail Condition: Right toenails are normal.  Skin:    General: Skin is warm.     Comments: Hyperpigmented, raised lesions on face/neck  Neurological:     General: No focal deficit present.     Mental Status: She is alert.  Psychiatric:        Mood and Affect: Mood normal.        Behavior: Behavior normal.         Assessment And Plan:  Type 2 diabetes mellitus with hypercholesterolemia (HCC) Assessment & Plan: Chronic, LDL goal is less than 70. She will continue with atorvastatin  40mg  daily.  She is taking Mounjaro  15mg  weekly for her diabetes. She would like to continue at this dose. She will f/u in 3-4 months for re-evaluation.   Orders: -     CBC -     CMP14+EGFR -     Lipid panel -     Hemoglobin A1c -     Mounjaro ; Inject 15 mg into the skin once a week.  Dispense: 2 mL; Refill: 3  Essential hypertension, benign Assessment & Plan: Chronic, fair control.  She will  continue with valsartan  160mg  daily. She is reminded to follow a low sodium diet.   Orders: -     CMP14+EGFR  Pain of toe of right foot -     Ambulatory referral to Podiatry  Dermatosis papulosa nigra -     Ambulatory referral to Dermatology  Class 1 obesity due to excess calories with serious comorbidity and body mass index (BMI) of 32.0 to 32.9 in adult Assessment & Plan: She is encouraged to strive for BMI less than 30 to decrease cardiac risk. Advised to aim for at least 150 minutes  of exercise per week.    She is encouraged to strive for BMI less than 30 to decrease cardiac risk. Advised to aim for at least 150 minutes of exercise per week.    Return if symptoms worsen or fail to improve.  Patient was given opportunity to ask questions. Patient verbalized understanding of the plan and was able to repeat key elements of the plan. All questions were answered to their satisfaction.    I, Sue LOISE Slocumb, MD, have reviewed all documentation for this visit. The documentation on 04/30/23 for the exam, diagnosis, procedures, and orders are all accurate and complete.   IF YOU HAVE BEEN REFERRED TO A SPECIALIST, IT MAY TAKE 1-2 WEEKS TO SCHEDULE/PROCESS THE REFERRAL. IF YOU HAVE NOT HEARD FROM US /SPECIALIST IN TWO WEEKS, PLEASE GIVE US  A CALL AT [phone].   THE PATIENT IS ENCOURAGED TO PRACTICE SOCIAL DISTANCING DUE TO THE COVID-19 PANDEMIC.

## 2023-04-30 ENCOUNTER — Other Ambulatory Visit: Payer: Self-pay

## 2023-04-30 DIAGNOSIS — M79674 Pain in right toe(s): Secondary | ICD-10-CM | POA: Insufficient documentation

## 2023-04-30 LAB — CBC
Hematocrit: 41.2 % (ref 34.0–46.6)
Hemoglobin: 12.5 g/dL (ref 11.1–15.9)
MCH: 26.4 pg — ABNORMAL LOW (ref 26.6–33.0)
MCHC: 30.3 g/dL — ABNORMAL LOW (ref 31.5–35.7)
MCV: 87 fL (ref 79–97)
Platelets: 328 10*3/uL (ref 150–450)
RBC: 4.74 x10E6/uL (ref 3.77–5.28)
RDW: 13.2 % (ref 11.7–15.4)
WBC: 4.9 10*3/uL (ref 3.4–10.8)

## 2023-04-30 LAB — CMP14+EGFR
ALT: 15 [IU]/L (ref 0–32)
AST: 17 [IU]/L (ref 0–40)
Albumin: 4.6 g/dL (ref 3.8–4.9)
Alkaline Phosphatase: 83 [IU]/L (ref 44–121)
BUN/Creatinine Ratio: 18 (ref 9–23)
BUN: 16 mg/dL (ref 6–24)
Bilirubin Total: 0.3 mg/dL (ref 0.0–1.2)
CO2: 25 mmol/L (ref 20–29)
Calcium: 9.5 mg/dL (ref 8.7–10.2)
Chloride: 103 mmol/L (ref 96–106)
Creatinine, Ser: 0.87 mg/dL (ref 0.57–1.00)
Globulin, Total: 2.2 g/dL (ref 1.5–4.5)
Glucose: 77 mg/dL (ref 70–99)
Potassium: 4.3 mmol/L (ref 3.5–5.2)
Sodium: 144 mmol/L (ref 134–144)
Total Protein: 6.8 g/dL (ref 6.0–8.5)
eGFR: 80 mL/min/{1.73_m2} (ref >59)

## 2023-04-30 LAB — LIPID PANEL
Chol/HDL Ratio: 2.3 {ratio} (ref 0.0–4.4)
Cholesterol, Total: 188 mg/dL (ref 100–199)
HDL: 83 mg/dL (ref >39)
LDL Chol Calc (NIH): 94 mg/dL (ref 0–99)
Triglycerides: 58 mg/dL (ref 0–149)
VLDL Cholesterol Cal: 11 mg/dL (ref 5–40)

## 2023-04-30 LAB — HEMOGLOBIN A1C
Est. average glucose Bld gHb Est-mCnc: 120 mg/dL
Hgb A1c MFr Bld: 5.8 % — ABNORMAL HIGH (ref 4.8–5.6)

## 2023-04-30 NOTE — Assessment & Plan Note (Signed)
 Chronic, fair control.  She will continue with valsartan  160mg  daily. She is reminded to follow a low sodium diet.

## 2023-04-30 NOTE — Assessment & Plan Note (Signed)
 Chronic, LDL goal is less than 70. She will continue with atorvastatin  40mg  daily.  She is taking Mounjaro  15mg  weekly for her diabetes. She would like to continue at this dose. She will f/u in 3-4 months for re-evaluation.

## 2023-04-30 NOTE — Assessment & Plan Note (Signed)
 She is encouraged to strive for BMI less than 30 to decrease cardiac risk. Advised to aim for at least 150 minutes of exercise per week.

## 2023-05-01 ENCOUNTER — Other Ambulatory Visit (HOSPITAL_COMMUNITY): Payer: Self-pay

## 2023-05-01 MED ORDER — DAYVIGO 10 MG PO TABS
1.0000 | ORAL_TABLET | Freq: Every evening | ORAL | 2 refills | Status: DC | PRN
  Filled 2023-05-01: qty 30, 30d supply, fill #0
  Filled 2023-06-01: qty 30, 30d supply, fill #1
  Filled 2023-07-28: qty 30, 30d supply, fill #2

## 2023-05-02 ENCOUNTER — Other Ambulatory Visit (HOSPITAL_COMMUNITY): Payer: Self-pay

## 2023-05-09 ENCOUNTER — Ambulatory Visit: Payer: 59 | Admitting: Podiatry

## 2023-05-09 ENCOUNTER — Ambulatory Visit (INDEPENDENT_AMBULATORY_CARE_PROVIDER_SITE_OTHER): Payer: 59

## 2023-05-09 DIAGNOSIS — M79674 Pain in right toe(s): Secondary | ICD-10-CM

## 2023-05-09 DIAGNOSIS — G8929 Other chronic pain: Secondary | ICD-10-CM | POA: Diagnosis not present

## 2023-05-09 DIAGNOSIS — M7751 Other enthesopathy of right foot: Secondary | ICD-10-CM | POA: Diagnosis not present

## 2023-05-12 NOTE — Progress Notes (Signed)
Subjective:   Patient ID: Sue Bell, female   DOB: 53 y.o.   MRN: [id number]   HPI Patient presents with painful corn right fifth toe with history of having surgery on the left fifth toe by [place] a number of years ago.  States it has been hurting for a few months and has tried wider shoes trimming   Review of Systems  All other systems reviewed and are negative.       Objective:  Physical Exam Vitals and nursing note reviewed.  Constitutional:      Appearance: She is well-developed.  Pulmonary:     Effort: Pulmonary effort is normal.  Musculoskeletal:        General: Normal range of motion.  Skin:    General: Skin is warm.  Neurological:     Mental Status: She is alert.     Neurovascular status intact muscle strength adequate range of motion adequate with inflamed fifth digit right distal joint with fluid buildup around the joint surface.  Diabetes under good control and good digital perfusion with history of surgery left     Assessment:  Inflammatory fifth digit right with capsulitis and pain     Plan:  H&P reviewed discussed that this may require surgery ultimately but organ to try conservative and I went ahead did sterile prep and injected the inner phalangeal joint of digit 5 right 2 mg Dexasone Kenalog 5 g Liken debrided lesion applied cushioning reappoint as needed  X-rays indicate there is rotation of the fifth digit right with pressure against the lateral side

## 2023-05-14 ENCOUNTER — Encounter (HOSPITAL_COMMUNITY): Payer: Self-pay | Admitting: *Deleted

## 2023-06-02 ENCOUNTER — Other Ambulatory Visit: Payer: Self-pay

## 2023-06-10 ENCOUNTER — Encounter: Payer: Self-pay | Admitting: Internal Medicine

## 2023-06-10 ENCOUNTER — Other Ambulatory Visit (HOSPITAL_COMMUNITY): Payer: Self-pay

## 2023-06-10 ENCOUNTER — Ambulatory Visit: Payer: 59 | Admitting: Internal Medicine

## 2023-06-10 VITALS — BP 110/80 | HR 98 | Temp 98.1°F | Ht 62.0 in | Wt 182.4 lb

## 2023-06-10 DIAGNOSIS — W100XXA Fall (on)(from) escalator, initial encounter: Secondary | ICD-10-CM | POA: Diagnosis not present

## 2023-06-10 DIAGNOSIS — S20411A Abrasion of right back wall of thorax, initial encounter: Secondary | ICD-10-CM

## 2023-06-10 DIAGNOSIS — W19XXXA Unspecified fall, initial encounter: Secondary | ICD-10-CM

## 2023-06-10 DIAGNOSIS — M533 Sacrococcygeal disorders, not elsewhere classified: Secondary | ICD-10-CM

## 2023-06-10 DIAGNOSIS — Z6833 Body mass index (BMI) 33.0-33.9, adult: Secondary | ICD-10-CM

## 2023-06-10 MED ORDER — HYDROCODONE-ACETAMINOPHEN 5-325 MG PO TABS
1.0000 | ORAL_TABLET | Freq: Four times a day (QID) | ORAL | 0 refills | Status: DC | PRN
  Filled 2023-06-10: qty 20, 5d supply, fill #0

## 2023-06-10 MED ORDER — FLUCONAZOLE 100 MG PO TABS
100.0000 mg | ORAL_TABLET | ORAL | 0 refills | Status: DC
Start: 2023-06-10 — End: 2023-08-18
  Filled 2023-06-10: qty 2, 4d supply, fill #0

## 2023-06-10 MED ORDER — CEPHALEXIN 500 MG PO CAPS
500.0000 mg | ORAL_CAPSULE | Freq: Three times a day (TID) | ORAL | 0 refills | Status: AC
Start: 2023-06-10 — End: 2023-06-20
  Filled 2023-06-10: qty 30, 10d supply, fill #0

## 2023-06-10 NOTE — Progress Notes (Signed)
 I,Victoria T Deloria Lair, CMA,acting as a Neurosurgeon for [name], MD.,have documented all relevant documentation on the behalf of [name], MD,as directed by  [name], MD while in the presence of [name], MD.  Subjective:  Patient ID: Sue Bell , female    DOB: May 04, 1970 , 53 y.o.   MRN: [id number]  Chief Complaint  Patient presents with   Fall         HPI  Patient presents today for further evaluation after a fall. She reports this incident happened over the weekend. She happened to be in [place] at a mall with her 49 year old aunt. She states she & her aunt were getting on the escalator, when her aunt missed a step. Her aunt fell backwards and knocked her down on a moving escalator.  Unfortunately, it took awhile for someone to stop the escalator.  She did not seek medical attention after the event, but she did take her aunt to get evaluation.    Today, she c/o tailbone pain and scrapes on her shoulder. She wants to make sure nothing is infected. The pain is 7/10 today.   She has taken Tylenol muscle and aches with minimal relief of her sx.  .      Past Medical History:  Diagnosis Date   Diabetes mellitus without complication (HCC)    Hyperlipidemia    Hypertension    OSA (obstructive sleep apnea) 07/08/2016     Family History  Problem Relation Age of Onset   Heart disease Mother    Diabetes Father    Cancer Father    Breast cancer Sister      Current Outpatient Medications:    cephALEXin (KEFLEX) 500 MG capsule, Take 1 capsule (500 mg total) by mouth 3 (three) times daily for 10 days, Disp: 30 capsule, Rfl: 0   fluconazole (DIFLUCAN) 100 MG tablet, Take 1 tablet today, repeat 1 tablet in 48 hours, Disp: 2 tablet, Rfl: 0   HYDROcodone-acetaminophen (NORCO/VICODIN) 5-325 MG tablet, Take 1 tablet by mouth every 6 (six) hours as needed for moderate pain (pain score 4-6)., Disp: 20 tablet, Rfl: 0   atorvastatin (LIPITOR) 40 MG tablet, Take 1 tablet (40  mg total) by mouth daily., Disp: 90 tablet, Rfl: 2   diazepam (VALIUM) 5 MG tablet, Take 1 tablet (5 mg total) by mouth every 12 (twelve) hours as needed for anxiety (flight anxiety)., Disp: 10 tablet, Rfl: 0   Lemborexant (DAYVIGO) 10 MG TABS, Take 1 tablet (10 mg total) by mouth at bedtime as needed., Disp: 30 tablet, Rfl: 2   levocetirizine (XYZAL) 5 MG tablet, Take 1 tablet (5 mg total) by mouth every evening., Disp: 90 tablet, Rfl: 2   mometasone (NASONEX) 50 MCG/ACT nasal spray, Place 1 spray into the nose daily., Disp: 17 g, Rfl: 3   tirzepatide (MOUNJARO) 15 MG/0.5ML Pen, Inject 15 mg into the skin once a week., Disp: 2 mL, Rfl: 3   valsartan (DIOVAN) 160 MG tablet, Take 1 tablet (160 mg total) by mouth daily., Disp: 90 tablet, Rfl: 1   Allergies  Allergen Reactions   Sulfa Antibiotics Hives     Review of Systems  Constitutional: Negative.   Respiratory: Negative.    Cardiovascular: Negative.   Gastrointestinal: Negative.   Musculoskeletal:  Positive for arthralgias and back pain.       Had soreness around her tailbone  Neurological: Negative.   Psychiatric/Behavioral: Negative.       Today's Vitals  06/10/23 1609  BP: 110/80  Pulse: 98  Temp: 98.1 F (36.7 C)  SpO2: 98%  Weight: 182 lb 6.4 oz (82.7 kg)  Height: 5\' 2"  (1.575 m)   Body mass index is 33.36 kg/m.  Wt Readings from Last 3 Encounters:  06/10/23 182 lb 6.4 oz (82.7 kg)  04/29/23 176 lb 6.4 oz (80 kg)  12/17/22 182 lb 9.6 oz (82.8 kg)     Objective:  Physical Exam Vitals and nursing note reviewed.  Constitutional:      Appearance: Normal appearance.  HENT:     Head: Normocephalic and atraumatic.  Eyes:     Extraocular Movements: Extraocular movements intact.  Cardiovascular:     Rate and Rhythm: Normal rate and regular rhythm.     Heart sounds: Normal heart sounds.  Pulmonary:     Effort: Pulmonary effort is normal.     Breath sounds: Normal breath sounds.  Musculoskeletal:        General:  Tenderness present.     Comments: Sacral area is tender to palpation  Skin:    General: Skin is warm.     Comments: Abrasions right shoulder with surrounding erythema.    Neurological:     General: No focal deficit present.     Mental Status: She is alert.  Psychiatric:        Mood and Affect: Mood normal.        Behavior: Behavior normal.         Assessment And Plan:  Fall on or from escalator, initial encounter Assessment & Plan: Occurred on 2/22 - fell while going up escalator in [place] while visiting [place], [place]. She is encouraged to hold onto railing when riding escalator, with both feet on the same step. She did suffer some abrasions, will send rx abx    Abrasion of right upper back excluding scapular region, initial encounter Assessment & Plan: Occurred on 2/22, she was given rx antibiotics. Advised to take the full course. She is UTD with TDAp, given less than 18 months ago.    Sacral pain Assessment & Plan: Will send her for sacrum/coccygeal x-ray.  Advised to apply topical lidocaine patch to affected area. PDMP reviewed, will also send rx Norco to use prn. 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    Other orders -     Cephalexin; Take 1 capsule (500 mg total) by mouth 3 (three) times daily for 10 days  Dispense: 30 capsule; Refill: 0 -     Fluconazole; Take 1 tablet today, repeat 1 tablet in 48 hours  Dispense: 2 tablet; Refill: 0 -     HYDROcodone-Acetaminophen; Take 1 tablet by mouth every 6 (six) hours as needed for moderate pain (pain score 4-6).  Dispense: 20 tablet; Refill: 0  She is encouraged to strive for BMI less than 30 to decrease cardiac risk. Advised to aim for at least 150 minutes of exercise per week.    Return if symptoms worsen or fail to improve.  Patient was given opportunity to ask questions. Patient verbalized understanding of the plan and was able to repeat key elements of the plan. All questions were answered to their  satisfaction.    I, [name], MD, have reviewed all documentation for this visit. The documentation on 06/10/23 for the exam, diagnosis, procedures, and orders are all accurate and complete.   IF YOU HAVE BEEN REFERRED TO A SPECIALIST, IT MAY TAKE 1-2 WEEKS TO SCHEDULE/PROCESS THE REFERRAL. IF YOU  HAVE NOT HEARD FROM US/SPECIALIST IN TWO WEEKS, PLEASE GIVE [place] A CALL AT [phone].   THE PATIENT IS ENCOURAGED TO PRACTICE SOCIAL DISTANCING DUE TO THE COVID-19 PANDEMIC.

## 2023-06-10 NOTE — Patient Instructions (Signed)
 Understanding Your Risk for Falls Millions of people have serious injuries from falls each year. It is important to understand your risk of falling. Talk with your health care provider about your risk and what you can do to lower it. If you do have a serious fall, make sure to tell your provider. Falling once raises your risk of falling again. How can falls affect me? Serious injuries from falls are common. These include: Broken bones, such as hip fractures. Head injuries, such as traumatic brain injuries (TBI) or concussions. A fear of falling can cause you to avoid activities and stay at home. This can make your muscles weaker and raise your risk for a fall. What can increase my risk? There are a number of risk factors that increase your risk for falling. The more risk factors you have, the higher your risk of falling. Serious injuries from a fall happen most often to people who are older than 54 years old. Teenagers and young adults ages 57-29 are also at higher risk. Common risk factors include: Weakness in the lower body. Being generally weak or confused due to long-term (chronic) illness. Dizziness or balance problems. Poor vision. Medicines that cause dizziness or drowsiness. These may include: Medicines for your blood pressure, heart, anxiety, insomnia, or swelling (edema). Pain medicines. Muscle relaxants. Other risk factors include: Drinking alcohol. Having had a fall in the past. Having foot pain or wearing improper footwear. Working at a dangerous job. Having any of the following in your home: Tripping hazards, such as floor clutter or loose rugs. Poor lighting. Pets. Having dementia or memory loss. What actions can I take to lower my risk of falling?     Physical activity Stay physically fit. Do strength and balance exercises. Consider taking a regular class to build strength and balance. Yoga and tai chi are good options. Vision Have your eyes checked every year and  your prescription for glasses or contacts updated as needed. Shoes and walking aids Wear non-skid shoes. Wear shoes that have rubber soles and low heels. Do not wear high heels. Do not walk around the house in socks or slippers. Use a cane or walker as told by your provider. Home safety Attach secure railings on both sides of your stairs. Install grab bars for your bathtub, shower, and toilet. Use a non-skid mat in your bathtub or shower. Attach bath mats securely with double-sided, non-slip rug tape. Use good lighting in all rooms. Keep a flashlight near your bed. Make sure there is a clear path from your bed to the bathroom. Use night-lights. Do not use throw rugs. Make sure all carpeting is taped or tacked down securely. Remove all clutter from walkways and stairways, including extension cords. Repair uneven or broken steps and floors. Avoid walking on icy or slippery surfaces. Walk on the grass instead of on icy or slick sidewalks. Use ice melter to get rid of ice on walkways in the winter. Use a cordless phone. Questions to ask your health care provider Can you help me check my risk for a fall? Do any of my medicines make me more likely to fall? Should I take a vitamin D supplement? What exercises can I do to improve my strength and balance? Should I make an appointment to have my vision checked? Do I need a bone density test to check for weak bones (osteoporosis)? Would it help to use a cane or a walker? Where to find more information Centers for Disease Control and Prevention, STEAD[employer]: TonerPromos.no  Community-Based Fall Prevention Programs: TonerPromos.no [employer] on Aging: BaseRingTones.pl Contact a health care provider if: You fall at home. You are afraid of falling at home. You feel weak, drowsy, or dizzy. This information is not intended to replace advice given to you by your health care provider. Make sure you discuss any questions you have with your health care  provider. Document Revised: 12/03/2021 Document Reviewed: 12/03/2021 Elsevier Patient Education  2024 [employer].

## 2023-06-11 ENCOUNTER — Encounter: Payer: Self-pay | Admitting: Internal Medicine

## 2023-06-12 ENCOUNTER — Other Ambulatory Visit: Payer: Self-pay | Admitting: Internal Medicine

## 2023-06-12 DIAGNOSIS — M533 Sacrococcygeal disorders, not elsewhere classified: Secondary | ICD-10-CM

## 2023-06-13 ENCOUNTER — Ambulatory Visit
Admission: RE | Admit: 2023-06-13 | Discharge: 2023-06-13 | Disposition: A | Discharge status: Home / Self Care | Payer: 59 | Source: Ambulatory Visit | Attending: Internal Medicine | Admitting: Internal Medicine

## 2023-06-13 ENCOUNTER — Other Ambulatory Visit: Payer: Self-pay | Admitting: Internal Medicine

## 2023-06-13 DIAGNOSIS — W19XXXA Unspecified fall, initial encounter: Secondary | ICD-10-CM

## 2023-06-13 DIAGNOSIS — M533 Sacrococcygeal disorders, not elsewhere classified: Secondary | ICD-10-CM

## 2023-06-16 ENCOUNTER — Encounter: Payer: Self-pay | Admitting: Internal Medicine

## 2023-06-16 DIAGNOSIS — W100XXA Fall (on)(from) escalator, initial encounter: Secondary | ICD-10-CM | POA: Insufficient documentation

## 2023-06-16 DIAGNOSIS — S20411A Abrasion of right back wall of thorax, initial encounter: Secondary | ICD-10-CM | POA: Insufficient documentation

## 2023-06-16 DIAGNOSIS — M533 Sacrococcygeal disorders, not elsewhere classified: Secondary | ICD-10-CM | POA: Insufficient documentation

## 2023-06-16 NOTE — Assessment & Plan Note (Signed)
 Occurred on 2/22 - fell while going up escalator in [place] while visiting [place], [place]. She is encouraged to hold onto railing when riding escalator, with both feet on the same step. She did suffer some abrasions, will send rx abx

## 2023-06-16 NOTE — Assessment & Plan Note (Addendum)
 Will send her for sacrum/coccygeal x-ray.  Advised to apply topical lidocaine patch to affected area. PDMP reviewed, will also send rx Norco to use prn. 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

## 2023-06-16 NOTE — Assessment & Plan Note (Signed)
 Occurred on 2/22, she was given rx antibiotics. Advised to take the full course. She is UTD with TDAp, given less than 18 months ago.

## 2023-07-24 ENCOUNTER — Other Ambulatory Visit (HOSPITAL_COMMUNITY): Payer: Self-pay

## 2023-07-29 ENCOUNTER — Encounter: Payer: Self-pay | Admitting: Internal Medicine

## 2023-07-29 ENCOUNTER — Other Ambulatory Visit: Payer: Self-pay

## 2023-07-29 ENCOUNTER — Other Ambulatory Visit: Payer: Self-pay | Admitting: Internal Medicine

## 2023-07-29 ENCOUNTER — Other Ambulatory Visit (HOSPITAL_COMMUNITY): Payer: Self-pay

## 2023-07-29 MED ORDER — DIAZEPAM 5 MG PO TABS
5.0000 mg | ORAL_TABLET | Freq: Two times a day (BID) | ORAL | 0 refills | Status: AC | PRN
  Filled 2023-07-29: qty 10, 5d supply, fill #0

## 2023-07-29 MED ORDER — NOREL AD 4-10-325 MG PO TABS
1.0000 | ORAL_TABLET | Freq: Two times a day (BID) | ORAL | 0 refills | Status: DC | PRN
  Filled 2023-07-29: qty 20, 10d supply, fill #0

## 2023-07-30 ENCOUNTER — Other Ambulatory Visit: Payer: Self-pay

## 2023-08-18 ENCOUNTER — Ambulatory Visit (INDEPENDENT_AMBULATORY_CARE_PROVIDER_SITE_OTHER): Payer: BC Managed Care – PPO | Admitting: Internal Medicine

## 2023-08-18 ENCOUNTER — Other Ambulatory Visit (HOSPITAL_COMMUNITY): Payer: Self-pay

## 2023-08-18 ENCOUNTER — Encounter: Payer: Self-pay | Admitting: Internal Medicine

## 2023-08-18 VITALS — BP 118/80 | HR 85 | Temp 97.3°F | Ht 62.0 in | Wt 178.6 lb

## 2023-08-18 DIAGNOSIS — Z0000 Encounter for general adult medical examination without abnormal findings: Secondary | ICD-10-CM | POA: Diagnosis not present

## 2023-08-18 DIAGNOSIS — E6609 Other obesity due to excess calories: Secondary | ICD-10-CM

## 2023-08-18 DIAGNOSIS — E7800 Pure hypercholesterolemia, unspecified: Secondary | ICD-10-CM

## 2023-08-18 DIAGNOSIS — Z6832 Body mass index (BMI) 32.0-32.9, adult: Secondary | ICD-10-CM

## 2023-08-18 DIAGNOSIS — E1169 Type 2 diabetes mellitus with other specified complication: Secondary | ICD-10-CM | POA: Diagnosis not present

## 2023-08-18 DIAGNOSIS — F5101 Primary insomnia: Secondary | ICD-10-CM

## 2023-08-18 DIAGNOSIS — J301 Allergic rhinitis due to pollen: Secondary | ICD-10-CM

## 2023-08-18 DIAGNOSIS — E66811 Obesity, class 1: Secondary | ICD-10-CM

## 2023-08-18 DIAGNOSIS — I10 Essential (primary) hypertension: Secondary | ICD-10-CM

## 2023-08-18 MED ORDER — LEVOCETIRIZINE DIHYDROCHLORIDE 5 MG PO TABS
5.0000 mg | ORAL_TABLET | Freq: Every evening | ORAL | 2 refills | Status: DC
Start: 2023-08-18 — End: 2024-01-08
  Filled 2023-08-18 – 2023-12-19 (×2): qty 90, 90d supply, fill #0

## 2023-08-18 MED ORDER — ATORVASTATIN CALCIUM 40 MG PO TABS
40.0000 mg | ORAL_TABLET | Freq: Every day | ORAL | 2 refills | Status: AC
  Filled 2023-08-18: qty 90, 90d supply, fill #0

## 2023-08-18 MED ORDER — VALSARTAN 160 MG PO TABS
160.0000 mg | ORAL_TABLET | Freq: Every day | ORAL | 1 refills | Status: DC
  Filled 2023-08-18: qty 90, 90d supply, fill #0

## 2023-08-18 MED ORDER — MOUNJARO 15 MG/0.5ML ~~LOC~~ SOAJ
15.0000 mg | SUBCUTANEOUS | 3 refills | Status: DC
  Filled 2023-08-18: qty 2, 28d supply, fill #0
  Filled 2023-09-18: qty 2, 28d supply, fill #1
  Filled 2023-10-27: qty 2, 28d supply, fill #2
  Filled 2023-11-24: qty 2, 28d supply, fill #3

## 2023-08-18 MED ORDER — DAYVIGO 10 MG PO TABS
1.0000 | ORAL_TABLET | Freq: Every evening | ORAL | 2 refills | Status: DC | PRN
  Filled 2023-08-18 – 2023-09-15 (×3): qty 30, 30d supply, fill #0
  Filled 2023-10-27: qty 30, 30d supply, fill #1
  Filled 2023-11-24: qty 30, 30d supply, fill #2

## 2023-08-18 MED ORDER — MOMETASONE FUROATE 50 MCG/ACT NA SUSP
1.0000 | Freq: Every day | NASAL | 3 refills | Status: AC
  Filled 2023-08-18: qty 17, 60d supply, fill #0
  Filled 2024-04-04: qty 17, 60d supply, fill #1

## 2023-08-18 NOTE — Assessment & Plan Note (Addendum)
 She is encouraged to strive for BMI less than 30 to decrease cardiac risk. Advised to aim for at least 150 minutes of exercise per week.

## 2023-08-18 NOTE — Assessment & Plan Note (Signed)

## 2023-08-18 NOTE — Progress Notes (Unsigned)
 I,Sue Bell, CMA,acting as a Neurosurgeon for Sue Dung, MD.,have documented all relevant documentation on the behalf of Sue Dung, MD,as directed by  Sue Dung, MD while in the presence of Sue Dung, MD.  Subjective:    Patient ID: Sue Bell , female    DOB: 28-Feb-1971 , 53 y.o.   MRN: [id number]  Chief Complaint  Patient presents with   Annual Exam    Patient presents today for annual exam. She reports compliance with medications. Denies headache, chest pain & sob. GYN: Sue Bell.   Diabetes   Hyperlipidemia   Hypertension    HPI Discussed the use of AI scribe software for clinical note transcription with the patient, who gave verbal consent to proceed.  History of Present Illness Sue Bell is a 53 year old female who presents for an annual physical exam and diabetes check.  She is not exercising as much as she would like and plans to start walking more, especially since the weather is cooler this week. She mentions that she will be walking extensively next week during a school trip to [place], which includes visits to [place], [place], and [place].  She has no specific concerns at this time and reports compliance with meds.   Diabetes She presents for her follow-up diabetic visit. She has type 2 diabetes mellitus. There are no hypoglycemic associated symptoms. There are no diabetic associated symptoms. Pertinent negatives for diabetes include no blurred vision and no chest pain. There are no hypoglycemic complications. Risk factors for coronary artery disease include diabetes mellitus, obesity and sedentary lifestyle. She is compliant with treatment most of the time. She participates in exercise intermittently. An ACE inhibitor/angiotensin II receptor blocker is not being taken.  Hypertension This is a chronic problem. The current episode started more than 1 year ago. The problem has been gradually improving since onset. The problem is  controlled. Pertinent negatives include no blurred vision, chest pain, palpitations or shortness of breath. Risk factors for coronary artery disease include diabetes mellitus and dyslipidemia. Past treatments include angiotensin blockers. The current treatment provides moderate improvement. Compliance problems include exercise.      Past Medical History:  Diagnosis Date   Diabetes mellitus without complication (HCC)    Hyperlipidemia    Hypertension    OSA (obstructive sleep apnea) 07/08/2016     Family History  Problem Relation Age of Onset   Heart disease Mother    Diabetes Father    Cancer Father    Breast cancer Sister      Current Outpatient Medications:    Chlorphen-PE-Acetaminophen  (NOREL AD) 4-10-325 MG TABS, Take 1 tablet by mouth 2 (two) times daily as needed., Disp: 20 tablet, Rfl: 0   diazepam  (VALIUM ) 5 MG tablet, Take 1 tablet (5 mg total) by mouth every 12 (twelve) hours as needed for anxiety (flight anxiety)., Disp: 10 tablet, Rfl: 0   atorvastatin  (LIPITOR) 40 MG tablet, Take 1 tablet (40 mg total) by mouth daily., Disp: 90 tablet, Rfl: 2   Lemborexant  (DAYVIGO ) 10 MG TABS, Take 1 tablet (10 mg total) by mouth at bedtime as needed., Disp: 30 tablet, Rfl: 2   levocetirizine (XYZAL ) 5 MG tablet, Take 1 tablet (5 mg total) by mouth every evening., Disp: 90 tablet, Rfl: 2   mometasone  (NASONEX ) 50 MCG/ACT nasal spray, Place 1 spray into the nose daily., Disp: 17 g, Rfl: 3   tirzepatide  (MOUNJARO ) 15 MG/0.5ML Pen, Inject 15 mg into the skin once a  week., Disp: 2 mL, Rfl: 3   valsartan  (DIOVAN ) 160 MG tablet, Take 1 tablet (160 mg total) by mouth daily., Disp: 90 tablet, Rfl: 1   Allergies  Allergen Reactions   Sulfa Antibiotics Hives      The patient states she uses status post hysterectomy for birth control. No LMP recorded. Patient has had a hysterectomy.. Negative for Dysmenorrhea. Negative for: breast discharge, breast lump(s), breast pain and breast self exam.  Associated symptoms include abnormal vaginal bleeding. Pertinent negatives include abnormal bleeding (hematology), anxiety, decreased libido, depression, difficulty falling sleep, dyspareunia, history of infertility, nocturia, sexual dysfunction, sleep disturbances, urinary incontinence, urinary urgency, vaginal discharge and vaginal itching. Diet regular.The patient states her exercise level is  intermittent.  . The patient's tobacco use is:  Social History   Tobacco Use  Smoking Status Never  Smokeless Tobacco Never  . She has been exposed to passive smoke. The patient's alcohol use is:  Social History   Substance and Sexual Activity  Alcohol Use No    Review of Systems  Constitutional: Negative.   HENT: Negative.    Eyes: Negative.  Negative for blurred vision.  Respiratory: Negative.  Negative for shortness of breath.   Cardiovascular: Negative.  Negative for chest pain and palpitations.  Gastrointestinal: Negative.   Endocrine: Negative.   Genitourinary: Negative.   Musculoskeletal: Negative.   Skin: Negative.   Allergic/Immunologic: Negative.   Neurological: Negative.   Hematological: Negative.   Psychiatric/Behavioral: Negative.       Today's Vitals   08/18/23 0953  BP: 118/80  Pulse: 85  Temp: (!) 97.3 F (36.3 C)  SpO2: 98%  Weight: 178 lb 9.6 oz (81 kg)  Height: 5\' 2"  (1.575 m)   Body mass index is 32.67 kg/m.  Wt Readings from Last 3 Encounters:  08/18/23 178 lb 9.6 oz (81 kg)  06/10/23 182 lb 6.4 oz (82.7 kg)  04/29/23 176 lb 6.4 oz (80 kg)     Objective:  Physical Exam Vitals and nursing note reviewed.  Constitutional:      Appearance: Normal appearance. She is obese.  HENT:     Head: Normocephalic and atraumatic.     Right Ear: Tympanic membrane, ear canal and external ear normal.     Left Ear: Tympanic membrane, ear canal and external ear normal.     Mouth/Throat:     Mouth: Mucous membranes are moist.     Pharynx: Oropharynx is clear.   Eyes:     Extraocular Movements: Extraocular movements intact.     Conjunctiva/sclera: Conjunctivae normal.     Pupils: Pupils are equal, round, and reactive to light.  Cardiovascular:     Rate and Rhythm: Normal rate and regular rhythm.     Pulses: Normal pulses.          Dorsalis pedis pulses are 2+ on the right side and 2+ on the left side.     Heart sounds: Normal heart sounds.  Pulmonary:     Effort: Pulmonary effort is normal.     Breath sounds: Normal breath sounds.  Chest:  Breasts:    Tanner Score is 5.     Right: Normal.     Left: Normal.     Comments: Healed surgical scars b/l Abdominal:     General: Bowel sounds are normal.     Palpations: Abdomen is soft.  Genitourinary:    Comments: deferred Musculoskeletal:        General: Normal range of motion.     Cervical  back: Normal range of motion and neck supple.  Feet:     Right foot:     Protective Sensation: 5 sites tested.  5 sites sensed.     Skin integrity: Dry skin present.     Toenail Condition: Right toenails are normal.     Left foot:     Protective Sensation: 5 sites tested.  5 sites sensed.     Skin integrity: Dry skin present.     Toenail Condition: Left toenails are normal.  Skin:    General: Skin is warm and dry.  Neurological:     General: No focal deficit present.     Mental Status: She is alert and oriented to person, place, and time.  Psychiatric:        Mood and Affect: Mood normal.        Behavior: Behavior normal.      Assessment And Plan:     Encounter for annual health examination Assessment & Plan: A full exam was performed.  Importance of monthly self breast exams was discussed with the patient.  She is advised to get 30-45 minutes of regular exercise, no less than four to five days per week. Both weight-bearing and aerobic exercises are recommended.  She is advised to follow a healthy diet with at least six fruits/veggies per day, decrease intake of red meat and other saturated fats  and to increase fish intake to twice weekly.  Meats/fish should not be fried -- baked, boiled or broiled is preferable. It is also important to cut back on your sugar intake.  Be sure to read labels - try to avoid anything with added sugar, high fructose corn syrup or other sweeteners.  If you must use a sweetener, you can try stevia or monkfruit.  It is also important to avoid artificially sweetened foods/beverages and diet drinks. Lastly, wear SPF 50 sunscreen on exposed skin and when in direct sunlight for an extended period of time.  Be sure to avoid fast food restaurants and aim for at least 60 ounces of water daily.      Orders: -     Hemoglobin A1c -     BMP8+eGFR -     TSH  Type 2 diabetes mellitus with hypercholesterolemia (HCC) Assessment & Plan: Chronic, diabetic foot exam was performed.  She will continue with Mounjaro  weekly. Importance of medication/dietary compliance was discussed with the patient. She agrees to rto in 4 months for re-evaluation.  I DISCUSSED WITH THE PATIENT AT LENGTH REGARDING THE GOALS OF GLYCEMIC CONTROL AND POSSIBLE LONG-TERM COMPLICATIONS.  I  ALSO STRESSED THE IMPORTANCE OF COMPLIANCE WITH HOME GLUCOSE MONITORING, DIETARY RESTRICTIONS INCLUDING AVOIDANCE OF SUGARY DRINKS/PROCESSED FOODS,  ALONG WITH REGULAR EXERCISE.  I  ALSO STRESSED THE IMPORTANCE OF ANNUAL EYE EXAMS, SELF FOOT CARE AND COMPLIANCE WITH OFFICE VISITS.   Orders: -     EKG 12-Lead -     Microalbumin / creatinine urine ratio -     Mounjaro ; Inject 15 mg into the skin once a week.  Dispense: 2 mL; Refill: 3 -     Lipoprotein A (LPA) -     Atorvastatin  Calcium ; Take 1 tablet (40 mg total) by mouth daily.  Dispense: 90 tablet; Refill: 2  Essential hypertension, benign Assessment & Plan: Chronic, fair control.  EKG performed, NSR w/o acute changes. She will continue with valsartan  160mg  daily. She is reminded to follow a low sodium diet. She was also reminded of the renal protective benefits of  the  valsartan .   Orders: -     Valsartan ; Take 1 tablet (160 mg total) by mouth daily.  Dispense: 90 tablet; Refill: 1  Seasonal allergic rhinitis due to pollen Assessment & Plan: Chronic, she will continue with Xyzal  daily. She uses Nasonex  NS prn.   Orders: -     Levocetirizine Dihydrochloride ; Take 1 tablet (5 mg total) by mouth every evening.  Dispense: 90 tablet; Refill: 2  Primary insomnia Assessment & Plan: Chronic, she was given rx Dayvigo  10mg  po qhs prn. She was reminded on importance of having bedtime routine.   Orders: -     DayVigo ; Take 1 tablet (10 mg total) by mouth at bedtime as needed.  Dispense: 30 tablet; Refill: 2  Class 1 obesity due to excess calories with serious comorbidity and body mass index (BMI) of 32.0 to 32.9 in adult Assessment & Plan: She is encouraged to strive for BMI less than 30 to decrease cardiac risk. Advised to aim for at least 150 minutes of exercise per week.    Other orders -     Mometasone  Furoate; Place 1 spray into the nose daily.  Dispense: 17 g; Refill: 3   Return for 1 year HM, 4 month dm f/u.Aaron Aas Patient was given opportunity to ask questions. Patient verbalized understanding of the plan and was able to repeat key elements of the plan. All questions were answered to their satisfaction.    I, Sue Dung, MD, have reviewed all documentation for this visit. The documentation on 08/18/23 for the exam, diagnosis, procedures, and orders are all accurate and complete.

## 2023-08-18 NOTE — Patient Instructions (Signed)

## 2023-08-19 ENCOUNTER — Other Ambulatory Visit (HOSPITAL_COMMUNITY): Payer: Self-pay

## 2023-08-19 LAB — MICROALBUMIN / CREATININE URINE RATIO
Creatinine, Urine: 187.7 mg/dL
Microalb/Creat Ratio: 4 mg/g{creat} (ref 0–29)
Microalbumin, Urine: 8.4 ug/mL

## 2023-08-19 LAB — BMP8+EGFR
BUN/Creatinine Ratio: 13 (ref 9–23)
BUN: 10 mg/dL (ref 6–24)
CO2: 21 mmol/L (ref 20–29)
Calcium: 9.8 mg/dL (ref 8.7–10.2)
Chloride: 104 mmol/L (ref 96–106)
Creatinine, Ser: 0.79 mg/dL (ref 0.57–1.00)
Glucose: 72 mg/dL (ref 70–99)
Potassium: 4.2 mmol/L (ref 3.5–5.2)
Sodium: 141 mmol/L (ref 134–144)
eGFR: 89 mL/min/{1.73_m2} (ref >59)

## 2023-08-19 LAB — LIPOPROTEIN A (LPA): Lipoprotein (a): 34.9 nmol/L (ref <75.0)

## 2023-08-19 LAB — TSH: TSH: 1.85 u[IU]/mL (ref 0.450–4.500)

## 2023-08-19 LAB — HEMOGLOBIN A1C
Est. average glucose Bld gHb Est-mCnc: 114 mg/dL
Hgb A1c MFr Bld: 5.6 % (ref 4.8–5.6)

## 2023-08-19 NOTE — Assessment & Plan Note (Signed)
Chronic, she will continue with Xyzal daily. She uses Nasonex NS prn.

## 2023-08-19 NOTE — Assessment & Plan Note (Signed)
 Chronic, fair control.  EKG performed, NSR w/o acute changes. She will continue with valsartan  160mg  daily. She is reminded to follow a low sodium diet. She was also reminded of the renal protective benefits of the valsartan .

## 2023-08-19 NOTE — Assessment & Plan Note (Signed)
Chronic, she was given rx Dayvigo 10mg  po qhs prn. She was reminded on importance of having bedtime routine.

## 2023-08-19 NOTE — Assessment & Plan Note (Signed)
 Chronic, diabetic foot exam was performed.  She will continue with Mounjaro  weekly. Importance of medication/dietary compliance was discussed with the patient. She agrees to rto in 4 months for re-evaluation.  I DISCUSSED WITH THE PATIENT AT LENGTH REGARDING THE GOALS OF GLYCEMIC CONTROL AND POSSIBLE LONG-TERM COMPLICATIONS.  I  ALSO STRESSED THE IMPORTANCE OF COMPLIANCE WITH HOME GLUCOSE MONITORING, DIETARY RESTRICTIONS INCLUDING AVOIDANCE OF SUGARY DRINKS/PROCESSED FOODS,  ALONG WITH REGULAR EXERCISE.  I  ALSO STRESSED THE IMPORTANCE OF ANNUAL EYE EXAMS, SELF FOOT CARE AND COMPLIANCE WITH OFFICE VISITS.

## 2023-08-22 ENCOUNTER — Encounter: Payer: Self-pay | Admitting: Internal Medicine

## 2023-08-26 ENCOUNTER — Other Ambulatory Visit (HOSPITAL_COMMUNITY): Payer: Self-pay

## 2023-09-04 ENCOUNTER — Other Ambulatory Visit (HOSPITAL_COMMUNITY): Payer: Self-pay

## 2023-09-15 ENCOUNTER — Other Ambulatory Visit (HOSPITAL_COMMUNITY): Payer: Self-pay

## 2023-09-24 ENCOUNTER — Encounter: Payer: Self-pay | Admitting: Internal Medicine

## 2023-10-27 ENCOUNTER — Other Ambulatory Visit: Payer: Self-pay

## 2023-10-27 ENCOUNTER — Other Ambulatory Visit (HOSPITAL_COMMUNITY): Payer: Self-pay

## 2023-10-31 ENCOUNTER — Encounter: Payer: Self-pay | Admitting: Advanced Practice Midwife

## 2023-11-25 ENCOUNTER — Other Ambulatory Visit: Payer: Self-pay

## 2023-12-19 ENCOUNTER — Other Ambulatory Visit: Payer: Self-pay | Admitting: Internal Medicine

## 2023-12-19 ENCOUNTER — Other Ambulatory Visit (HOSPITAL_COMMUNITY): Payer: Self-pay

## 2023-12-19 DIAGNOSIS — E1169 Type 2 diabetes mellitus with other specified complication: Secondary | ICD-10-CM

## 2023-12-21 ENCOUNTER — Other Ambulatory Visit (HOSPITAL_COMMUNITY): Payer: Self-pay

## 2023-12-22 ENCOUNTER — Other Ambulatory Visit (HOSPITAL_COMMUNITY): Payer: Self-pay

## 2023-12-22 ENCOUNTER — Other Ambulatory Visit: Payer: Self-pay

## 2023-12-22 MED ORDER — MOUNJARO 15 MG/0.5ML ~~LOC~~ SOAJ
15.0000 mg | SUBCUTANEOUS | 3 refills | Status: DC
  Filled 2023-12-22: qty 2, 28d supply, fill #0

## 2023-12-26 ENCOUNTER — Other Ambulatory Visit (HOSPITAL_COMMUNITY): Payer: Self-pay

## 2023-12-26 ENCOUNTER — Other Ambulatory Visit: Payer: Self-pay | Admitting: Internal Medicine

## 2023-12-26 ENCOUNTER — Other Ambulatory Visit: Payer: Self-pay

## 2023-12-26 DIAGNOSIS — F5101 Primary insomnia: Secondary | ICD-10-CM

## 2023-12-26 MED ORDER — DAYVIGO 10 MG PO TABS
1.0000 | ORAL_TABLET | Freq: Every evening | ORAL | 2 refills | Status: DC | PRN
  Filled 2023-12-26: qty 30, 30d supply, fill #0
  Filled 2024-02-02: qty 30, 30d supply, fill #1
  Filled 2024-03-08: qty 30, 30d supply, fill #2

## 2024-01-08 ENCOUNTER — Other Ambulatory Visit (HOSPITAL_COMMUNITY): Payer: Self-pay

## 2024-01-08 ENCOUNTER — Encounter: Payer: Self-pay | Admitting: Internal Medicine

## 2024-01-08 ENCOUNTER — Ambulatory Visit: Admitting: Internal Medicine

## 2024-01-08 VITALS — BP 124/82 | HR 94 | Temp 98.1°F | Ht 62.0 in | Wt 180.8 lb

## 2024-01-08 DIAGNOSIS — J301 Allergic rhinitis due to pollen: Secondary | ICD-10-CM

## 2024-01-08 DIAGNOSIS — E66811 Obesity, class 1: Secondary | ICD-10-CM

## 2024-01-08 DIAGNOSIS — Z6833 Body mass index (BMI) 33.0-33.9, adult: Secondary | ICD-10-CM

## 2024-01-08 DIAGNOSIS — F5101 Primary insomnia: Secondary | ICD-10-CM

## 2024-01-08 DIAGNOSIS — I10 Essential (primary) hypertension: Secondary | ICD-10-CM

## 2024-01-08 DIAGNOSIS — E1169 Type 2 diabetes mellitus with other specified complication: Secondary | ICD-10-CM

## 2024-01-08 DIAGNOSIS — E7800 Pure hypercholesterolemia, unspecified: Secondary | ICD-10-CM

## 2024-01-08 DIAGNOSIS — E6609 Other obesity due to excess calories: Secondary | ICD-10-CM

## 2024-01-08 DIAGNOSIS — Z23 Encounter for immunization: Secondary | ICD-10-CM | POA: Diagnosis not present

## 2024-01-08 LAB — CMP14+EGFR
ALT: 15 IU/L (ref 0–32)
AST: 16 IU/L (ref 0–40)
Albumin: 4.5 g/dL (ref 3.8–4.9)
Alkaline Phosphatase: 78 IU/L (ref 49–135)
BUN/Creatinine Ratio: 18 (ref 9–23)
BUN: 14 mg/dL (ref 6–24)
Bilirubin Total: 0.4 mg/dL (ref 0.0–1.2)
CO2: 23 mmol/L (ref 20–29)
Calcium: 9.3 mg/dL (ref 8.7–10.2)
Chloride: 104 mmol/L (ref 96–106)
Creatinine, Ser: 0.78 mg/dL (ref 0.57–1.00)
Globulin, Total: 2.3 g/dL (ref 1.5–4.5)
Glucose: 80 mg/dL (ref 70–99)
Potassium: 4.6 mmol/L (ref 3.5–5.2)
Sodium: 142 mmol/L (ref 134–144)
Total Protein: 6.8 g/dL (ref 6.0–8.5)
eGFR: 90 mL/min/1.73 (ref >59)

## 2024-01-08 LAB — LIPID PANEL
Chol/HDL Ratio: 2.3 ratio (ref 0.0–4.4)
Cholesterol, Total: 191 mg/dL (ref 100–199)
HDL: 83 mg/dL (ref >39)
LDL Chol Calc (NIH): 99 mg/dL (ref 0–99)
Triglycerides: 49 mg/dL (ref 0–149)
VLDL Cholesterol Cal: 9 mg/dL (ref 5–40)

## 2024-01-08 LAB — HEMOGLOBIN A1C
Est. average glucose Bld gHb Est-mCnc: 114 mg/dL
Hgb A1c MFr Bld: 5.6 % (ref 4.8–5.6)

## 2024-01-08 MED ORDER — TRAZODONE HCL 50 MG PO TABS
50.0000 mg | ORAL_TABLET | Freq: Every day | ORAL | 1 refills | Status: DC
  Filled 2024-01-08: qty 90, 90d supply, fill #0
  Filled 2024-04-04: qty 90, 90d supply, fill #1

## 2024-01-08 MED ORDER — VALSARTAN 160 MG PO TABS
160.0000 mg | ORAL_TABLET | Freq: Every day | ORAL | 1 refills | Status: AC
  Filled 2024-01-08: qty 90, 90d supply, fill #0

## 2024-01-08 MED ORDER — NOREL AD 4-10-325 MG PO TABS
1.0000 | ORAL_TABLET | Freq: Two times a day (BID) | ORAL | 0 refills | Status: AC | PRN
  Filled 2024-01-08: qty 20, 10d supply, fill #0

## 2024-01-08 MED ORDER — MOUNJARO 15 MG/0.5ML ~~LOC~~ SOAJ
15.0000 mg | SUBCUTANEOUS | 3 refills | Status: DC
  Filled 2024-01-08 – 2024-01-16 (×3): qty 2, 28d supply, fill #0
  Filled 2024-02-14: qty 2, 28d supply, fill #1
  Filled 2024-03-08 – 2024-03-10 (×2): qty 2, 28d supply, fill #2
  Filled 2024-04-13: qty 2, 28d supply, fill #3

## 2024-01-08 MED ORDER — LEVOCETIRIZINE DIHYDROCHLORIDE 5 MG PO TABS
5.0000 mg | ORAL_TABLET | Freq: Every evening | ORAL | 2 refills | Status: AC
  Filled 2024-01-08 – 2024-04-04 (×2): qty 90, 90d supply, fill #0

## 2024-01-08 NOTE — Patient Instructions (Signed)

## 2024-01-08 NOTE — Assessment & Plan Note (Signed)
 Chronic, controlled.  She admits that she only takes meds MWF, daily use causes her She will continue with valsartan  160mg  daily. She is reminded to follow a low sodium diet.

## 2024-01-08 NOTE — Progress Notes (Signed)
 I,[name], CMA,acting as a Neurosurgeon for [name], MD.,have documented all relevant documentation on the behalf of [name], MD,as directed by  [name], MD while in the presence of [name], MD.  Subjective:  Patient ID: Sue Bell , female    DOB: 12-Sep-1970 , 53 y.o.   MRN: [id number]  Chief Complaint  Patient presents with   Diabetes    Patient presents today for a BP & DM check, patient reports compliance with medications. Denies headache, chest pain & sob.  She states with takiing Davigo she does not have a hard time going to sleep but she does not stay asleep. She takes medication 30 minutes to an hour before.    Hypertension    HPI Discussed the use of AI scribe software for clinical note transcription with the patient, who gave verbal consent to proceed.  History of Present Illness ROSAMAE ROCQUE is a 53 year old female with diabetes and hypertension who presents for a routine follow-up and medication review.  She is currently taking atorvastatin  40 mg for cholesterol management, administered at night on Monday, Wednesday, and Friday. She also uses Xyzal  as needed and typically purchases Nasonex  over the counter, using it infrequently.  She has issues with her current sleep medication, Dayvigo . It helps her fall asleep within 30 minutes, but she wakes up at 3 AM and struggles to return to sleep. She wants to try a different medication.  She is on Mounjaro  for weight management and aims to lose 30 more pounds. She prefers to have her medications filled at the [facility] pharmacy as she spends most of her time in that area.   Diabetes She presents for her follow-up diabetic visit. She has type 2 diabetes mellitus. Her disease course has been stable. There are no hypoglycemic associated symptoms. Pertinent negatives for hypoglycemia include no headaches. Pertinent negatives for diabetes include no blurred vision and no chest pain. There are no  hypoglycemic complications. There are no diabetic complications. Risk factors for coronary artery disease include diabetes mellitus, dyslipidemia, obesity and sedentary lifestyle. She is following a diabetic diet. She participates in exercise intermittently.  Hypertension This is a chronic problem. The current episode started more than 1 year ago. The problem has been gradually improving since onset. The problem is controlled. Pertinent negatives include no blurred vision, chest pain, headaches, palpitations or shortness of breath. Risk factors for coronary artery disease include obesity, diabetes mellitus and dyslipidemia. Past treatments include angiotensin blockers. The current treatment provides moderate improvement. Compliance problems include exercise.      Past Medical History:  Diagnosis Date   Diabetes mellitus without complication (HCC)    Hyperlipidemia    Hypertension    OSA (obstructive sleep apnea) 07/08/2016     Family History  Problem Relation Age of Onset   Heart disease Mother    Diabetes Father    Cancer Father    Breast cancer Sister      Current Outpatient Medications:    atorvastatin  (LIPITOR) 40 MG tablet, Take 1 tablet (40 mg total) by mouth daily., Disp: 90 tablet, Rfl: 2   diazepam  (VALIUM ) 5 MG tablet, Take 1 tablet (5 mg total) by mouth every 12 (twelve) hours as needed for anxiety (flight anxiety)., Disp: 10 tablet, Rfl: 0   Lemborexant  (DAYVIGO ) 10 MG TABS, Take 1 tablet (10 mg total) by mouth at bedtime as needed., Disp: 30 tablet, Rfl: 2   mometasone  (NASONEX ) 50 MCG/ACT nasal  spray, Place 1 spray into the nose daily., Disp: 17 g, Rfl: 3   traZODone  (DESYREL ) 50 MG tablet, Take 1 tablet (50 mg total) by mouth at bedtime., Disp: 90 tablet, Rfl: 1   Chlorphen-PE-Acetaminophen  (NOREL AD) 4-10-325 MG TABS, Take 1 tablet by mouth 2 (two) times daily as needed., Disp: 20 tablet, Rfl: 0   levocetirizine (XYZAL ) 5 MG tablet, Take 1 tablet (5 mg total) by mouth  every evening., Disp: 90 tablet, Rfl: 2   tirzepatide  (MOUNJARO ) 15 MG/0.5ML Pen, Inject 15 mg into the skin once a week., Disp: 2 mL, Rfl: 3   valsartan  (DIOVAN ) 160 MG tablet, Take 1 tablet (160 mg total) by mouth daily., Disp: 90 tablet, Rfl: 1   Allergies  Allergen Reactions   Sulfa Antibiotics Hives     Review of Systems  Constitutional: Negative.   Eyes:  Negative for blurred vision.  Respiratory: Negative.  Negative for shortness of breath.   Cardiovascular: Negative.  Negative for chest pain and palpitations.  Gastrointestinal: Negative.   Neurological: Negative.  Negative for headaches.  Psychiatric/Behavioral: Negative.       Today's Vitals   01/08/24 0847  BP: 124/82  Pulse: 94  Temp: 98.1 F (36.7 C)  SpO2: 98%  Weight: 180 lb 12.8 oz (82 kg)  Height: 5' 2 (1.575 m)   Body mass index is 33.07 kg/m.  Wt Readings from Last 3 Encounters:  01/08/24 180 lb 12.8 oz (82 kg)  08/18/23 178 lb 9.6 oz (81 kg)  06/10/23 182 lb 6.4 oz (82.7 kg)     Objective:  Physical Exam Vitals and nursing note reviewed.  Constitutional:      Appearance: Normal appearance.  HENT:     Head: Normocephalic and atraumatic.  Eyes:     Extraocular Movements: Extraocular movements intact.  Cardiovascular:     Rate and Rhythm: Normal rate and regular rhythm.     Heart sounds: Normal heart sounds.  Pulmonary:     Effort: Pulmonary effort is normal.     Breath sounds: Normal breath sounds.  Musculoskeletal:     Cervical back: Normal range of motion.  Skin:    General: Skin is warm.  Neurological:     General: No focal deficit present.     Mental Status: She is alert.  Psychiatric:        Mood and Affect: Mood normal.        Behavior: Behavior normal.         Assessment And Plan:  Type 2 diabetes mellitus with hypercholesterolemia (HCC) Assessment & Plan: Type 2 diabetes mellitus, managed with Mounjaro  15mg  weekly.  She is currently taking atorvastatin  40mg  w/ MWF dosing.   - Renew Mounjaro  prescription. - Encourage healthy diet with adequate healthy fats. - Advise on regular exercise, including strength training twice a week.   Orders: -     CMP14+EGFR -     Lipid panel -     Hemoglobin A1c -     Mounjaro ; Inject 15 mg into the skin once a week.  Dispense: 2 mL; Refill: 3  Essential hypertension, benign Assessment & Plan: Chronic, controlled.  She admits that she only takes meds MWF, daily use causes her She will continue with valsartan  160mg  daily. She is reminded to follow a low sodium diet.   Orders: -     CMP14+EGFR -     Lipid panel -     Valsartan ; Take 1 tablet (160 mg total) by mouth daily.  Dispense:  90 tablet; Refill: 1  Primary insomnia Assessment & Plan: Insomnia, managed with Dayvigo , effective for sleep onset but not maintenance. Plan to transition to trazodone  to improve sleep maintenance. Discussed potential side effects, including rare headache. - Start trazodone , 90 tablets prescribed to allow for dose adjustment. - Continue Dayvigo  until trazodone  efficacy is assessed.   Seasonal allergic rhinitis due to pollen Assessment & Plan: Seasonal allergies, managed with Xyzal  and Norel as needed. Discussed use of Norel during travel. - Prescribe Norel for seasonal allergies and travel. - Prescribe Xyzal  as needed for allergies.  Orders: -     Levocetirizine Dihydrochloride ; Take 1 tablet (5 mg total) by mouth every evening.  Dispense: 90 tablet; Refill: 2  Class 1 obesity due to excess calories with serious comorbidity and body mass index (BMI) of 33.0 to 33.9 in adult Assessment & Plan: Obesity, class 1, with a goal to lose 30 pounds. - Encourage weight loss through diet and exercise. - Advise on strength training twice a week to prevent muscle atrophy.   Immunization due -     Flu vaccine trivalent PF, 6mos and older(Flulaval,Afluria,Fluarix,Fluzone)  Other orders -     traZODone  HCl; Take 1 tablet (50 mg total) by mouth at  bedtime.  Dispense: 90 tablet; Refill: 1 -     Norel AD; Take 1 tablet by mouth 2 (two) times daily as needed.  Dispense: 20 tablet; Refill: 0  Return in 6 weeks (on 02/19/2024), or virtual - trazodone  f/u, for 4 MONTH DM F/U.SABRA  Patient was given opportunity to ask questions. Patient verbalized understanding of the plan and was able to repeat key elements of the plan. All questions were answered to their satisfaction.   I, [name], MD, have reviewed all documentation for this visit. The documentation on 01/08/24 for the exam, diagnosis, procedures, and orders are all accurate and complete.   IF YOU HAVE BEEN REFERRED TO A SPECIALIST, IT MAY TAKE 1-2 WEEKS TO SCHEDULE/PROCESS THE REFERRAL. IF YOU HAVE NOT HEARD FROM US /SPECIALIST IN TWO WEEKS, PLEASE GIVE US  A CALL AT [phone].   THE PATIENT IS ENCOURAGED TO PRACTICE SOCIAL DISTANCING DUE TO THE COVID-19 PANDEMIC.

## 2024-01-12 ENCOUNTER — Ambulatory Visit: Payer: Self-pay | Admitting: Internal Medicine

## 2024-01-13 NOTE — Assessment & Plan Note (Signed)
 Insomnia, managed with Dayvigo , effective for sleep onset but not maintenance. Plan to transition to trazodone  to improve sleep maintenance. Discussed potential side effects, including rare headache. - Start trazodone , 90 tablets prescribed to allow for dose adjustment. - Continue Dayvigo  until trazodone  efficacy is assessed.

## 2024-01-13 NOTE — Assessment & Plan Note (Signed)
 Seasonal allergies, managed with Xyzal  and Norel as needed. Discussed use of Norel during travel. - Prescribe Norel for seasonal allergies and travel. - Prescribe Xyzal  as needed for allergies.

## 2024-01-13 NOTE — Assessment & Plan Note (Signed)
 Obesity, class 1, with a goal to lose 30 pounds. - Encourage weight loss through diet and exercise. - Advise on strength training twice a week to prevent muscle atrophy.

## 2024-01-13 NOTE — Assessment & Plan Note (Signed)
 Type 2 diabetes mellitus, managed with Mounjaro  15mg  weekly.  She is currently taking atorvastatin  40mg  w/ MWF dosing.  - Renew Mounjaro  prescription. - Encourage healthy diet with adequate healthy fats. - Advise on regular exercise, including strength training twice a week.

## 2024-01-14 ENCOUNTER — Ambulatory Visit: Admitting: Dermatology

## 2024-01-14 ENCOUNTER — Encounter: Payer: Self-pay | Admitting: Dermatology

## 2024-01-14 ENCOUNTER — Other Ambulatory Visit (HOSPITAL_COMMUNITY): Payer: Self-pay

## 2024-01-14 VITALS — BP 152/102

## 2024-01-14 DIAGNOSIS — L821 Other seborrheic keratosis: Secondary | ICD-10-CM | POA: Diagnosis not present

## 2024-01-14 MED ORDER — TRETINOIN 0.025 % EX CREA
TOPICAL_CREAM | CUTANEOUS | 2 refills | Status: AC
  Filled 2024-01-14: qty 45, 30d supply, fill #0

## 2024-01-14 NOTE — Progress Notes (Signed)
   New Patient Visit   Subjective  Sue Bell is a 53 y.o. female who presents for a NEW PATIENT appointment to be examined for the concerns as listed below.   Spot Check: Pt has multiple lesions on the face, neck & vagina area that she would like evaluated that has presented 10 years ago. They are not bothersome but she would like to know what they are and potential treatments.   Add On: Facial regimen product recommendations.   Are you nursing, pregnant or trying to conceive? No    No Hx of Bx.  No family Hx of skin cancer.   The following portions of the chart were reviewed this encounter and updated as appropriate: medications, allergies, medical history  Review of Systems:  No other skin or systemic complaints except as noted in HPI or Assessment and Plan.  Objective  Well appearing patient in no apparent distress; mood and affect are within normal limits.   A focused examination was performed of the following areas: face, neck & vagina   Relevant exam findings are noted in the Assessment and Plan.          Assessment & Plan   Seborrheic keratosis (Linear)  Benign growths on the face and neck and groin, identified as seborrheic keratosis. The growths are flat, present for a couple of years, more common in people of color, and hereditary. The condition is cosmetic, and removal is not covered by insurance. Removal methods such as freezing or heat can cause hyperpigmentation, which may take months to resolve.  - Prescribe tretinoin 0.025% cream to be applied Monday, Wednesday, and Friday nights. - Recommend Eucerin Radiant Tone to be applied morning and night. - Advise using Avene Cicalfate moisturizer after tretinoin and Eucerin Radiant Tone. - Suggest using The Ordinary glycolic acid toner for exfoliation on Saturdays. - Educate on application technique: use a pea-sized amount of tretinoin, avoid sensitive areas like corners of the eyes and lips. - Discuss  potential side effects: stinging, burning, irritation, and hyperpigmentation. - Advise monitoring skin tolerance and adjusting frequency of tretinoin application as needed. - Inform that it may take up to six months to see significant changes.    Return in about 6 months (around 07/14/2024) for SK/DPN.   Documentation: I have reviewed the above documentation for accuracy and completeness, and I agree with the above.  I, Shirron Maranda, CMA, am acting as scribe for [employer], DO.   Delon Lenis, DO

## 2024-01-14 NOTE — Patient Instructions (Addendum)
 VISIT SUMMARY:  Today, we discussed the growths on your face and inner thigh. These growths have been identified as seborrheic keratosis, also known as dermatosis papulosa nigra. We reviewed your family history and the nature of these growths, and we have developed a treatment plan to help manage them.  YOUR PLAN:  -SEBORRHEIC KERATOSIS (LINEAR):  Seborrheic keratosis is a common, benign skin growth that often appears as small, dark, and slightly raised spots. It is hereditary and more common in people of color. While these growths are harmless, they can be treated for cosmetic reasons.  We have prescribed tretinoin 0.025% cream to be applied on Monday, Wednesday, and Friday nights.  Additionally, you should use Eucerin Radiant Tone in the morning and at night, and Avene Cicalfate moisturizer after applying tretinoin and Eucerin Radiant Tone.  For exfoliation, use The Ordinary glycolic acid toner on Saturdays.  Apply a pea-sized amount of tretinoin and avoid sensitive areas like the corners of your eyes and lips. Be aware of potential side effects such as stinging, burning, irritation, and hyperpigmentation. Monitor your skin's tolerance and adjust the frequency of tretinoin application if needed. It may take up to six months to see significant changes.  INSTRUCTIONS:  Please follow the prescribed treatment plan and monitor your skin's response. If you experience any severe side effects or have concerns, schedule a follow-up appointment. It may take up to six months to see significant changes, so be patient and consistent with the treatment.   Important Information  Due to recent changes in healthcare laws, you may see results of your pathology and/or laboratory studies on MyChart before the doctors have had a chance to review them. We understand that in some cases there may be results that are confusing or concerning to you. Please understand that not all results are received at the same time  and often the doctors may need to interpret multiple results in order to provide you with the best plan of care or course of treatment. Therefore, we ask that you please give us  2 business days to thoroughly review all your results before contacting the office for clarification. Should we see a critical lab result, you will be contacted sooner.   If You Need Anything After Your Visit  If you have any questions or concerns for your doctor, please call our main line at [phone] If no one answers, please leave a voicemail as directed and we will return your call as soon as possible. Messages left after 4 pm will be answered the following business day.   You may also send us  a message via MyChart. We typically respond to MyChart messages within 1-2 business days.  For prescription refills, please ask your pharmacy to contact our office. Our fax number is [phone].  If you have an urgent issue when the clinic is closed that cannot wait until the next business day, you can page your doctor at the number below.    Please note that while we do our best to be available for urgent issues outside of office hours, we are not available 24/7.   If you have an urgent issue and are unable to reach us , you may choose to seek medical care at your doctor's office, retail clinic, urgent care center, or emergency room.  If you have a medical emergency, please immediately call 911 or go to the emergency department. In the event of inclement weather, please call our main line at [phone] for an update on the status of any  delays or closures.  Dermatology Medication Tips: Please keep the boxes that topical medications come in in order to help keep track of the instructions about where and how to use these. Pharmacies typically print the medication instructions only on the boxes and not directly on the medication tubes.   If your medication is too expensive, please contact our office at [phone] or  send us  a message through MyChart.   We are unable to tell what your co-pay for medications will be in advance as this is different depending on your insurance coverage. However, we may be able to find a substitute medication at lower cost or fill out paperwork to get insurance to cover a needed medication.   If a prior authorization is required to get your medication covered by your insurance company, please allow us  1-2 business days to complete this process.  Drug prices often vary depending on where the prescription is filled and some pharmacies may offer cheaper prices.  The website www.goodrx.com contains coupons for medications through different pharmacies. The prices here do not account for what the cost may be with help from insurance (it may be cheaper with your insurance), but the website can give you the price if you did not use any insurance.  - You can print the associated coupon and take it with your prescription to the pharmacy.  - You may also stop by our office during regular business hours and pick up a GoodRx coupon card.  - If you need your prescription sent electronically to a different pharmacy, notify our office through [facility] or by phone at [phone]

## 2024-01-15 ENCOUNTER — Other Ambulatory Visit (HOSPITAL_COMMUNITY): Payer: Self-pay

## 2024-01-16 ENCOUNTER — Other Ambulatory Visit: Payer: Self-pay

## 2024-01-16 ENCOUNTER — Other Ambulatory Visit (HOSPITAL_COMMUNITY): Payer: Self-pay

## 2024-01-21 ENCOUNTER — Other Ambulatory Visit: Payer: Self-pay

## 2024-01-21 ENCOUNTER — Telehealth (HOSPITAL_COMMUNITY): Payer: Self-pay | Admitting: Pharmacy Technician

## 2024-01-21 ENCOUNTER — Other Ambulatory Visit (HOSPITAL_COMMUNITY): Payer: Self-pay

## 2024-01-21 ENCOUNTER — Telehealth (HOSPITAL_COMMUNITY): Payer: Self-pay

## 2024-01-21 NOTE — Telephone Encounter (Signed)
 Pharmacy Patient Advocate Encounter  Received notification from [facility] that Prior Authorization for Tretinoin 0.025% cream  has been DENIED.  See denial reason below. No denial letter attached in CMM. Will attach denial letter to Media tab once received.   PA #/Case ID/Reference #: [id number]     *the price at the [facility] is $190.19 but she can get much cheaper using a good rx, unfortunately the community pharmacies do not accept that, she would have to get it transferred, sent a message to the pharmacy to reach out to the patient and see how she would like to proceed.

## 2024-01-21 NOTE — Telephone Encounter (Signed)
 PA request has been Denied. New Encounter has been or will be created for follow up. For additional info see Pharmacy Prior Auth telephone encounter from 01/21/24. Can you please call the patient and let her know the price at [facility] is $190.19 and see how she would like to proceed. Can get much cheaper using Good Rx but she would have to go to a different pharmacy as we do not accept that. See below for a list of pharmacies with an approximate price.

## 2024-01-21 NOTE — Telephone Encounter (Signed)
 PA request has been Received. New Encounter has been or will be created for follow up. For additional info see Pharmacy Prior Auth telephone encounter from 01/21/24.

## 2024-01-21 NOTE — Telephone Encounter (Signed)
 Pharmacy Patient Advocate Encounter   Received notification from Pt Calls Messages that prior authorization for Tretinoin 0.025% cream  is required/requested.   Insurance verification completed.   The patient is insured through [facility].   Per test claim: PA required; PA submitted to above mentioned insurance via Latent Key/confirmation #/EOC [facility] Status is pending

## 2024-01-22 ENCOUNTER — Other Ambulatory Visit (HOSPITAL_COMMUNITY): Payer: Self-pay

## 2024-02-18 ENCOUNTER — Other Ambulatory Visit: Payer: Self-pay | Admitting: Medical Genetics

## 2024-02-23 ENCOUNTER — Telehealth (INDEPENDENT_AMBULATORY_CARE_PROVIDER_SITE_OTHER): Payer: Self-pay | Admitting: Internal Medicine

## 2024-02-23 VITALS — Temp 98.2°F | Ht 62.0 in

## 2024-02-23 DIAGNOSIS — L821 Other seborrheic keratosis: Secondary | ICD-10-CM | POA: Insufficient documentation

## 2024-02-23 DIAGNOSIS — F5101 Primary insomnia: Secondary | ICD-10-CM | POA: Diagnosis not present

## 2024-02-23 NOTE — Assessment & Plan Note (Signed)
 Chronic, she was started on trazodone  50mg  at bedtime at her last visit. However, it is not effective.  - Increase dose to 100mg  nightly - She is encouraged to give me a update in 2 weeks to let me know how this is working for her.

## 2024-02-23 NOTE — Progress Notes (Signed)
 Virtual Visit via Video Note  I,Johna Kearl T Donyelle Enyeart, CMA,acting as a neurosurgeon for Sue LOISE Slocumb, MD.,have documented all relevant documentation on the behalf of Sue LOISE Slocumb, MD,as directed by  Sue LOISE Slocumb, MD while in the presence of Sue LOISE Slocumb, MD.  I connected with Sue Sue on 02/23/24 at  4:20 PM EST by a video enabled telemedicine application and verified that I am speaking with the correct person using two identifiers.  Patient Location: Home Provider Location: Home Office  I discussed the limitations, risks, security, and privacy concerns of performing an evaluation and management service by video and the availability of in person appointments. I also discussed with the patient that there may be a patient responsible charge related to this service. The patient expressed understanding and agreed to proceed.  Subjective: PCP: Sue Catheryn, MD  Chief Complaint  Patient presents with   Med Check    Patient presents today for Trazodone  follow up. She reports compliance with medications. Denies headache, chest pain & sob.    She presents today for f/u insomnia.  She was prescribed trazodone  at her last visit.  She does not have issue with falling asleep. However, she is still waking up in the middle of the night.  She does not have any grogginess while on the medication. She is happy about this!  She does report having a bedtime routine.    She wants to know if the dose can be adjusted.      ROS: Per HPI  Current Outpatient Medications:    atorvastatin  (LIPITOR) 40 MG tablet, Take 1 tablet (40 mg total) by mouth daily., Disp: 90 tablet, Rfl: 2   Chlorphen-PE-Acetaminophen  (NOREL AD) 4-10-325 MG TABS, Take 1 tablet by mouth 2 (two) times daily as needed., Disp: 20 tablet, Rfl: 0   diazepam  (VALIUM ) 5 MG tablet, Take 1 tablet (5 mg total) by mouth every 12 (twelve) hours as needed for anxiety (flight anxiety)., Disp: 10 tablet, Rfl: 0   Lemborexant  (DAYVIGO ) 10  MG TABS, Take 1 tablet (10 mg total) by mouth at bedtime as needed., Disp: 30 tablet, Rfl: 2   levocetirizine (XYZAL ) 5 MG tablet, Take 1 tablet (5 mg total) by mouth every evening., Disp: 90 tablet, Rfl: 2   mometasone  (NASONEX ) 50 MCG/ACT nasal spray, Place 1 spray into the nose daily., Disp: 17 g, Rfl: 3   tirzepatide  (MOUNJARO ) 15 MG/0.5ML Pen, Inject 15 mg into the skin once a week., Disp: 2 mL, Rfl: 3   traZODone  (DESYREL ) 50 MG tablet, Take 1 tablet (50 mg total) by mouth at bedtime., Disp: 90 tablet, Rfl: 1   tretinoin (RETIN-A) 0.025 % cream, apply pea size amount to face 3 nights a week on MWF. Decrease to 1-2 nights if needed due to weather changed., Disp: 45 g, Rfl: 2   valsartan  (DIOVAN ) 160 MG tablet, Take 1 tablet (160 mg total) by mouth daily., Disp: 90 tablet, Rfl: 1  Observations/Objective: Today's Vitals   02/23/24 1404  Temp: 98.2 F (36.8 C)  SpO2: 98%  Height: 5' 2 (1.575 m)   Physical Exam Vitals and nursing note reviewed.  Constitutional:      Appearance: Normal appearance.  HENT:     Head: Normocephalic and atraumatic.  Eyes:     Extraocular Movements: Extraocular movements intact.  Cardiovascular:     Rate and Rhythm: Normal rate and regular rhythm.  Pulmonary:     Effort: Pulmonary effort is normal.  Musculoskeletal:  Cervical back: Normal range of motion.  Neurological:     General: No focal deficit present.     Mental Status: She is alert.  Psychiatric:        Mood and Affect: Mood normal.        Behavior: Behavior normal.     Assessment and Plan: Primary insomnia Assessment & Plan: Chronic, she was started on trazodone  50mg  at bedtime at her last visit. However, it is not effective.  - Increase dose to 100mg  nightly - She is encouraged to give me a update in 2 weeks to let me know how this is working for her.    Seborrheic keratoses Assessment & Plan: Most recent Derm notes reviewed.  - Continue with meds as per Derm.       Follow Up Instructions: Return if symptoms worsen or fail to improve.   I discussed the assessment and treatment plan with the patient. The patient was provided an opportunity to ask questions, and all were answered. The patient agreed with the plan and demonstrated an understanding of the instructions.   The patient was advised to call back or seek an in-person evaluation if the symptoms worsen or if the condition fails to improve as anticipated.  The above assessment and management plan was discussed with the patient. The patient verbalized understanding of and has agreed to the management plan.   I, Sue LOISE Slocumb, MD, have reviewed all documentation for this visit. The documentation on 02/23/24 for the exam, diagnosis, procedures, and orders are all accurate and complete.

## 2024-02-23 NOTE — Patient Instructions (Signed)
 Insomnia Insomnia is a sleep disorder that makes it difficult to fall asleep or stay asleep. Insomnia can cause fatigue, low energy, difficulty concentrating, mood swings, and poor performance at work or school. There are three different ways to classify insomnia: Difficulty falling asleep. Difficulty staying asleep. Waking up too early in the morning. Any type of insomnia can be long-term (chronic) or short-term (acute). Both are common. Short-term insomnia usually lasts for 3 months or less. Chronic insomnia occurs at least three times a week for longer than 3 months. What are the causes? Insomnia may be caused by another condition, situation, or substance, such as: Having certain mental health conditions, such as anxiety and depression. Using caffeine, alcohol , tobacco, or drugs. Having gastrointestinal conditions, such as gastroesophageal reflux disease (GERD). Having certain medical conditions. These include: Asthma. Alzheimer's disease. Stroke. Chronic pain. An overactive thyroid  gland (hyperthyroidism). Other sleep disorders, such as restless legs syndrome and sleep apnea. Menopause. Sometimes, the cause of insomnia may not be known. What increases the risk? Risk factors for insomnia include: Gender. Females are affected more often than males. Age. Insomnia is more common as people get older. Stress and certain medical and mental health conditions. Lack of exercise. Having an irregular work schedule. This may include working night shifts and traveling between different time zones. What are the signs or symptoms? If you have insomnia, the main symptom is having trouble falling asleep or having trouble staying asleep. This may lead to other symptoms, such as: Feeling tired or having low energy. Feeling nervous about going to sleep. Not feeling rested in the morning. Having trouble concentrating. Feeling irritable, anxious, or depressed. How is this diagnosed? This condition  may be diagnosed based on: Your symptoms and medical history. Your health care provider may ask about: Your sleep habits. Any medical conditions you have. Your mental health. A physical exam. How is this treated? Treatment for insomnia depends on the cause. Treatment may focus on treating an underlying condition that is causing the insomnia. Treatment may also include: Medicines to help you sleep. Counseling or therapy. Lifestyle adjustments to help you sleep better. Follow these instructions at home: Eating and drinking  Limit or avoid alcohol , caffeinated beverages, and products that contain nicotine and tobacco, especially close to bedtime. These can disrupt your sleep. Do not eat a large meal or eat spicy foods right before bedtime. This can lead to digestive discomfort that can make it hard for you to sleep. Sleep habits  Keep a sleep diary to help you and your health care provider figure out what could be causing your insomnia. Write down: When you sleep. When you wake up during the night. How well you sleep and how rested you feel the next day. Any side effects of medicines you are taking. What you eat and drink. Make your bedroom a dark, comfortable place where it is easy to fall asleep. Put up shades or blackout curtains to block light from outside. Use a white noise machine to block noise. Keep the temperature cool. Limit screen use before bedtime. This includes: Not watching TV. Not using your smartphone, tablet, or computer. Stick to a routine that includes going to bed and waking up at the same times every day and night. This can help you fall asleep faster. Consider making a quiet activity, such as reading, part of your nighttime routine. Try to avoid taking naps during the day so that you sleep better at night. Get out of bed if you are still awake after  15 minutes of trying to sleep. Keep the lights down, but try reading or doing a quiet activity. When you feel  sleepy, go back to bed. General instructions Take over-the-counter and prescription medicines only as told by your health care provider. Exercise regularly as told by your health care provider. However, avoid exercising in the hours right before bedtime. Use relaxation techniques to manage stress. Ask your health care provider to suggest some techniques that may work well for you. These may include: Breathing exercises. Routines to release muscle tension. Visualizing peaceful scenes. Make sure that you drive carefully. Do not drive if you feel very sleepy. Keep all follow-up visits. This is important. Contact a health care provider if: You are tired throughout the day. You have trouble in your daily routine due to sleepiness. You continue to have sleep problems, or your sleep problems get worse. Get help right away if: You have thoughts about hurting yourself or someone else. Get help right away if you feel like you may hurt yourself or others, or have thoughts about taking your own life. Go to your nearest emergency room or: Call 911. Call the National Suicide Prevention Lifeline at 2232757840 or 988. This is open 24 hours a day. Text the Crisis Text Line at 657-529-4371. Summary Insomnia is a sleep disorder that makes it difficult to fall asleep or stay asleep. Insomnia can be long-term (chronic) or short-term (acute). Treatment for insomnia depends on the cause. Treatment may focus on treating an underlying condition that is causing the insomnia. Keep a sleep diary to help you and your health care provider figure out what could be causing your insomnia. This information is not intended to replace advice given to you by your health care provider. Make sure you discuss any questions you have with your health care provider. Document Revised: 03/12/2021 Document Reviewed: 03/12/2021 Elsevier Patient Education  2024 [employer].

## 2024-02-23 NOTE — Assessment & Plan Note (Signed)
 Most recent Derm notes reviewed.  - Continue with meds as per Derm.

## 2024-02-24 ENCOUNTER — Other Ambulatory Visit

## 2024-03-08 ENCOUNTER — Encounter: Payer: Self-pay | Admitting: Internal Medicine

## 2024-03-09 ENCOUNTER — Other Ambulatory Visit: Payer: Self-pay

## 2024-03-09 ENCOUNTER — Other Ambulatory Visit (HOSPITAL_COMMUNITY): Payer: Self-pay

## 2024-03-09 LAB — OPHTHALMOLOGY REPORT-SCANNED

## 2024-04-05 ENCOUNTER — Other Ambulatory Visit (HOSPITAL_COMMUNITY): Payer: Self-pay

## 2024-04-05 ENCOUNTER — Other Ambulatory Visit: Payer: Self-pay

## 2024-04-14 ENCOUNTER — Other Ambulatory Visit (HOSPITAL_COMMUNITY): Payer: Self-pay

## 2024-05-12 ENCOUNTER — Encounter: Payer: Self-pay | Admitting: Internal Medicine

## 2024-05-12 ENCOUNTER — Other Ambulatory Visit (HOSPITAL_COMMUNITY): Payer: Self-pay

## 2024-05-12 ENCOUNTER — Ambulatory Visit: Payer: Self-pay | Admitting: Internal Medicine

## 2024-05-12 VITALS — BP 128/64 | HR 77 | Temp 98.4°F | Ht 62.0 in | Wt 175.8 lb

## 2024-05-12 DIAGNOSIS — I10 Essential (primary) hypertension: Secondary | ICD-10-CM

## 2024-05-12 DIAGNOSIS — F5101 Primary insomnia: Secondary | ICD-10-CM | POA: Diagnosis not present

## 2024-05-12 DIAGNOSIS — E66811 Obesity, class 1: Secondary | ICD-10-CM

## 2024-05-12 DIAGNOSIS — E7800 Pure hypercholesterolemia, unspecified: Secondary | ICD-10-CM | POA: Diagnosis not present

## 2024-05-12 DIAGNOSIS — E1169 Type 2 diabetes mellitus with other specified complication: Secondary | ICD-10-CM | POA: Diagnosis not present

## 2024-05-12 DIAGNOSIS — Z6832 Body mass index (BMI) 32.0-32.9, adult: Secondary | ICD-10-CM

## 2024-05-12 DIAGNOSIS — E6609 Other obesity due to excess calories: Secondary | ICD-10-CM

## 2024-05-12 MED ORDER — MOUNJARO 15 MG/0.5ML ~~LOC~~ SOAJ
15.0000 mg | SUBCUTANEOUS | 3 refills | Status: AC
  Filled 2024-05-12: qty 2, 28d supply, fill #0

## 2024-05-12 MED ORDER — TRAZODONE HCL 100 MG PO TABS
100.0000 mg | ORAL_TABLET | Freq: Every day | ORAL | 2 refills | Status: AC
  Filled 2024-05-12: qty 90, 90d supply, fill #0

## 2024-05-12 NOTE — Assessment & Plan Note (Addendum)
 Chronic, controlled.  She admits that she only takes meds MWF, daily use causes her She will continue with valsartan  160mg  daily. She is reminded to follow a low sodium diet.

## 2024-05-12 NOTE — Assessment & Plan Note (Addendum)
 Type 2 diabetes mellitus, managed with Mounjaro  15mg  weekly.  She is currently taking atorvastatin  40mg  w/ MWF dosing. Currently on Mounjaro  without recent blood glucose monitoring. - Provided glucose meter for blood glucose monitoring. - Renew Mounjaro  prescription. - Encourage healthy diet with adequate healthy fats. - Advise on regular exercise, including strength training twice a week.

## 2024-05-12 NOTE — Patient Instructions (Signed)

## 2024-05-12 NOTE — Assessment & Plan Note (Addendum)
 Chronic, improved with use of trazodone . Sleeps better with 100mg  dose.  - Send refill of trazodone  100mg  daily.

## 2024-05-12 NOTE — Progress Notes (Signed)
 I,[name], CMA,acting as a neurosurgeon for [name], MD.,have documented all relevant documentation on the behalf of [name], MD,as directed by  [name], MD while in the presence of [name], MD.  Subjective:  Patient ID: Sue Bell , female    DOB: Jun 19, 1970 , 54 y.o.   MRN: [id number]  Chief Complaint  Patient presents with   Hypertension    Patient presents today for a bp and dm follow up, Patient reports compliance with medication. Patient denies any chest pain, SOB, or headaches. Patient reports every night she has reflux, she reports it last about 5 mins.   Diabetes    HPI Discussed the use of AI scribe software for clinical note transcription with the patient, who gave verbal consent to proceed.  History of Present Illness Sue Bell is a 54 year old female who presents for a diabetes check.  She does not currently monitor her blood glucose levels and lacks a glucose meter. She is taking trazodone  and Mounjaro . She has previously discussed increasing her trazodone  dosage and has tried taking two tablets, which she prefers, but her recent refill was still for one tablet.  She takes valsartan  three times a week. She does not take it daily.  She reports that work is 'good' but 'a little different' due to changes in management, which has affected the work environment.   Diabetes She presents for her follow-up diabetic visit. She has type 2 diabetes mellitus. Her disease course has been stable. There are no hypoglycemic associated symptoms. Pertinent negatives for hypoglycemia include no headaches. Pertinent negatives for diabetes include no blurred vision and no chest pain. There are no hypoglycemic complications. There are no diabetic complications. Risk factors for coronary artery disease include diabetes mellitus, dyslipidemia, obesity and sedentary lifestyle. She is following a diabetic diet. She participates in exercise intermittently.   Hypertension This is a chronic problem. The current episode started more than 1 year ago. The problem has been gradually improving since onset. The problem is controlled. Pertinent negatives include no blurred vision, chest pain, headaches, palpitations or shortness of breath. Risk factors for coronary artery disease include obesity, diabetes mellitus and dyslipidemia. Past treatments include angiotensin blockers. The current treatment provides moderate improvement. Compliance problems include exercise.      Past Medical History:  Diagnosis Date   Allergy    Diabetes mellitus without complication (HCC)    Hyperlipidemia    Hypertension    OSA (obstructive sleep apnea) 07/08/2016     Family History  Problem Relation Age of Onset   Heart disease Mother    Kidney disease Mother    Diabetes Father    Cancer Father    Breast cancer Sister    Cancer Sister      Current Outpatient Medications:    atorvastatin  (LIPITOR) 40 MG tablet, Take 1 tablet (40 mg total) by mouth daily., Disp: 90 tablet, Rfl: 2   Chlorphen-PE-Acetaminophen  (NOREL AD) 4-10-325 MG TABS, Take 1 tablet by mouth 2 (two) times daily as needed., Disp: 20 tablet, Rfl: 0   diazepam  (VALIUM ) 5 MG tablet, Take 1 tablet (5 mg total) by mouth every 12 (twelve) hours as needed for anxiety (flight anxiety)., Disp: 10 tablet, Rfl: 0   levocetirizine (XYZAL ) 5 MG tablet, Take 1 tablet (5 mg total) by mouth every evening., Disp: 90 tablet, Rfl: 2   mometasone  (NASONEX ) 50 MCG/ACT nasal spray, Place 1 spray into the nose daily., Disp: 17 g,  Rfl: 3   traZODone  (DESYREL ) 100 MG tablet, Take 1 tablet (100 mg total) by mouth at bedtime., Disp: 90 tablet, Rfl: 2   tretinoin  (RETIN-A ) 0.025 % cream, apply pea size amount to face 3 nights a week on MWF. Decrease to 1-2 nights if needed due to weather changed., Disp: 45 g, Rfl: 2   valsartan  (DIOVAN ) 160 MG tablet, Take 1 tablet (160 mg total) by mouth daily., Disp: 90 tablet, Rfl: 1    tirzepatide  (MOUNJARO ) 15 MG/0.5ML Pen, Inject 15 mg into the skin once a week., Disp: 2 mL, Rfl: 3   Allergies  Allergen Reactions   Sulfa Antibiotics Hives     Review of Systems  Constitutional: Negative.   Eyes:  Negative for blurred vision.  Respiratory: Negative.  Negative for shortness of breath.   Cardiovascular: Negative.  Negative for chest pain and palpitations.  Gastrointestinal: Negative.   Neurological: Negative.  Negative for headaches.  Psychiatric/Behavioral: Negative.       Today's Vitals   05/12/24 1103  BP: 128/64  Pulse: 77  Temp: 98.4 F (36.9 C)  TempSrc: Oral  Weight: 175 lb 12.8 oz (79.7 kg)  Height: 5' 2 (1.575 m)  PainSc: 0-No pain   Body mass index is 32.15 kg/m.  Wt Readings from Last 3 Encounters:  05/12/24 175 lb 12.8 oz (79.7 kg)  01/08/24 180 lb 12.8 oz (82 kg)  08/18/23 178 lb 9.6 oz (81 kg)    The 10-year ASCVD risk score (Arnett DK, et al., 2019) is: 6.7%   Values used to calculate the score:     Age: 63 years     Clinically relevant sex: Female     Is Non-Hispanic African American: Yes     Diabetic: Yes     Tobacco smoker: No     Systolic Blood Pressure: 128 mmHg     Is BP treated: Yes     HDL Cholesterol: 83 mg/dL     Total Cholesterol: 191 mg/dL  Objective:  Physical Exam Vitals and nursing note reviewed.  Constitutional:      Appearance: Normal appearance. She is obese.  HENT:     Head: Normocephalic and atraumatic.  Eyes:     Extraocular Movements: Extraocular movements intact.  Cardiovascular:     Rate and Rhythm: Normal rate and regular rhythm.     Heart sounds: Normal heart sounds.  Pulmonary:     Effort: Pulmonary effort is normal.     Breath sounds: Normal breath sounds.  Musculoskeletal:     Cervical back: Normal range of motion.  Skin:    General: Skin is warm.  Neurological:     General: No focal deficit present.     Mental Status: She is alert.  Psychiatric:        Mood and Affect: Mood normal.         Behavior: Behavior normal.       Assessment And Plan:   Assessment & Plan Essential hypertension, benign Chronic, controlled.  She admits that she only takes meds MWF, daily use causes her She will continue with valsartan  160mg  daily. She is reminded to follow a low sodium diet.  Type 2 diabetes mellitus with hypercholesterolemia (HCC) Type 2 diabetes mellitus, managed with Mounjaro  15mg  weekly.  She is currently taking atorvastatin  40mg  w/ MWF dosing. Currently on Mounjaro  without recent blood glucose monitoring. - Provided glucose meter for blood glucose monitoring. - Renew Mounjaro  prescription. - Encourage healthy diet with adequate healthy fats. - Advise on  regular exercise, including strength training twice a week.  Primary insomnia Chronic, improved with use of trazodone . Sleeps better with 100mg  dose.  - Send refill of trazodone  100mg  daily.  Class 1 obesity due to excess calories with serious comorbidity and body mass index (BMI) of 32.0 to 32.9 in adult She is encouraged to strive for BMI less than 30 to decrease cardiac risk. Advised to aim for at least 150 minutes of exercise per week.     Orders Placed This Encounter  Procedures   Hemoglobin A1c   BMP8+eGFR   Return if symptoms worsen or fail to improve, for keep same next.  Patient was given opportunity to ask questions. Patient verbalized understanding of the plan and was able to repeat key elements of the plan. All questions were answered to their satisfaction.   I, [name], MD, have reviewed all documentation for this visit. The documentation on 05/12/24 for the exam, diagnosis, procedures, and orders are all accurate and complete.   IF YOU HAVE BEEN REFERRED TO A SPECIALIST, IT MAY TAKE 1-2 WEEKS TO SCHEDULE/PROCESS THE REFERRAL. IF YOU HAVE NOT HEARD FROM US /SPECIALIST IN TWO WEEKS, PLEASE GIVE US  A CALL AT [phone].

## 2024-05-13 ENCOUNTER — Other Ambulatory Visit (HOSPITAL_COMMUNITY): Payer: Self-pay

## 2024-05-13 LAB — BMP8+EGFR
BUN/Creatinine Ratio: 17 (ref 9–23)
BUN: 13 mg/dL (ref 6–24)
CO2: 22 mmol/L (ref 20–29)
Calcium: 9.3 mg/dL (ref 8.7–10.2)
Chloride: 105 mmol/L (ref 96–106)
Creatinine, Ser: 0.75 mg/dL (ref 0.57–1.00)
Glucose: 69 mg/dL — ABNORMAL LOW (ref 70–99)
Potassium: 4.5 mmol/L (ref 3.5–5.2)
Sodium: 143 mmol/L (ref 134–144)
eGFR: 95 mL/min/{1.73_m2}

## 2024-05-13 LAB — HEMOGLOBIN A1C
Est. average glucose Bld gHb Est-mCnc: 111 mg/dL
Hgb A1c MFr Bld: 5.5 % (ref 4.8–5.6)

## 2024-05-17 ENCOUNTER — Ambulatory Visit: Payer: Self-pay | Admitting: Internal Medicine

## 2024-05-20 ENCOUNTER — Other Ambulatory Visit: Payer: Self-pay | Admitting: Internal Medicine

## 2024-05-20 DIAGNOSIS — Z1231 Encounter for screening mammogram for malignant neoplasm of breast: Secondary | ICD-10-CM

## 2024-06-01 ENCOUNTER — Encounter

## 2024-06-01 DIAGNOSIS — Z1231 Encounter for screening mammogram for malignant neoplasm of breast: Secondary | ICD-10-CM

## 2024-07-15 ENCOUNTER — Ambulatory Visit: Admitting: Dermatology

## 2024-08-24 ENCOUNTER — Encounter: Payer: Self-pay | Admitting: Internal Medicine
